# Patient Record
Sex: Female | Born: 1940 | Race: White | Hispanic: No | Smoking: Never smoker
Health system: Southern US, Community
[De-identification: ages and names within clinical notes are randomized; demographics above are authoritative.]

## PROBLEM LIST (undated history)

## (undated) DIAGNOSIS — M51369 Other intervertebral disc degeneration, lumbar region without mention of lumbar back pain or lower extremity pain: Secondary | ICD-10-CM

## (undated) DIAGNOSIS — M5136 Other intervertebral disc degeneration, lumbar region: Secondary | ICD-10-CM

## (undated) DIAGNOSIS — M8580 Other specified disorders of bone density and structure, unspecified site: Secondary | ICD-10-CM

## (undated) DIAGNOSIS — E785 Hyperlipidemia, unspecified: Secondary | ICD-10-CM

## (undated) DIAGNOSIS — R55 Syncope and collapse: Secondary | ICD-10-CM

## (undated) DIAGNOSIS — I10 Essential (primary) hypertension: Secondary | ICD-10-CM

## (undated) HISTORY — DX: Hyperlipidemia, unspecified: E78.5

## (undated) HISTORY — DX: Other specified disorders of bone density and structure, unspecified site: M85.80

## (undated) HISTORY — DX: Other intervertebral disc degeneration, lumbar region without mention of lumbar back pain or lower extremity pain: M51.369

## (undated) HISTORY — DX: Syncope and collapse: R55

## (undated) HISTORY — PX: ABDOMINAL HYSTERECTOMY: SHX81

## (undated) HISTORY — DX: Other intervertebral disc degeneration, lumbar region: M51.36

---

## 1968-08-11 HISTORY — PX: BACK SURGERY: SHX140

## 1999-08-12 HISTORY — PX: COLONOSCOPY: SHX174

## 1999-08-12 LAB — HM COLONOSCOPY: HM Colonoscopy: NEGATIVE

## 2004-03-07 ENCOUNTER — Encounter: Admission: RE | Admit: 2004-03-07 | Discharge: 2004-03-07 | Payer: Self-pay | Admitting: Internal Medicine

## 2004-11-14 ENCOUNTER — Ambulatory Visit: Payer: Self-pay | Admitting: Internal Medicine

## 2004-12-16 ENCOUNTER — Ambulatory Visit: Payer: Self-pay | Admitting: Internal Medicine

## 2005-01-10 ENCOUNTER — Ambulatory Visit: Payer: Self-pay | Admitting: Internal Medicine

## 2005-05-09 ENCOUNTER — Ambulatory Visit: Payer: Self-pay | Admitting: Internal Medicine

## 2005-06-02 ENCOUNTER — Encounter: Admission: RE | Admit: 2005-06-02 | Discharge: 2005-06-02 | Payer: Self-pay | Admitting: Internal Medicine

## 2005-06-17 ENCOUNTER — Ambulatory Visit: Payer: Self-pay | Admitting: Internal Medicine

## 2006-06-04 ENCOUNTER — Encounter: Admission: RE | Admit: 2006-06-04 | Discharge: 2006-06-04 | Payer: Self-pay | Admitting: Internal Medicine

## 2006-06-04 LAB — HM MAMMOGRAPHY: HM Mammogram: NORMAL

## 2006-06-23 ENCOUNTER — Ambulatory Visit: Payer: Self-pay | Admitting: Internal Medicine

## 2006-06-25 ENCOUNTER — Ambulatory Visit: Payer: Self-pay | Admitting: Internal Medicine

## 2006-06-25 LAB — CONVERTED CEMR LAB
BUN: 14 mg/dL (ref 6–23)
Basophils Absolute: 0 10*3/uL (ref 0.0–0.1)
Basophils Relative: 0.7 % (ref 0.0–1.0)
Calcium: 9.3 mg/dL (ref 8.4–10.5)
Creatinine, Ser: 1 mg/dL (ref 0.4–1.2)
Eosinophil percent: 2.8 % (ref 0.0–5.0)
Folate: 14.4 ng/mL
Glucose, Bld: 109 mg/dL — ABNORMAL HIGH (ref 70–99)
HCT: 38.9 % (ref 36.0–46.0)
Hemoglobin: 13.5 g/dL (ref 12.0–15.0)
Iron: 61 ug/dL (ref 42–145)
Lymphocytes Relative: 32.5 % (ref 12.0–46.0)
MCHC: 34.6 g/dL (ref 30.0–36.0)
MCV: 92 fL (ref 78.0–100.0)
Magnesium: 2 mg/dL (ref 1.5–2.5)
Monocytes Absolute: 0.6 10*3/uL (ref 0.2–0.7)
Monocytes Relative: 9.1 % (ref 3.0–11.0)
Neutro Abs: 3.3 10*3/uL (ref 1.4–7.7)
Neutrophils Relative %: 54.9 % (ref 43.0–77.0)
Platelets: 366 10*3/uL (ref 150–400)
Potassium: 4.2 meq/L (ref 3.5–5.1)
RBC: 4.23 M/uL (ref 3.87–5.11)
RDW: 11.9 % (ref 11.5–14.6)
Saturation Ratios: 21.2 % (ref 20.0–50.0)
Sodium: 142 meq/L (ref 135–145)
Transferrin: 205.7 mg/dL — ABNORMAL LOW (ref >=212.0)
Vitamin B-12: 541 pg/mL (ref 211–911)
WBC: 6.1 10*3/uL (ref 4.5–10.5)

## 2006-10-08 ENCOUNTER — Ambulatory Visit: Payer: Self-pay | Admitting: Internal Medicine

## 2006-10-13 ENCOUNTER — Encounter: Admission: RE | Admit: 2006-10-13 | Discharge: 2006-10-13 | Payer: Self-pay | Admitting: Internal Medicine

## 2006-12-09 ENCOUNTER — Encounter: Admission: RE | Admit: 2006-12-09 | Discharge: 2006-12-24 | Payer: Self-pay | Admitting: Neurosurgery

## 2007-06-18 ENCOUNTER — Telehealth: Payer: Self-pay | Admitting: Internal Medicine

## 2007-07-05 ENCOUNTER — Encounter: Admission: RE | Admit: 2007-07-05 | Discharge: 2007-07-05 | Payer: Self-pay | Admitting: Internal Medicine

## 2007-07-05 ENCOUNTER — Encounter: Payer: Self-pay | Admitting: Internal Medicine

## 2007-07-05 ENCOUNTER — Ambulatory Visit: Payer: Self-pay | Admitting: Internal Medicine

## 2007-07-14 ENCOUNTER — Telehealth (INDEPENDENT_AMBULATORY_CARE_PROVIDER_SITE_OTHER): Payer: Self-pay | Admitting: *Deleted

## 2007-07-19 DIAGNOSIS — E785 Hyperlipidemia, unspecified: Secondary | ICD-10-CM | POA: Insufficient documentation

## 2007-07-19 DIAGNOSIS — M8580 Other specified disorders of bone density and structure, unspecified site: Secondary | ICD-10-CM | POA: Insufficient documentation

## 2007-07-22 ENCOUNTER — Encounter (INDEPENDENT_AMBULATORY_CARE_PROVIDER_SITE_OTHER): Payer: Self-pay | Admitting: *Deleted

## 2007-07-29 ENCOUNTER — Telehealth: Payer: Self-pay | Admitting: Internal Medicine

## 2007-12-17 ENCOUNTER — Telehealth (INDEPENDENT_AMBULATORY_CARE_PROVIDER_SITE_OTHER): Payer: Self-pay | Admitting: *Deleted

## 2008-08-03 ENCOUNTER — Encounter (INDEPENDENT_AMBULATORY_CARE_PROVIDER_SITE_OTHER): Payer: Self-pay | Admitting: *Deleted

## 2008-10-24 ENCOUNTER — Telehealth (INDEPENDENT_AMBULATORY_CARE_PROVIDER_SITE_OTHER): Payer: Self-pay | Admitting: *Deleted

## 2009-01-24 ENCOUNTER — Telehealth (INDEPENDENT_AMBULATORY_CARE_PROVIDER_SITE_OTHER): Payer: Self-pay | Admitting: *Deleted

## 2009-04-02 ENCOUNTER — Telehealth (INDEPENDENT_AMBULATORY_CARE_PROVIDER_SITE_OTHER): Payer: Self-pay | Admitting: *Deleted

## 2009-05-24 ENCOUNTER — Telehealth (INDEPENDENT_AMBULATORY_CARE_PROVIDER_SITE_OTHER): Payer: Self-pay | Admitting: *Deleted

## 2009-06-07 ENCOUNTER — Ambulatory Visit: Payer: Self-pay | Admitting: Internal Medicine

## 2009-06-07 DIAGNOSIS — G2581 Restless legs syndrome: Secondary | ICD-10-CM | POA: Insufficient documentation

## 2009-06-07 DIAGNOSIS — R739 Hyperglycemia, unspecified: Secondary | ICD-10-CM | POA: Insufficient documentation

## 2009-06-14 ENCOUNTER — Ambulatory Visit: Payer: Self-pay | Admitting: Internal Medicine

## 2009-06-15 ENCOUNTER — Encounter (INDEPENDENT_AMBULATORY_CARE_PROVIDER_SITE_OTHER): Payer: Self-pay | Admitting: *Deleted

## 2009-08-01 ENCOUNTER — Encounter: Payer: Self-pay | Admitting: Internal Medicine

## 2009-08-01 ENCOUNTER — Ambulatory Visit: Payer: Self-pay | Admitting: Family Medicine

## 2009-08-15 ENCOUNTER — Ambulatory Visit: Payer: Self-pay | Admitting: Internal Medicine

## 2009-08-15 DIAGNOSIS — M7580 Other shoulder lesions, unspecified shoulder: Secondary | ICD-10-CM

## 2009-08-15 DIAGNOSIS — M25819 Other specified joint disorders, unspecified shoulder: Secondary | ICD-10-CM | POA: Insufficient documentation

## 2009-08-20 ENCOUNTER — Encounter: Payer: Self-pay | Admitting: Internal Medicine

## 2009-08-23 ENCOUNTER — Encounter: Admission: RE | Admit: 2009-08-23 | Discharge: 2009-09-13 | Payer: Self-pay | Admitting: Sports Medicine

## 2009-09-28 ENCOUNTER — Ambulatory Visit: Payer: Self-pay | Admitting: Internal Medicine

## 2010-09-08 LAB — CONVERTED CEMR LAB
ALT: 27 units/L (ref 0–35)
AST: 25 units/L (ref 0–37)
Albumin: 4.1 g/dL (ref 3.5–5.2)
Alkaline Phosphatase: 68 units/L (ref 39–117)
BUN: 15 mg/dL (ref 6–23)
Basophils Absolute: 0 10*3/uL (ref 0.0–0.1)
Basophils Relative: 0.1 % (ref 0.0–3.0)
Bilirubin, Direct: 0.1 mg/dL (ref 0.0–0.3)
CO2: 27 meq/L (ref 19–32)
Calcium: 9.1 mg/dL (ref 8.4–10.5)
Chloride: 107 meq/L (ref 96–112)
Cholesterol: 227 mg/dL — ABNORMAL HIGH (ref 0–200)
Creatinine, Ser: 0.8 mg/dL (ref 0.4–1.2)
Direct LDL: 158.9 mg/dL
Eosinophils Absolute: 0.3 10*3/uL (ref 0.0–0.7)
Eosinophils Relative: 4.4 % (ref 0.0–5.0)
GFR calc non Af Amer: 75.52 mL/min (ref >60)
Glucose, Bld: 107 mg/dL — ABNORMAL HIGH (ref 70–99)
HCT: 38.8 % (ref 36.0–46.0)
HDL: 42.3 mg/dL (ref >39.00)
Hemoglobin: 13.5 g/dL (ref 12.0–15.0)
Hgb A1c MFr Bld: 5.7 % (ref 4.6–6.5)
Lymphocytes Relative: 27.3 % (ref 12.0–46.0)
Lymphs Abs: 1.8 10*3/uL (ref 0.7–4.0)
MCHC: 34.9 g/dL (ref 30.0–36.0)
MCV: 93.1 fL (ref 78.0–100.0)
Monocytes Absolute: 0.4 10*3/uL (ref 0.1–1.0)
Monocytes Relative: 6.5 % (ref 3.0–12.0)
Neutro Abs: 4 10*3/uL (ref 1.4–7.7)
Neutrophils Relative %: 61.7 % (ref 43.0–77.0)
Platelets: 305 10*3/uL (ref 150.0–400.0)
Potassium: 3.8 meq/L (ref 3.5–5.1)
RBC: 4.16 M/uL (ref 3.87–5.11)
RDW: 11.9 % (ref 11.5–14.6)
Sodium: 141 meq/L (ref 135–145)
TSH: 2.41 microintl units/mL (ref 0.35–5.50)
Total Bilirubin: 0.8 mg/dL (ref 0.3–1.2)
Total CHOL/HDL Ratio: 5
Total Protein: 7.3 g/dL (ref 6.0–8.3)
Triglycerides: 158 mg/dL — ABNORMAL HIGH (ref 0.0–149.0)
VLDL: 31.6 mg/dL (ref 0.0–40.0)
Vit D, 25-Hydroxy: 25 ng/mL — ABNORMAL LOW (ref 30–89)
WBC: 6.5 10*3/uL (ref 4.5–10.5)

## 2010-09-12 NOTE — Consult Note (Signed)
Summary: [facility]  [facility]   Imported By: Lanelle Bal 09/11/2009 09:47:24  _____________________________________________________________________  External Attachment:    Type:   Image     Comment:   External Document

## 2010-09-12 NOTE — Assessment & Plan Note (Signed)
Summary: PAIN IN SHOULDER/KDC   Vital Signs:  Patient profile:   70 year old female Weight:      150.6 pounds Temp:     98.3 degrees F oral Pulse rate:   72 / minute Resp:     14 per minute BP sitting:   118 / 70  (left arm) Cuff size:   regular  Vitals Entered By: Shonna Chock (August 15, 2009 12:14 PM) CC: Pain in left shoulder x 3 weeks, no known injury Comments REVIEWED MED LIST, PATIENT AGREED DOSE AND INSTRUCTION CORRECT    CC:  Pain in left shoulder x 3 weeks and no known injury.  History of Present Illness: Acute pain R supra  clavicular area & occasionally into  R biceps simply after rotating trunk to R 3 weeks ago. She sensed 3 "pops" with event. Now pain is throbbing to sharp  worse with RUE hanging down; better with RUE flexed & elevated. Tylenol & Advil of no benefit. Initially topical ice &  anti inflammatory cream helped some.  Allergies: 1)  ! Sulfa  Review of Systems General:  Denies chills, fever, and sweats. MS:  See HPI; Denies joint redness, joint swelling, and muscle weakness. Derm:  Denies lesion(s) and rash. Neuro:  Denies poor balance and tingling.  Physical Exam  General:  well-nourished,in no acute distress; alert,appropriate and cooperative throughout examination' mildly uncomfortable-appearing.   Neck:  Full ROM  Extremities:  Crepitus  , decreased ROM  posteriorly  & pain to oppostion L shoulder  Neurologic:  alert & oriented X3, strength normal in all extremities, and DTRs symmetrical and normal.   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 Take 1 tablet by mouth daily as directed 2)  Ropinirole Hcl 4 Mg Tabs (Ropinirole hcl)  .Marland Kitchen.. 1 by mouth qhs 3)  Pravastatin Sodium 20 Mg Tabs (Pravastatin sodium) .Marland Kitchen.. 1 at bedtime 4)  Tramadol Hcl 50 Mg Tabs (Tramadol hcl) .Marland Kitchen.. 1 q 6 hrs as needed for pain  Patient Instructions: 1)  Glucosamine sulfate 1500 mg once daily until seen. Prescriptions: TRAMADOL HCL 50 MG TABS (TRAMADOL HCL) 1 q 6 hrs as needed for pain  #30 x 1   Entered and Authorized by:   [name] MD   Signed by:   [name] MD on 08/15/2009   Method used:   [facility]ed to ...       [facility] [address] [phone]* (retail)       [id number]       [place], [place]  [address]       Ph: [phone] or [phone]       Fax: [phone]   RxID:   [id number]

## 2010-12-19 ENCOUNTER — Other Ambulatory Visit: Payer: Self-pay

## 2010-12-19 MED ORDER — ROPINIROLE HCL 4 MG PO TABS: 4.0000 mg | ORAL_TABLET | Freq: Every day | ORAL | Status: DC

## 2010-12-27 NOTE — Assessment & Plan Note (Signed)
[facility]                        [facility] OFFICE NOTE   [name], [name]                       MRN:          [id number]  DATE:10/20/2006                            DOB:          1940/08/23    Andrea Kelley has had radicular symptoms  possiblyin  L2 but definitely  L5  distribution for over 2 months.  This is manifested as left calf and  thigh pain almost every day.  The duration is 30 minutes or more.  Aspirin and Aleve are not of benefit, and pain can be severe at times.  The discomfort in the hip is always present, but she may have left calf  discomfort simultaneously on occasion.  Her symptoms initially involved  the hip and back and were paroxysmal.  She does definitely feel it is  worse using a rebounder and in certain positions.   PAST MEDICAL HISTORY:  1. Total abdominal hysterectomy for dysmenorrhea.  2. She has had back surgery for ruptured disc in 1970.  3. Colonoscopy in 2001 was negative.  This was done for rectal      bleeding.  4. She had a fracture of her foot in 1985.  5. She has osteopenia and dyslipidemia.   FAMILY HISTORY:  Positive for breast cancer, heart attack, stroke, and  lung cancer.  She has never smoked, and drinks socially.  She is  intolerant or allergic to SULFA.   She is presently on enteric coated aspirin, and Requip for restless leg  syndrome.   She has had a purposeful weight loss of 7 pounds to 141.  She had  positive straight leg raising on the left.  Deep tendon reflexes were  normal.  There was no muscular atrophy or weakness.   MRI shows severe spinal stenosis at L4-L5, with a small disc herniation  at the same location.  She has scarring, recurrent disc bulging, and  spurring at L5-S1, narrowing lateral recesses, with some scarring around  the right S1 nerve root.  There is also narrowing on the left greater  than the right which could affect the left S1 nerve root.   Andrea Kelley is an  extremely stoic, motivated individual; I would recommend  neurosurgery consultation.     Titus Dubin. Alwyn Ren, MD,FACP,FCCP  Electronically Signed    [facility]  DD: 10/20/2006  DT: 10/20/2006  Job #: [id number]

## 2011-01-15 ENCOUNTER — Encounter: Payer: Self-pay | Admitting: Internal Medicine

## 2011-01-15 ENCOUNTER — Ambulatory Visit (INDEPENDENT_AMBULATORY_CARE_PROVIDER_SITE_OTHER): Payer: Medicare Other | Admitting: Internal Medicine

## 2011-01-15 VITALS — BP 130/94 | HR 64 | Temp 98.4°F | Resp 12 | Ht 65.75 in | Wt 147.2 lb

## 2011-01-15 DIAGNOSIS — M5136 Other intervertebral disc degeneration, lumbar region: Secondary | ICD-10-CM

## 2011-01-15 DIAGNOSIS — M899 Disorder of bone, unspecified: Secondary | ICD-10-CM

## 2011-01-15 DIAGNOSIS — M949 Disorder of cartilage, unspecified: Secondary | ICD-10-CM

## 2011-01-15 DIAGNOSIS — Z0000 Encounter for general adult medical examination without abnormal findings: Secondary | ICD-10-CM

## 2011-01-15 DIAGNOSIS — E785 Hyperlipidemia, unspecified: Secondary | ICD-10-CM

## 2011-01-15 DIAGNOSIS — G2581 Restless legs syndrome: Secondary | ICD-10-CM

## 2011-01-15 NOTE — Progress Notes (Signed)
Subjective:    Patient ID: Andrea Kelley, female    DOB: March 03, 1941, 70 y.o.   MRN: [id number]  HPI Medicare Wellness Visit:  The following psychosocial & medical history were reviewed as required by Medicare.   Social history: caffeine: 1-1.5 cups of coffee , alcohol:  1-2 / day ,  tobacco use : never  & exercise : 3X/ week & walking daily.   Home & personal  safety / fall risk: no issues, activities of daily living: no limitations , seatbelt use : yes , and smoke alarm employment : yes .  Power of Attorney/Lving Will status : in place  Vision ( as recorded per Nurse) & Hearing  evaluation :  Whisper heard @ 6 ft. Orientation :oriented X3 , memory & recall :good , spelling or math testing: good,and mood & affect : normal . Depression / anxiety: denied Travel history : [place] , immunization status :up to date , transfusion history:  ? As child  Post T&A, and preventive health surveillance ( colonoscopies, BMD , etc as per protocol/ [facility]): Colonoscopy was due 2011. Last Mammogram 2 years ago; no Gyn F/U. Dental care:  Seen every 6 mos . Chart reviewed &  Updated. Active issues reviewed & addressed.       Review of Systems Patient reports no vision/ hearing  changes, adenopathy,fever, weight change,  persistant / recurrent hoarseness , swallowing issues, chest pain,palpitations,edema,persistant /recurrent cough, hemoptysis, dyspnea( rest/ exertional/paroxysmal nocturnal), gastrointestinal bleeding(melena, rectal bleeding), abdominal pain, significant heartburn,  bowel changes,GU symptoms(dysuria, hematuria,pyuria, incontinence) ), Gyn symptoms(abnormal  bleeding , pain),  syncope, focal weakness, memory loss,numbness & tingling, skin/hair /nail changes,abnormal bruising or bleeding.     Objective:   Physical Exam Gen.: Healthy and well-nourished in appearance. Alert, appropriate and cooperative throughout exam. Head: Normocephalic without obvious abnormalities  Eyes: No corneal or  conjunctival inflammation noted. Pupils equal round reactive to light and accommodation. Fundal exam is benign without hemorrhages, exudate, papilledema. Extraocular motion intact. Vision grossly normal. Ears: External  ear exam reveals no significant lesions or deformities. Canals clear .TMs normal. Hearing is grossly normal bilaterally. Nose: External nasal exam reveals no deformity or inflammation. Nasal mucosa are pink and moist. No lesions or exudates noted.Mouth: Oral mucosa and oropharynx reveal no lesions or exudates. Teeth in good repair. Neck: No deformities, masses, or tenderness noted. Range of motion &. Thyroid normal. Lungs: Normal respiratory effort; chest expands symmetrically. Lungs are clear to auscultation without rales, wheezes, or increased work of breathing. Heart: Normal rate and rhythm. Normal S1 and S2. No gallop, click, or rub. S4 w/o murmur. Abdomen: Bowel sounds normal; abdomen soft and nontender. No masses, organomegaly or hernias noted. Genitalia: Gyn recommended                                                                                      Musculoskeletal/extremities: No deformity or scoliosis noted of  the thoracic or lumbar spine. No clubbing, cyanosis, edema, or deformity noted. Range of motion  normal .Tone & strength  normal.Joints normal. Nail health  good. Vascular: Carotid, radial artery, dorsalis pedis and dorsalis posterior tibial pulses are full and equal. No bruits  present. Neurologic: Alert and oriented x3. Deep tendon reflexes symmetrical and normal.         Skin: Intact without suspicious lesions or rashes. Lymph: No cervical, axillary, or inguinal lymphadenopathy present. Psych: Mood and affect are normal. Normally interactive                                                                                         Assessment & Plan:  #1 Medicare Wellness Exam;see  data entry #RLS,uncontrolled See Orders & recommendations

## 2011-01-15 NOTE — Assessment & Plan Note (Signed)
Plan: check for iron deficiency  anemia, vit D level

## 2011-01-15 NOTE — Assessment & Plan Note (Signed)
Due in 6 months

## 2011-01-15 NOTE — Patient Instructions (Signed)
Complete stool cards;please  schedule fasting Labs : BMET,Lipids, hepatic panel, CBC & dif, TSH,  , vitamin D level.  Med changes will be made based on results

## 2011-01-15 NOTE — Assessment & Plan Note (Signed)
LDL goal <100 ° °

## 2011-01-16 ENCOUNTER — Other Ambulatory Visit (INDEPENDENT_AMBULATORY_CARE_PROVIDER_SITE_OTHER): Payer: Medicare Other

## 2011-01-16 ENCOUNTER — Encounter: Payer: Self-pay | Admitting: Internal Medicine

## 2011-01-16 DIAGNOSIS — E785 Hyperlipidemia, unspecified: Secondary | ICD-10-CM

## 2011-01-16 DIAGNOSIS — M949 Disorder of cartilage, unspecified: Secondary | ICD-10-CM

## 2011-01-16 DIAGNOSIS — G2581 Restless legs syndrome: Secondary | ICD-10-CM

## 2011-01-16 DIAGNOSIS — M899 Disorder of bone, unspecified: Secondary | ICD-10-CM

## 2011-01-16 DIAGNOSIS — M5136 Other intervertebral disc degeneration, lumbar region: Secondary | ICD-10-CM | POA: Insufficient documentation

## 2011-01-16 DIAGNOSIS — Z0000 Encounter for general adult medical examination without abnormal findings: Secondary | ICD-10-CM

## 2011-01-16 LAB — HEPATIC FUNCTION PANEL
ALT: 21 U/L (ref 0–35)
AST: 19 U/L (ref 0–37)
Albumin: 4.1 g/dL (ref 3.5–5.2)
Alkaline Phosphatase: 78 U/L (ref 39–117)
Bilirubin, Direct: 0 mg/dL (ref 0.0–0.3)
Total Bilirubin: 0 mg/dL — ABNORMAL LOW (ref 0.3–1.2)
Total Protein: 7 g/dL (ref 6.0–8.3)

## 2011-01-16 LAB — BASIC METABOLIC PANEL
BUN: 20 mg/dL (ref 6–23)
CO2: 23 mEq/L (ref 19–32)
Calcium: 8.6 mg/dL (ref 8.4–10.5)
Chloride: 103 mEq/L (ref 96–112)
Creatinine, Ser: 0.8 mg/dL (ref 0.4–1.2)
GFR: 74.1 mL/min (ref >60.00)
Glucose, Bld: 101 mg/dL — ABNORMAL HIGH (ref 70–99)
Potassium: 4.6 mEq/L (ref 3.5–5.1)
Sodium: 140 mEq/L (ref 135–145)

## 2011-01-16 LAB — CBC WITH DIFFERENTIAL/PLATELET
Basophils Absolute: 0.1 10*3/uL (ref 0.0–0.1)
Basophils Relative: 0.7 % (ref 0.0–3.0)
Eosinophils Absolute: 0.2 10*3/uL (ref 0.0–0.7)
Eosinophils Relative: 3.1 % (ref 0.0–5.0)
HCT: 39.9 % (ref 36.0–46.0)
Hemoglobin: 13.9 g/dL (ref 12.0–15.0)
Lymphocytes Relative: 41.2 % (ref 12.0–46.0)
Lymphs Abs: 3.1 10*3/uL (ref 0.7–4.0)
MCHC: 34.8 g/dL (ref 30.0–36.0)
MCV: 92.7 fl (ref 78.0–100.0)
Monocytes Absolute: 0.5 10*3/uL (ref 0.1–1.0)
Monocytes Relative: 6.7 % (ref 3.0–12.0)
Neutro Abs: 3.6 10*3/uL (ref 1.4–7.7)
Neutrophils Relative %: 48.3 % (ref 43.0–77.0)
Platelets: 315 10*3/uL (ref 150.0–400.0)
RBC: 4.3 Mil/uL (ref 3.87–5.11)
RDW: 12.7 % (ref 11.5–14.6)
WBC: 7.5 10*3/uL (ref 4.5–10.5)

## 2011-01-16 LAB — LDL CHOLESTEROL, DIRECT: Direct LDL: 172.6 mg/dL

## 2011-01-16 LAB — LIPID PANEL
Cholesterol: 253 mg/dL — ABNORMAL HIGH (ref 0–200)
HDL: 48.3 mg/dL (ref >39.00)
Total CHOL/HDL Ratio: 5
Triglycerides: 181 mg/dL — ABNORMAL HIGH (ref 0.0–149.0)
VLDL: 36.2 mg/dL (ref 0.0–40.0)

## 2011-01-16 LAB — TSH: TSH: 1.82 u[IU]/mL (ref 0.35–5.50)

## 2011-01-17 LAB — VITAMIN D 25 HYDROXY (VIT D DEFICIENCY, FRACTURES): Vit D, 25-Hydroxy: 33 ng/mL (ref 30–89)

## 2011-01-20 ENCOUNTER — Other Ambulatory Visit: Payer: Self-pay

## 2011-01-20 MED ORDER — PRAVASTATIN SODIUM 20 MG PO TABS: 20.0000 mg | ORAL_TABLET | Freq: Every evening | ORAL | Status: DC

## 2011-01-21 LAB — HEMOGLOBIN A1C: Hgb A1c MFr Bld: 6.2 % (ref 4.6–6.5)

## 2011-01-24 ENCOUNTER — Other Ambulatory Visit (INDEPENDENT_AMBULATORY_CARE_PROVIDER_SITE_OTHER): Payer: Medicare Other

## 2011-01-24 DIAGNOSIS — Z1211 Encounter for screening for malignant neoplasm of colon: Secondary | ICD-10-CM

## 2011-01-24 LAB — HEMOCCULT GUIAC POC 1CARD (OFFICE)
Card #2 Fecal Occult Blod, POC: NEGATIVE
Card #3 Fecal Occult Blood, POC: NEGATIVE
Fecal Occult Blood, POC: NEGATIVE

## 2011-01-24 NOTE — Progress Notes (Signed)
Labs only

## 2011-03-17 ENCOUNTER — Other Ambulatory Visit: Payer: Self-pay

## 2011-03-17 NOTE — Telephone Encounter (Signed)
Discussed with patient and she stated she needed the increase to 5 mg Ropinole because the 4 mg is not working.  Please advise   KP

## 2011-03-17 NOTE — Telephone Encounter (Signed)
Mssg from patient stating she is currently taking 4 mg of Ropinole and she would like to increase to 5 mg and would like [facility] to [facility].

## 2011-03-18 MED ORDER — ROPINIROLE HCL 5 MG PO TABS: 5.0000 mg | ORAL_TABLET | Freq: Every day | ORAL | Status: DC

## 2011-03-18 NOTE — Telephone Encounter (Signed)
Spoke w/ pt per Hop ok to have 5mg  tablet look in another reference and saw that medication

## 2011-03-18 NOTE — Telephone Encounter (Signed)
Maximum dose of the ropinirole is 4 mg. The only other approved  is generic Mirapex (Pramipexole) This starts at 0.125 mg 3 hours before bedtime ;it  can be increased by one pill every week up to a max of 0.5 mg

## 2011-05-08 ENCOUNTER — Other Ambulatory Visit: Payer: Self-pay | Admitting: Internal Medicine

## 2011-05-09 MED ORDER — PRAVASTATIN SODIUM 20 MG PO TABS: 20.0000 mg | ORAL_TABLET | Freq: Every evening | ORAL | Status: DC

## 2011-05-09 NOTE — Telephone Encounter (Signed)
RX sent

## 2011-07-14 ENCOUNTER — Other Ambulatory Visit: Payer: Self-pay | Admitting: Internal Medicine

## 2011-11-09 ENCOUNTER — Other Ambulatory Visit: Payer: Self-pay | Admitting: Internal Medicine

## 2011-11-10 NOTE — Telephone Encounter (Signed)
Refill done.  

## 2012-01-07 ENCOUNTER — Other Ambulatory Visit: Payer: Self-pay | Admitting: Internal Medicine

## 2012-01-07 NOTE — Telephone Encounter (Signed)
Patient due for CPX in June 2013

## 2012-02-17 ENCOUNTER — Other Ambulatory Visit: Payer: Self-pay | Admitting: Internal Medicine

## 2012-02-17 NOTE — Telephone Encounter (Signed)
Patient needs to schedule a CPX  

## 2012-03-08 ENCOUNTER — Other Ambulatory Visit: Payer: Self-pay | Admitting: Internal Medicine

## 2012-04-07 ENCOUNTER — Other Ambulatory Visit: Payer: Self-pay | Admitting: Internal Medicine

## 2012-05-10 ENCOUNTER — Other Ambulatory Visit: Payer: Self-pay | Admitting: Internal Medicine

## 2012-05-20 ENCOUNTER — Other Ambulatory Visit: Payer: Self-pay | Admitting: Internal Medicine

## 2012-06-08 ENCOUNTER — Other Ambulatory Visit: Payer: Self-pay | Admitting: Internal Medicine

## 2012-06-09 ENCOUNTER — Other Ambulatory Visit: Payer: Self-pay | Admitting: Internal Medicine

## 2012-06-09 NOTE — Telephone Encounter (Signed)
Patient needs to schedule a CPX  

## 2012-06-16 ENCOUNTER — Ambulatory Visit (INDEPENDENT_AMBULATORY_CARE_PROVIDER_SITE_OTHER): Payer: Medicare Other | Admitting: Internal Medicine

## 2012-06-16 ENCOUNTER — Encounter: Payer: Self-pay | Admitting: Internal Medicine

## 2012-06-16 VITALS — BP 122/80 | HR 68 | Wt 150.0 lb

## 2012-06-16 DIAGNOSIS — M25569 Pain in unspecified knee: Secondary | ICD-10-CM

## 2012-06-16 DIAGNOSIS — G2581 Restless legs syndrome: Secondary | ICD-10-CM

## 2012-06-16 DIAGNOSIS — E785 Hyperlipidemia, unspecified: Secondary | ICD-10-CM

## 2012-06-16 DIAGNOSIS — M1710 Unilateral primary osteoarthritis, unspecified knee: Secondary | ICD-10-CM

## 2012-06-16 DIAGNOSIS — Z0000 Encounter for general adult medical examination without abnormal findings: Secondary | ICD-10-CM

## 2012-06-16 DIAGNOSIS — M899 Disorder of bone, unspecified: Secondary | ICD-10-CM

## 2012-06-16 DIAGNOSIS — IMO0002 Reserved for concepts with insufficient information to code with codable children: Secondary | ICD-10-CM

## 2012-06-16 DIAGNOSIS — R7309 Other abnormal glucose: Secondary | ICD-10-CM

## 2012-06-16 DIAGNOSIS — M179 Osteoarthritis of knee, unspecified: Secondary | ICD-10-CM

## 2012-06-16 DIAGNOSIS — Z23 Encounter for immunization: Secondary | ICD-10-CM

## 2012-06-16 DIAGNOSIS — M949 Disorder of cartilage, unspecified: Secondary | ICD-10-CM

## 2012-06-16 LAB — HEPATIC FUNCTION PANEL
ALT: 23 U/L (ref 0–35)
AST: 21 U/L (ref 0–37)
Bilirubin, Direct: 0.1 mg/dL (ref 0.0–0.3)
Total Protein: 7.2 g/dL (ref 6.0–8.3)

## 2012-06-16 LAB — LIPID PANEL
Cholesterol: 132 mg/dL (ref 0–200)
Total CHOL/HDL Ratio: 3
Triglycerides: 95 mg/dL (ref 0.0–149.0)

## 2012-06-16 LAB — BASIC METABOLIC PANEL
BUN: 14 mg/dL (ref 6–23)
CO2: 24 mEq/L (ref 19–32)
Chloride: 107 mEq/L (ref 96–112)
Creatinine, Ser: 0.9 mg/dL (ref 0.4–1.2)
Potassium: 4.3 mEq/L (ref 3.5–5.1)

## 2012-06-16 MED ORDER — PRAVASTATIN SODIUM 20 MG PO TABS: 20.0000 mg | ORAL_TABLET | Freq: Every day | ORAL | Status: DC

## 2012-06-16 MED ORDER — ROPINIROLE HCL 5 MG PO TABS: 5.0000 mg | ORAL_TABLET | Freq: Every day | ORAL | Status: DC

## 2012-06-16 NOTE — Patient Instructions (Addendum)
Preventive Health Care: Exercise  30-45  minutes a day, 3-4 days a week. Walking is especially valuable in preventing Osteoporosis. Eat a low-fat diet with lots of fruits and vegetables, up to 7-9 servings per day.  Consume less than 30 grams of sugar per day from foods & drinks with High Fructose Corn Syrup as #1,2,3 or #4 on label. Use an anti-inflammatory cream such as Aspercreme or Zostrix cream twice a day to the left knee as needed. In lieu of this warm moist compresses or  hot water bottle can be used. Do not apply ice to the knee.  As per the Standard of Care , screening Colonoscopy recommended @ 50 & every 5-10 years thereafter . More frequent monitor would be dictated by family history or findings @ Colonoscopy  If you activate My Chart; the results can be released to you as soon as they populate from the lab. If you choose not to use this program; the labs have to be reviewed, copied & mailed   causing a delay in getting the results to you.

## 2012-06-16 NOTE — Progress Notes (Signed)
Subjective:    Patient ID: Andrea Kelley, female    DOB: 06-Aug-1941, 71 y.o.   MRN: [id number]  HPI Medicare Wellness Visit:  The following psychosocial & medical history were reviewed as required by Medicare.   Social history: caffeine: 1 cup / day , alcohol:  7-14 glasses of wine / week ,  tobacco use : never  & exercise :CURVES & walking > 3X/ week.   Home & personal  safety / fall risk: no issues, activities of daily living: no limitations , seatbelt use : yes , and smoke alarm employment : yes .  Power of Attorney/Living Will status : in place  Vision ( as recorded per Nurse) & Hearing  evaluation : Ophth exam 2012; no hearing exam. Orientation :oriented X 3 , memory & recall :good,  math testing: good,and mood & affect : normal . Depression / anxiety: denied Travel history : 5/13 [place] , immunization status :up to date , transfusion history: no, and preventive health surveillance ( colonoscopies, BMD , etc as per protocol/ [facility]): last colonoscopy 2001([facility]), Dental care: every 6 mos . Chart reviewed &  Updated. Active issues reviewed & addressed.       Review of Systems  She is on a heart healthy diet; exercise is delineated above. She has no associated chest pain, palpitations, dyspnea, or claudication with exercise. She's been compliant with her statin. She denies abdominal pain, bowel change, melena, or rectal bleeding.  She has intermittent pain in the left knee; triggers are flexion or squatting. She's taken Aleve as needed on occasion     Objective:   Physical Exam Gen.: Healthy and well-nourished in appearance. Alert, appropriate and cooperative throughout exam.Appears younger than stated age  Head: Normocephalic without obvious abnormalities Eyes: No corneal or conjunctival inflammation noted. Pupils equal round reactive to light and accommodation. Fundal exam is benign without hemorrhages, exudate, papilledema. Extraocular motion intact. Vision grossly normal. Ears:  External  ear exam reveals no significant lesions or deformities. Canals clear .TMs normal. Hearing is grossly normal bilaterally. Nose: External nasal exam reveals no deformity or inflammation. Nasal mucosa are pink and moist. No lesions or exudates noted.  Mouth: Oral mucosa and oropharynx reveal no lesions or exudates. Teeth in good repair. Neck: No deformities, masses, or tenderness noted. Range of motion & Thyroid normal. Lungs: Normal respiratory effort; chest expands symmetrically. Lungs are clear to auscultation without rales, wheezes, or increased work of breathing. Heart: Normal rate and rhythm. Normal S1 and S2. No gallop, click, or rub. S4 w/o murmur. Abdomen: Bowel sounds normal; abdomen soft and nontender. No masses, organomegaly or hernias noted. Genitalia: Gyn F/U recommended Musculoskeletal/extremities: No deformity or scoliosis noted of  the thoracic or lumbar spine. No clubbing, cyanosis, edema, or deformity noted. Range of motion  normal .Tone & strength  normal.Joints normal. Nail health  good. She has bilateral crepitus of the knees, left greater than the right. No effusion present. Vascular: Carotid, radial artery, dorsalis pedis and  posterior tibial pulses are full and equal. No bruits present. Neurologic: Alert and oriented x3. Deep tendon reflexes symmetrical and normal.          Skin: Intact without suspicious lesions or rashes. Lymph: No cervical, axillary lymphadenopathy present. Psych: Mood and affect are normal. Normally interactive  Assessment & Plan:  #1 Medicare Wellness Exam; criteria met ; data entered #2 DJD knees Plan: see Orders

## 2012-06-21 LAB — VITAMIN D 1,25 DIHYDROXY
Vitamin D2 1, 25 (OH)2: <8 pg/mL
Vitamin D3 1, 25 (OH)2: 59 pg/mL

## 2012-06-22 ENCOUNTER — Other Ambulatory Visit: Payer: Medicare Other

## 2012-06-24 ENCOUNTER — Ambulatory Visit (INDEPENDENT_AMBULATORY_CARE_PROVIDER_SITE_OTHER)
Admission: RE | Admit: 2012-06-24 | Discharge: 2012-06-24 | Disposition: A | Discharge status: Home / Self Care | Payer: Medicare Other | Source: Ambulatory Visit | Attending: Internal Medicine | Admitting: Internal Medicine

## 2012-06-24 DIAGNOSIS — M899 Disorder of bone, unspecified: Secondary | ICD-10-CM

## 2012-06-24 DIAGNOSIS — M949 Disorder of cartilage, unspecified: Secondary | ICD-10-CM

## 2012-08-17 ENCOUNTER — Ambulatory Visit (HOSPITAL_BASED_OUTPATIENT_CLINIC_OR_DEPARTMENT_OTHER)
Admission: RE | Admit: 2012-08-17 | Discharge: 2012-08-17 | Disposition: A | Discharge status: Home / Self Care | Payer: Medicare Other | Source: Ambulatory Visit | Attending: Family | Admitting: Family

## 2012-08-17 ENCOUNTER — Encounter: Payer: Self-pay | Admitting: Family

## 2012-08-17 ENCOUNTER — Telehealth: Payer: Self-pay | Admitting: Internal Medicine

## 2012-08-17 ENCOUNTER — Ambulatory Visit (INDEPENDENT_AMBULATORY_CARE_PROVIDER_SITE_OTHER): Payer: Medicare Other | Admitting: Family

## 2012-08-17 VITALS — BP 100/78 | HR 74 | Temp 97.5°F | Resp 16 | Wt 155.1 lb

## 2012-08-17 DIAGNOSIS — M79609 Pain in unspecified limb: Secondary | ICD-10-CM | POA: Insufficient documentation

## 2012-08-17 DIAGNOSIS — M79672 Pain in left foot: Secondary | ICD-10-CM

## 2012-08-17 LAB — URIC ACID: Uric Acid, Serum: 7 mg/dL (ref 2.4–7.0)

## 2012-08-17 NOTE — Patient Instructions (Addendum)
Please complete your blood work prior to leaving.  Complete x ray of your foot on the first floor.  We will contact you with your results.

## 2012-08-17 NOTE — Telephone Encounter (Signed)
Patient Information:  Caller Name: Karleen  Phone: [phone]  Patient: Andrea Kelley, Andrea Kelley  Gender: Female  DOB: 1941-03-18  Age: 72 Years  PCP: [name]  Office Follow Up:  Does the office need to follow up with this patient?: Yes  Instructions For The Office: Needs appt. for today please   Symptoms  Reason For Call & Symptoms: Pain in left foot, outer area behind the 4th and 5th toe with swelling on top of the foot.  No known injury.  Reviewed Health History In EMR: Yes  Reviewed Medications In EMR: Yes  Reviewed Allergies In EMR: Yes  Reviewed Surgeries / Procedures: Yes  Date of Onset of Symptoms: 08/04/2012  Treatments Tried: Motrin at hs  Treatments Tried Worked: Yes  Guideline(s) Used:  Foot Pain  Disposition Per Guideline:   See Today in Office  Reason For Disposition Reached:   Swollen foot (EXCEPTIONs: localized bump from bunions, calluses, insect bite, sting)  Advice Given:  N/A

## 2012-08-17 NOTE — Assessment & Plan Note (Addendum)
Will obtain x ray to further evaluate and rule out fracture.  Obtain uric acid level to exclude gout- plan colchicine if elevated. Recommended short course of aleve.

## 2012-08-17 NOTE — Progress Notes (Signed)
  Subjective:    Patient ID: Andrea Kelley, female    DOB: 14-Dec-1940, 72 y.o.   MRN: [id number]  HPI  Andrea Kelley is a 72 yr old female who presents today with chief complaint of left foot pain. Pain has been present x 2 weeks.  Pain is located on the dorsal aspect of the left foot and is associated with swelling.  Pain is made worse by weight bearing.  She denies hx of injury or previous hx of gout.   Review of Systems See HPI  Past Medical History  Diagnosis Date  . Osteopenia     [place]  . DDD (degenerative disc disease), lumbar   . Hyperlipidemia     History   Social History  . Marital Status: Married    Spouse Name: N/A    Number of Children: N/A  . Years of Education: N/A   Occupational History  . Not on file.   Social History Main Topics  . Smoking status: Never Smoker   . Smokeless tobacco: Not on file  . Alcohol Use: Yes     Comment: Socially , 1-2 glasseswine / night  . Drug Use: No  . Sexually Active: Not on file   Other Topics Concern  . Not on file   Social History Narrative  . No narrative on file    Past Surgical History  Procedure Date  . Abdominal hysterectomy     For dysfunctional bleeding; no BSO  . Back surgery 1970    ruptured disc ; Dr Roxan Hockey  . Colonoscopy 2001    Negative; done for rectal bleeding    Family History  Problem Relation Age of Onset  . Lung cancer Father     smoker  . Heart attack Mother 84  . Stroke Mother     in 53s  . Breast cancer Maternal Aunt   . Diabetes Neg Hx     Allergies  Allergen Reactions  . Sulfonamide Derivatives     REACTION: rash as child    Current Outpatient Prescriptions on File Prior to Visit  Medication Sig Dispense Refill  . aspirin 81 MG tablet Take 81 mg by mouth daily.        . pravastatin (PRAVACHOL) 20 MG tablet Take 1 tablet (20 mg total) by mouth daily.  90 tablet  3  . ropinirole (REQUIP) 5 MG tablet Take 1 tablet (5 mg total) by mouth at bedtime.  30 tablet  11    . acetaminophen (TYLENOL) 325 MG tablet Take 650 mg by mouth as needed.          BP 100/78  Pulse 74  Temp 97.5 F (36.4 C) (Oral)  Resp 16  Wt 155 lb 1.9 oz (70.362 kg)  SpO2 98%       Objective:   Physical Exam  Constitutional: She is oriented to person, place, and time. She appears well-developed and well-nourished. No distress.  Musculoskeletal:       Left dorsal foot with slight discoloration- no significant swelling.  Mild tenderness to palpation overlying left 3rd proximal metatarsal.   Neurological: She is alert and oriented to person, place, and time.  Skin: Skin is warm and dry.  Psychiatric: She has a normal mood and affect. Her behavior is normal. Judgment and thought content normal.          Assessment & Plan:

## 2012-08-17 NOTE — Telephone Encounter (Signed)
I called patient, patient scheduled to see Sandford Craze at 3:15 [facility]), no available slots at our office. Patient was given address and I explained how to get to facility

## 2012-08-18 ENCOUNTER — Telehealth: Payer: Self-pay | Admitting: Family

## 2012-08-18 MED ORDER — COLCHICINE 0.6 MG PO TABS: ORAL_TABLET | ORAL | Status: DC

## 2012-08-18 NOTE — Telephone Encounter (Signed)
Please call pt and let her know that her x ray is normal.  Uric acid level- upper limit normal.  Gout is a possibility.  Colcrys sent to pharmacy to try- let her know that it may cause brief diarrhea.  She should take 2 tabs followed by 1 tab one hour later.  Can keep extra tabs on hand.

## 2012-08-18 NOTE — Telephone Encounter (Signed)
Notified pt and she voices understanding. 

## 2013-03-16 ENCOUNTER — Other Ambulatory Visit: Payer: Self-pay

## 2013-06-16 ENCOUNTER — Other Ambulatory Visit: Payer: Self-pay

## 2013-06-20 ENCOUNTER — Other Ambulatory Visit: Payer: Self-pay | Admitting: Internal Medicine

## 2013-06-20 NOTE — Telephone Encounter (Signed)
Ropinirole refilled #30, 0 refills. OV due

## 2013-07-15 ENCOUNTER — Other Ambulatory Visit: Payer: Self-pay | Admitting: Internal Medicine

## 2013-07-18 NOTE — Telephone Encounter (Signed)
Requip refilled per protocol

## 2013-08-15 ENCOUNTER — Other Ambulatory Visit: Payer: Self-pay | Admitting: Internal Medicine

## 2013-08-16 NOTE — Telephone Encounter (Signed)
Ropinirole refilled per protocol. JG//CMA

## 2013-09-08 ENCOUNTER — Other Ambulatory Visit: Payer: Self-pay | Admitting: Internal Medicine

## 2013-09-09 NOTE — Telephone Encounter (Signed)
Refill for pravastatin denied. Patient needs appt. Last OV: 11/13

## 2013-09-15 ENCOUNTER — Other Ambulatory Visit: Payer: Self-pay | Admitting: Internal Medicine

## 2013-09-15 NOTE — Telephone Encounter (Signed)
Med filled.  

## 2013-10-12 ENCOUNTER — Other Ambulatory Visit: Payer: Self-pay | Admitting: Internal Medicine

## 2013-10-18 ENCOUNTER — Telehealth: Payer: Self-pay | Admitting: *Deleted

## 2013-10-18 MED ORDER — ROPINIROLE HCL 5 MG PO TABS: ORAL_TABLET | ORAL | Status: DC

## 2013-10-18 NOTE — Telephone Encounter (Signed)
Rx sent to the pharmacy by e-script.  Pt was informed she will need complete physical.  Pt agreed and a CPE was scheduled.//AB/CMA

## 2013-10-24 ENCOUNTER — Ambulatory Visit (INDEPENDENT_AMBULATORY_CARE_PROVIDER_SITE_OTHER): Payer: Medicare Other | Admitting: Internal Medicine

## 2013-10-24 ENCOUNTER — Encounter: Payer: Self-pay | Admitting: Internal Medicine

## 2013-10-24 VITALS — BP 138/70 | HR 84 | Temp 97.5°F | Ht 65.0 in | Wt 156.4 lb

## 2013-10-24 DIAGNOSIS — M949 Disorder of cartilage, unspecified: Secondary | ICD-10-CM

## 2013-10-24 DIAGNOSIS — R7309 Other abnormal glucose: Secondary | ICD-10-CM

## 2013-10-24 DIAGNOSIS — M899 Disorder of bone, unspecified: Secondary | ICD-10-CM

## 2013-10-24 DIAGNOSIS — E785 Hyperlipidemia, unspecified: Secondary | ICD-10-CM

## 2013-10-24 DIAGNOSIS — Z0000 Encounter for general adult medical examination without abnormal findings: Secondary | ICD-10-CM

## 2013-10-24 DIAGNOSIS — Z803 Family history of malignant neoplasm of breast: Secondary | ICD-10-CM

## 2013-10-24 DIAGNOSIS — G2581 Restless legs syndrome: Secondary | ICD-10-CM

## 2013-10-24 MED ORDER — PRAVASTATIN SODIUM 20 MG PO TABS: ORAL_TABLET | ORAL | Status: DC

## 2013-10-24 MED ORDER — ROPINIROLE HCL 5 MG PO TABS: ORAL_TABLET | ORAL | Status: DC

## 2013-10-24 NOTE — Progress Notes (Signed)
Pre visit review using our clinic review tool, if applicable. No additional management support is needed unless otherwise documented below in the visit note. 

## 2013-10-24 NOTE — Patient Instructions (Addendum)
Your next office appointment will be determined based upon review of your pending labs & x-rays. Those instructions will be transmitted to you through My Chart . As per the Standard of Care , screening Colonoscopy recommended @ 50 & every 5-10 years thereafter . More frequent monitor would be dictated by family history or findings @ Colonoscopy 

## 2013-10-24 NOTE — Progress Notes (Signed)
Subjective:    Patient ID: Andrea Kelley, female    DOB: 11/16/1940, 73 y.o.   MRN: [id number]  HPI Medicare Wellness Visit: Psychosocial and medical history were reviewed as required by Medicare (history related to abuse, antisocial behavior , firearm risk). Social history: Caffeine: 1-2 cups /day , Alcohol: 7-14/week , Tobacco RXV:QMGQQ Exercise:CURVES & walking 6 X/week Personal safety/fall risk:no Limitations of activities of daily living:no Seatbelt/ smoke alarm use:yes Healthcare Power of Attorney/Living Will status: UTD Ophthalmologic exam status:UTD Hearing evaluation status:not current Orientation: Oriented X3 Memory and recall: good Math testing: good Depression/anxiety assessment: no Foreign travel history:[address] [place] ([place] Immunization status for influenza/pneumonia/ shingles /tetanus:UTD Transfusion history:? As child Preventive health care maintenance status: Colonoscopy/BMD/mammogram/Pap as per protocol/standard care:last colonoscopy 2001; mammogram due; S/P TAH & BSO Dental care:every 6 mos Chart reviewed and updated. Active issues reviewed and addressed as documented below.    Review of Systems A heart healthy diet is followed; exercise as noted without symptoms.  Family history is negative for premature coronary disease. Advanced cholesterol testing reveals  LDL goal is less than 100 ; ideally < 70 . There is medication compliance with the statin.  Low dose ASA taken Specifically denied are  chest pain, palpitations, dyspnea, or claudication.  Significant abdominal symptoms, memory deficit, or myalgias not present.     Objective:   Physical Exam Gen.: Healthy and well-nourished in appearance. Alert, appropriate and cooperative throughout exam. Appears younger than stated age  Head: Normocephalic without obvious abnormalities Eyes: No corneal or conjunctival inflammation noted. Pupils equal round reactive to light and accommodation. Extraocular motion intact.  Fundal exam is benign without hemorrhages, exudate, papilledema.  Vision grossly normal with /w/o lenses Ears: External  ear exam reveals no significant lesions or deformities. Canals clear .TMs normal. Hearing is grossly normal bilaterally. Nose: External nasal exam reveals no deformity or inflammation. Nasal mucosa are pink and moist. No lesions or exudates noted.   Mouth: Oral mucosa and oropharynx reveal no lesions or exudates. Teeth in good repair. Neck: No deformities, masses, or tenderness noted. Range of motion decreased. Thyroid normal. Lungs: Normal respiratory effort; chest expands symmetrically. Lungs are clear to auscultation without rales, wheezes, or increased work of breathing. Heart: Normal rate and rhythm. Normal S1 and S2. No gallop, click, or rub.S4  w/o murmur. Abdomen: Bowel sounds normal; abdomen soft and nontender. No masses, organomegaly or hernias noted. Genitalia:  S/P TAH & BSO                                  Musculoskeletal/extremities: No deformity or scoliosis noted of  the thoracic or lumbar spine.  No clubbing, cyanosis, edema, or significant extremity  deformity noted. Range of motion normal .Tone & strength normal. Hand joints normal  Fingernail health good. Able to lie down & sit up w/o help. Negative SLR bilaterally Vascular: Carotid, radial artery, dorsalis pedis and  posterior tibial pulses are full and equal. No bruits present. Neurologic: Alert and oriented x3. Deep tendon reflexes symmetrical and normal.  Gait normal        Skin: Intact without suspicious lesions or rashes. Lymph: No cervical, axillary lymphadenopathy present. Psych: Mood and affect are normal. Normally interactive  Assessment & Plan:  #1 Medicare Wellness Exam; criteria met ; data entered #2 Problem List/Diagnoses reviewed Plan:  Assessments made/ Orders entered  

## 2013-10-26 ENCOUNTER — Other Ambulatory Visit (INDEPENDENT_AMBULATORY_CARE_PROVIDER_SITE_OTHER): Payer: Medicare Other

## 2013-10-26 DIAGNOSIS — G2581 Restless legs syndrome: Secondary | ICD-10-CM

## 2013-10-26 DIAGNOSIS — E785 Hyperlipidemia, unspecified: Secondary | ICD-10-CM

## 2013-10-26 DIAGNOSIS — R7309 Other abnormal glucose: Secondary | ICD-10-CM

## 2013-10-26 DIAGNOSIS — M949 Disorder of cartilage, unspecified: Secondary | ICD-10-CM

## 2013-10-26 DIAGNOSIS — M899 Disorder of bone, unspecified: Secondary | ICD-10-CM

## 2013-10-26 LAB — LIPID PANEL
Cholesterol: 154 mg/dL (ref 0–200)
HDL: 43.1 mg/dL (ref >39.00)
LDL CALC: 74 mg/dL (ref 0–99)
Total CHOL/HDL Ratio: 4
Triglycerides: 183 mg/dL — ABNORMAL HIGH (ref 0.0–149.0)
VLDL: 36.6 mg/dL (ref 0.0–40.0)

## 2013-10-26 LAB — BASIC METABOLIC PANEL
BUN: 19 mg/dL (ref 6–23)
CHLORIDE: 107 meq/L (ref 96–112)
CO2: 27 mEq/L (ref 19–32)
Calcium: 9.3 mg/dL (ref 8.4–10.5)
Creatinine, Ser: 0.8 mg/dL (ref 0.4–1.2)
GFR: 70.51 mL/min (ref >60.00)
Glucose, Bld: 106 mg/dL — ABNORMAL HIGH (ref 70–99)
POTASSIUM: 4.4 meq/L (ref 3.5–5.1)
SODIUM: 140 meq/L (ref 135–145)

## 2013-10-26 LAB — HEPATIC FUNCTION PANEL
ALBUMIN: 4.2 g/dL (ref 3.5–5.2)
ALK PHOS: 68 U/L (ref 39–117)
ALT: 30 U/L (ref 0–35)
AST: 24 U/L (ref 0–37)
BILIRUBIN DIRECT: 0 mg/dL (ref 0.0–0.3)
BILIRUBIN TOTAL: 0.5 mg/dL (ref 0.3–1.2)
Total Protein: 7.1 g/dL (ref 6.0–8.3)

## 2013-10-26 LAB — CBC WITH DIFFERENTIAL/PLATELET
BASOS PCT: 0.6 % (ref 0.0–3.0)
Basophils Absolute: 0 10*3/uL (ref 0.0–0.1)
EOS ABS: 0.2 10*3/uL (ref 0.0–0.7)
EOS PCT: 3.1 % (ref 0.0–5.0)
HEMATOCRIT: 40.5 % (ref 36.0–46.0)
Hemoglobin: 13.7 g/dL (ref 12.0–15.0)
LYMPHS ABS: 3.1 10*3/uL (ref 0.7–4.0)
Lymphocytes Relative: 42.6 % (ref 12.0–46.0)
MCHC: 33.8 g/dL (ref 30.0–36.0)
MCV: 92.2 fl (ref 78.0–100.0)
MONO ABS: 0.5 10*3/uL (ref 0.1–1.0)
Monocytes Relative: 6.9 % (ref 3.0–12.0)
Neutro Abs: 3.4 10*3/uL (ref 1.4–7.7)
Neutrophils Relative %: 46.8 % (ref 43.0–77.0)
PLATELETS: 344 10*3/uL (ref 150.0–400.0)
RBC: 4.39 Mil/uL (ref 3.87–5.11)
RDW: 12.8 % (ref 11.5–14.6)
WBC: 7.4 10*3/uL (ref 4.5–10.5)

## 2013-10-26 LAB — TSH: TSH: 2.12 u[IU]/mL (ref 0.35–5.50)

## 2013-10-26 LAB — HEMOGLOBIN A1C: HEMOGLOBIN A1C: 6.1 % (ref 4.6–6.5)

## 2013-10-30 LAB — VITAMIN D 1,25 DIHYDROXY
Vitamin D 1, 25 (OH)2 Total: 37 pg/mL (ref 18–72)
Vitamin D2 1, 25 (OH)2: <8 pg/mL
Vitamin D3 1, 25 (OH)2: 37 pg/mL

## 2014-01-07 ENCOUNTER — Ambulatory Visit (INDEPENDENT_AMBULATORY_CARE_PROVIDER_SITE_OTHER): Payer: Medicare Other | Admitting: Family Medicine

## 2014-01-07 VITALS — BP 120/70 | HR 77 | Temp 97.9°F | Resp 20 | Ht 65.25 in | Wt 150.4 lb

## 2014-01-07 DIAGNOSIS — W57XXXA Bitten or stung by nonvenomous insect and other nonvenomous arthropods, initial encounter: Secondary | ICD-10-CM

## 2014-01-07 DIAGNOSIS — S90569A Insect bite (nonvenomous), unspecified ankle, initial encounter: Secondary | ICD-10-CM

## 2014-01-07 DIAGNOSIS — S70262A Insect bite (nonvenomous), left hip, initial encounter: Secondary | ICD-10-CM

## 2014-01-07 NOTE — Progress Notes (Addendum)
Subjective:    Patient ID: Andrea Kelley, female    DOB: 1941-05-19, 73 y.o.   MRN: [id number]  HPI This chart was scribed for Norberto Sorenson, MD by Charline Bills, ED Scribe. The patient was seen in room 3. Patient's care was started at 12:38 PM. Chief Complaint  Patient presents with  . tick bite    tick bite to her left hip area.  pt stated that she removed the tick but the area is red  and wants this checked.     HPI Comments: Andrea Kelley is a 73 y.o. female who presents to the [facility] complaining of tick bite to L upper thigh. Pt states that the tick had been there for 2 days before it was removed this morning. She initially thought it was a scab. Pt reports that the head and body was removed. She suspects that the tick came from new [place] around their homes. She denies fever, chills, arthralgias, rash. She flushed the tick down the toilet but states it was quite small - just 2-3 mm likely. Is leaving on vacation in 1 wk.  Past Medical History  Diagnosis Date  . Osteopenia      [place]  . DDD (degenerative disc disease), lumbar   . Hyperlipidemia    Current Outpatient Prescriptions on File Prior to Visit  Medication Sig Dispense Refill  . aspirin 81 MG tablet Take 81 mg by mouth daily.        . pravastatin (PRAVACHOL) 20 MG tablet TAKE 1 TABLET (20 MG TOTAL) BY MOUTH DAILY.  90 tablet  3  . ropinirole (REQUIP) 5 MG tablet TAKE 1 TABLET BY MOUTH AT BEDTIME.  90 tablet  3   No current facility-administered medications on file prior to visit.   Allergies  Allergen Reactions  . Sulfonamide Derivatives     REACTION: rash as child    Review of Systems  Constitutional: Negative for fever and chills.  Gastrointestinal: Negative for nausea and vomiting.  Musculoskeletal: Negative for arthralgias, myalgias, neck pain and neck stiffness.  Skin: Positive for wound (bite). Negative for rash.  Neurological: Negative for dizziness, light-headedness  and headaches.  Psychiatric/Behavioral: Negative for sleep disturbance.     BP 120/70  Pulse 77  Temp(Src) 97.9 F (36.6 C) (Oral)  Resp 20  Ht 5' 5.25" (1.657 m)  Wt 150 lb 6.4 oz (68.221 kg)  BMI 24.85 kg/m2  SpO2 98% Objective:   Physical Exam  Nursing note and vitals reviewed. Constitutional: She is oriented to person, place, and time. She appears well-developed and well-nourished. No distress.  HENT:  Head: Normocephalic and atraumatic.  Eyes: EOM are normal.  Neck: Neck supple.  Pulmonary/Chest: Effort normal. No respiratory distress.  Musculoskeletal: Normal range of motion.  Neurological: She is alert and oriented to person, place, and time.  Skin: Skin is warm and dry. Lesion noted. No rash noted.  0.25 cm induration to L upper thigh at site of tick bit with 2-3 mm erythematous macule with blanching erythema surrounding.  Psychiatric: She has a normal mood and affect. Her behavior is normal.      Assessment & Plan:  Tick bite of left hip Watchful waiting w/ warm compresses. RTC immed for any increased pain, redness, swelling, or purulent drainage or if erythema is beginning to spread will call in course of doxycyclin 100 bid x 7d. If develops target rash around bite or spotted rash on distal extremities will RTC immed for  labs and trx. RTC immed for any flu-like sxs.  Pt agrees w/ plan.   I personally performed the services described in this documentation, which was scribed in my presence. The recorded information has been reviewed and considered, and addended by me as needed.  Norberto SorensonEva Gustie Bobb, MD MPH

## 2014-01-07 NOTE — Patient Instructions (Signed)
Tick Bite Information Ticks are insects that attach themselves to the skin and draw blood for food. There are various types of ticks. Common types include wood ticks and deer ticks. Most ticks live in shrubs and grassy areas. Ticks can climb onto your body when you make contact with leaves or grass where the tick is waiting. The most common places on the body for ticks to attach themselves are the scalp, neck, armpits, waist, and groin. Most tick bites are harmless, but sometimes ticks carry germs that cause diseases. These germs can be spread to a person during the tick's feeding process. The chance of a disease spreading through a tick bite depends on:   The type of tick.  Time of year.   How long the tick is attached.   Geographic location.  HOW CAN YOU PREVENT TICK BITES? Take these steps to help prevent tick bites when you are outdoors:  Wear protective clothing. Long sleeves and long pants are best.   Wear white clothes so you can see ticks more easily.  Tuck your pant legs into your socks.   If walking on a trail, stay in the middle of the trail to avoid brushing against bushes.  Avoid walking through areas with long grass.  Put insect repellent on all exposed skin and along boot tops, pant legs, and sleeve cuffs.   Check clothing, hair, and skin repeatedly and before going inside.   Brush off any ticks that are not attached.  Take a shower or bath as soon as possible after being outdoors.  WHAT IS THE PROPER WAY TO REMOVE A TICK? Ticks should be removed as soon as possible to help prevent diseases caused by tick bites. 1. If latex gloves are available, put them on before trying to remove a tick.  2. Using fine-point tweezers, grasp the tick as close to the skin as possible. You may also use curved forceps or a tick removal tool. Grasp the tick as close to its head as possible. Avoid grasping the tick on its body. 3. Pull gently with steady upward pressure until  the tick lets go. Do not twist the tick or jerk it suddenly. This may break off the tick's head or mouth parts. 4. Do not squeeze or crush the tick's body. This could force disease-carrying fluids from the tick into your body.  5. After the tick is removed, wash the bite area and your hands with soap and water or other disinfectant such as alcohol. 6. Apply a small amount of antiseptic cream or ointment to the bite site.  7. Wash and disinfect any instruments that were used.  Do not try to remove a tick by applying a hot match, petroleum jelly, or fingernail polish to the tick. These methods do not work and may increase the chances of disease being spread from the tick bite.  WHEN SHOULD YOU SEEK MEDICAL CARE? Contact your health care provider if you are unable to remove a tick from your skin or if a part of the tick breaks off and is stuck in the skin.  After a tick bite, you need to be aware of signs and symptoms that could be related to diseases spread by ticks. Contact your health care provider if you develop any of the following in the days or weeks after the tick bite:  Unexplained fever.  Rash. A circular rash that appears days or weeks after the tick bite may indicate the possibility of Lyme disease. The rash may resemble   a target with a bull's-eye and may occur at a different part of your body than the tick bite.  Redness and swelling in the area of the tick bite.   Tender, swollen lymph glands.   Diarrhea.   Weight loss.   Cough.   Fatigue.   Muscle, joint, or bone pain.   Abdominal pain.   Headache.   Lethargy or a change in your level of consciousness.  Difficulty walking or moving your legs.   Numbness in the legs.   Paralysis.  Shortness of breath.   Confusion.   Repeated vomiting.  Document Released: 07/25/2000 Document Revised: 05/18/2013 Document Reviewed: 01/05/2013 [employer] Patient Information 2014 [employer].  

## 2014-08-14 ENCOUNTER — Ambulatory Visit (INDEPENDENT_AMBULATORY_CARE_PROVIDER_SITE_OTHER): Payer: Medicare Other

## 2014-08-14 ENCOUNTER — Ambulatory Visit (INDEPENDENT_AMBULATORY_CARE_PROVIDER_SITE_OTHER): Payer: Medicare Other | Admitting: Family Medicine

## 2014-08-14 VITALS — BP 140/92 | HR 104 | Temp 97.6°F | Resp 18 | Ht 65.0 in | Wt 145.0 lb

## 2014-08-14 DIAGNOSIS — S93402A Sprain of unspecified ligament of left ankle, initial encounter: Secondary | ICD-10-CM

## 2014-08-14 DIAGNOSIS — M25472 Effusion, left ankle: Secondary | ICD-10-CM

## 2014-08-14 DIAGNOSIS — M79672 Pain in left foot: Secondary | ICD-10-CM

## 2014-08-14 DIAGNOSIS — M8580 Other specified disorders of bone density and structure, unspecified site: Secondary | ICD-10-CM

## 2014-08-14 NOTE — Patient Instructions (Signed)

## 2014-08-14 NOTE — Progress Notes (Signed)
Patient fit and trained for left Sweedo Ankle Brace.

## 2014-08-14 NOTE — Progress Notes (Signed)
Chief Complaint:  Chief Complaint  Patient presents with  . Foot Injury    injury happen last night; fell and hurt left foot    HPI: Andrea Kelley is a 74 y.o. female who is here for left foot pain, tripped over husband and now has left foot paina nd ankle swelling. She has pain with weightbearing and decreased ROM in dorsi flexion, she has ahd no prior injuries to that foot bt has a dx of osteopenia with sponta foot fracture in right in the past. Was on foamax but was not helpful so did not continue. LAst Dexa scan was many years ago.  No n/w/t  Past Medical History  Diagnosis Date  . Osteopenia     [place]  . DDD (degenerative disc disease), lumbar   . Hyperlipidemia    Past Surgical History  Procedure Laterality Date  . Abdominal hysterectomy      For dysfunctional bleeding; no BSO  . Back surgery  1970    ruptured disc ; Dr Roxan Hockey  . Colonoscopy  2001    Negative; done for rectal bleeding   History   Social History  . Marital Status: Married    Spouse Name: N/A    Number of Children: N/A  . Years of Education: N/A   Social History Main Topics  . Smoking status: Never Smoker   . Smokeless tobacco: None  . Alcohol Use: Yes     Comment: Socially , 1-2 glasseswine / night  . Drug Use: No  . Sexual Activity: None   Other Topics Concern  . None   Social History Narrative   Family History  Problem Relation Age of Onset  . Lung cancer Father     smoker  . Heart attack Mother 60  . Stroke Mother     in 71s  . Breast cancer Maternal Aunt   . Diabetes Neg Hx    Allergies  Allergen Reactions  . Sulfonamide Derivatives     REACTION: rash as child   Prior to Admission medications   Medication Sig Start Date End Date Taking? Authorizing Provider  aspirin 81 MG tablet Take 81 mg by mouth daily.     Yes Historical Provider, MD  pravastatin (PRAVACHOL) 20 MG tablet TAKE 1 TABLET (20 MG TOTAL) BY MOUTH DAILY. 10/24/13  Yes [name], MD    ropinirole (REQUIP) 5 MG tablet TAKE 1 TABLET BY MOUTH AT BEDTIME. 10/24/13  Yes [name], MD     ROS: The patient denies fevers, chills, night sweats, unintentional weight loss, chest pain, palpitations, wheezing, dyspnea on exertion, nausea, vomiting, abdominal pain, dysuria, hematuria, melena, numbness, weakness, or tingling.  All other systems have been reviewed and were otherwise negative with the exception of those mentioned in the HPI and as above.    PHYSICAL EXAM: Filed Vitals:   08/14/14 1138  BP: 140/92  Pulse: 104  Temp: 97.6 F (36.4 C)  Resp: 18   Filed Vitals:   08/14/14 1138  Height: 5\' 5"  (1.651 m)  Weight: 145 lb (65.772 kg)   Body mass index is 24.13 kg/(m^2).  General: Alert, no acute distress HEENT:  Normocephalic, atraumatic, oropharynx patent. EOMI, PERRLA Cardiovascular:  Regular rate and rhythm, no rubs murmurs or gallops.  No Carotid bruits, radial pulse intact. No pedal edema.  Respiratory: Clear to auscultation bilaterally.  No wheezes, rales, or rhonchi.  No cyanosis, no use of accessory musculature GI: No organomegaly, abdomen is soft  and non-tender, positive bowel sounds.  No masses. Skin: No rashes. Neurologic: Facial musculature symmetric. Psychiatric: Patient is appropriate throughout our interaction. Lymphatic: No cervical lymphadenopathy Musculoskeletal: Gait intact. + left lateral ankle swelling, tenderness 5/5 strength Good DP + ecchymosis, sensation intact  LABS: Results for orders placed or performed in visit on 10/26/13  Basic metabolic panel  Result Value Ref Range   Sodium 140 135 - 145 mEq/L   Potassium 4.4 3.5 - 5.1 mEq/L   Chloride 107 96 - 112 mEq/L   CO2 27 19 - 32 mEq/L   Glucose, Bld 106 (H) 70 - 99 mg/dL   BUN 19 6 - 23 mg/dL   Creatinine, Ser 0.8 0.4 - 1.2 mg/dL   Calcium 9.3 8.4 - 16.1 mg/dL   GFR 09.60 >45.40 mL/min  Hemoglobin A1c  Result Value Ref Range   Hgb A1c MFr Bld 6.1 4.6 - 6.5 %  Hepatic  function panel  Result Value Ref Range   Total Bilirubin 0.5 0.3 - 1.2 mg/dL   Bilirubin, Direct 0.0 0.0 - 0.3 mg/dL   Alkaline Phosphatase 68 39 - 117 U/L   AST 24 0 - 37 U/L   ALT 30 0 - 35 U/L   Total Protein 7.1 6.0 - 8.3 g/dL   Albumin 4.2 3.5 - 5.2 g/dL  Lipid panel  Result Value Ref Range   Cholesterol 154 0 - 200 mg/dL   Triglycerides 981.1 (H) 0.0 - 149.0 mg/dL   HDL 91.47 >82.95 mg/dL   VLDL 62.1 0.0 - 30.8 mg/dL   LDL Cholesterol 74 0 - 99 mg/dL   Total CHOL/HDL Ratio 4   TSH  Result Value Ref Range   TSH 2.12 0.35 - 5.50 uIU/mL  Vitamin D 1,25 dihydroxy  Result Value Ref Range   Vitamin D 1, 25 (OH)2 Total 37 18 - 72 pg/mL   Vitamin D3 1, 25 (OH)2 37 pg/mL   Vitamin D2 1, 25 (OH)2 <8 pg/mL  CBC w/Diff  Result Value Ref Range   WBC 7.4 4.5 - 10.5 K/uL   RBC 4.39 3.87 - 5.11 Mil/uL   Hemoglobin 13.7 12.0 - 15.0 g/dL   HCT 65.7 84.6 - 96.2 %   MCV 92.2 78.0 - 100.0 fl   MCHC 33.8 30.0 - 36.0 g/dL   RDW 95.2 84.1 - 32.4 %   Platelets 344.0 150.0 - 400.0 K/uL   Neutrophils Relative % 46.8 43.0 - 77.0 %   Lymphocytes Relative 42.6 12.0 - 46.0 %   Monocytes Relative 6.9 3.0 - 12.0 %   Eosinophils Relative 3.1 0.0 - 5.0 %   Basophils Relative 0.6 0.0 - 3.0 %   Neutro Abs 3.4 1.4 - 7.7 K/uL   Lymphs Abs 3.1 0.7 - 4.0 K/uL   Monocytes Absolute 0.5 0.1 - 1.0 K/uL   Eosinophils Absolute 0.2 0.0 - 0.7 K/uL   Basophils Absolute 0.0 0.0 - 0.1 K/uL     EKG/XRAY:   Primary read interpreted by Dr[facility] Con[facility] at [facility]. Neg for acute  fx or dislocation She has chronic change to cuboid, does not appear new    ASSESSMENT/PLAN: Encounter Diagnoses  Name Primary?  . Left foot pain   . Left ankle swelling   . Sprain of ankle, left, initial encounter Yes  . Osteopenia    RICE and also nonweightbearing x 1 week, ROM with ankle She will be given a sweedo, will get cructhces on her own, decline crutches here in office After discussed with patient, she  doe snot want a cam boot.    F/u in 1 week if worse   Gross sideeffects, risk and benefits, and alternatives of medications d/w patient. Patient is aware that all medications have potential sideeffects and we are unable to predict every sideeffect or drug-drug interaction that may occur.  Gaither Biehn PHUONG, DO 08/14/2014 2:50 PM

## 2014-10-19 ENCOUNTER — Other Ambulatory Visit: Payer: Self-pay

## 2014-10-19 DIAGNOSIS — G2581 Restless legs syndrome: Secondary | ICD-10-CM

## 2014-10-19 MED ORDER — ROPINIROLE HCL 5 MG PO TABS: ORAL_TABLET | ORAL | Status: DC

## 2014-10-27 ENCOUNTER — Telehealth: Payer: Self-pay

## 2014-10-27 NOTE — Telephone Encounter (Signed)
Fu for flu shot, [facility] [address] [facility], on [facility] for longer 15 min, could not confirm

## 2015-01-02 ENCOUNTER — Encounter: Payer: Self-pay | Admitting: Internal Medicine

## 2015-01-02 ENCOUNTER — Ambulatory Visit (INDEPENDENT_AMBULATORY_CARE_PROVIDER_SITE_OTHER)
Admission: RE | Admit: 2015-01-02 | Discharge: 2015-01-02 | Disposition: A | Discharge status: Home / Self Care | Payer: Medicare Other | Source: Ambulatory Visit | Attending: Internal Medicine | Admitting: Internal Medicine

## 2015-01-02 ENCOUNTER — Other Ambulatory Visit: Payer: Self-pay | Admitting: Internal Medicine

## 2015-01-02 ENCOUNTER — Ambulatory Visit (INDEPENDENT_AMBULATORY_CARE_PROVIDER_SITE_OTHER): Payer: Medicare Other | Admitting: Internal Medicine

## 2015-01-02 ENCOUNTER — Other Ambulatory Visit (INDEPENDENT_AMBULATORY_CARE_PROVIDER_SITE_OTHER): Payer: Medicare Other

## 2015-01-02 VITALS — BP 132/78 | HR 68 | Temp 97.6°F | Wt 152.0 lb

## 2015-01-02 DIAGNOSIS — E785 Hyperlipidemia, unspecified: Secondary | ICD-10-CM

## 2015-01-02 DIAGNOSIS — M8580 Other specified disorders of bone density and structure, unspecified site: Secondary | ICD-10-CM | POA: Diagnosis not present

## 2015-01-02 DIAGNOSIS — R739 Hyperglycemia, unspecified: Secondary | ICD-10-CM | POA: Diagnosis not present

## 2015-01-02 DIAGNOSIS — Z23 Encounter for immunization: Secondary | ICD-10-CM

## 2015-01-02 LAB — HEPATIC FUNCTION PANEL
ALBUMIN: 4.1 g/dL (ref 3.5–5.2)
ALT: 19 U/L (ref 0–35)
AST: 15 U/L (ref 0–37)
Alkaline Phosphatase: 78 U/L (ref 39–117)
BILIRUBIN DIRECT: 0.1 mg/dL (ref 0.0–0.3)
BILIRUBIN TOTAL: 0.5 mg/dL (ref 0.2–1.2)
TOTAL PROTEIN: 6.7 g/dL (ref 6.0–8.3)

## 2015-01-02 LAB — VITAMIN D 25 HYDROXY (VIT D DEFICIENCY, FRACTURES): VITD: 14.13 ng/mL — ABNORMAL LOW (ref 30.00–100.00)

## 2015-01-02 LAB — BASIC METABOLIC PANEL
BUN: 14 mg/dL (ref 6–23)
CO2: 27 mEq/L (ref 19–32)
Calcium: 9.1 mg/dL (ref 8.4–10.5)
Chloride: 106 mEq/L (ref 96–112)
Creatinine, Ser: 0.72 mg/dL (ref 0.40–1.20)
GFR: 83.96 mL/min (ref >60.00)
Glucose, Bld: 103 mg/dL — ABNORMAL HIGH (ref 70–99)
Potassium: 4.4 mEq/L (ref 3.5–5.1)
Sodium: 139 mEq/L (ref 135–145)

## 2015-01-02 LAB — HEMOGLOBIN A1C: Hgb A1c MFr Bld: 5.7 % (ref 4.6–6.5)

## 2015-01-02 LAB — TSH: TSH: 1.41 u[IU]/mL (ref 0.35–4.50)

## 2015-01-02 NOTE — Patient Instructions (Signed)
  Your next office appointment will be determined based upon review of your pending labs  and  BMD  Those written interpretation of the lab results and instructions will be transmitted to you by My Chart    Critical results will be called.   Followup as needed for any active or acute issue. Please report any significant change in your symptoms. 

## 2015-01-02 NOTE — Progress Notes (Signed)
   Subjective:    Patient ID: Andrea Kelley, female    DOB: 1941-05-03, 74 y.o.   MRN: [id number]  HPI The patient is here to assess status of active health conditions.  PMH, FH, & Social History reviewed & updated.  She is on a modified heart healthy diet; she  Does eat some red meat. She exercises 3 times a week for 45 minutes at [employer] without associated cardiopulmonary symptoms. She has been compliant with her statin without adverse effect.  The family history is positive for stroke in her mother in her 3680s and heart attack in her 1470s.  She has had hyperglycemia. She has no related symptoms. There is no family history diabetes  She does have restless leg syndrome; Requip is of benefit usually. She also has some plantar fasciitis symptoms.  Her last colonoscopy was 2001; she has no active GI symptoms.  Her last bone density was in February 2014.Marland Kitchen.      Review of Systems   Chest pain, palpitations, tachycardia, exertional dyspnea, paroxysmal nocturnal dyspnea, claudication or edema are absent.  She denies any memory issues or myalgias.  Unexplained weight loss, abdominal pain, significant dyspepsia, dysphagia, melena, rectal bleeding, or persistently small caliber stools are denied.   Polyuria, polyphagia, polydipsia absent.  There is no blurred vision, double vision, or loss of vision.   No postural dizziness noted. Denied are numbness, tingling, or burning of the extremities.  No nonhealing skin lesions present.  Weight is stable.      Objective:   Physical Exam  General appearance :adequately nourished; in no distress.  Eyes: No conjunctival inflammation or scleral icterus is present.  Oral exam:  Lips and gums are healthy appearing.There is no oropharyngeal erythema or exudate noted. Dental hygiene is good.  Heart:  Normal rate and regular rhythm. S1 and S2 normal without gallop, murmur, click, rub or other extra sounds    Lungs:Chest clear to  auscultation; no wheezes, rhonchi,rales ,or rubs present.No increased work of breathing.   Abdomen: bowel sounds normal, soft and non-tender without masses, organomegaly or hernias noted.  No guarding or rebound.   Vascular : all pulses equal ; no bruits present.  Skin:Warm & dry.  Intact without suspicious lesions or rashes ; no tenting or jaundice.Ecchymosis R thumb.  WU:[id number]:Minor crepitus of the knees is present.  Lymphatic: No lymphadenopathy is noted about the head, neck, axilla   Neuro: Strength, tone & DTRs normal.        Assessment & Plan:  See Current Assessment & Plan in Problem List under specific Diagnosis

## 2015-01-02 NOTE — Assessment & Plan Note (Signed)
A1c

## 2015-01-02 NOTE — Assessment & Plan Note (Signed)
NMR Lipoprofile, LFT, TSH 

## 2015-01-02 NOTE — Progress Notes (Signed)
Pre visit review using our clinic review tool, if applicable. No additional management support is needed unless otherwise documented below in the visit note. 

## 2015-01-02 NOTE — Assessment & Plan Note (Signed)
BMD 

## 2015-01-04 LAB — NMR LIPOPROFILE WITH LIPIDS
Cholesterol, Total: 166 mg/dL (ref 100–199)
HDL PARTICLE NUMBER: 37.7 umol/L (ref >=30.5)
HDL Size: 9.9 nm (ref >=9.2)
HDL-C: 75 mg/dL (ref >39)
LARGE VLDL-P: 1.2 nmol/L (ref <=2.7)
LDL (calc): 82 mg/dL (ref 0–99)
LDL Particle Number: 822 nmol/L (ref <1000)
LDL Size: 21.5 nm (ref >=20.8)
Large HDL-P: 13 umol/L (ref >=4.8)
SMALL LDL PARTICLE NUMBER: 235 nmol/L (ref <=527)
TRIGLYCERIDES: 47 mg/dL (ref 0–149)
VLDL SIZE: 41 nm (ref <=46.6)

## 2015-01-18 ENCOUNTER — Encounter: Payer: Self-pay | Admitting: Internal Medicine

## 2015-01-18 DIAGNOSIS — G2581 Restless legs syndrome: Secondary | ICD-10-CM

## 2015-01-18 MED ORDER — ROPINIROLE HCL 5 MG PO TABS: ORAL_TABLET | ORAL | Status: DC

## 2015-02-05 ENCOUNTER — Other Ambulatory Visit: Payer: Self-pay

## 2015-03-25 ENCOUNTER — Other Ambulatory Visit: Payer: Self-pay | Admitting: Internal Medicine

## 2015-03-26 ENCOUNTER — Other Ambulatory Visit: Payer: Self-pay | Admitting: Emergency Medicine

## 2015-03-26 DIAGNOSIS — E785 Hyperlipidemia, unspecified: Secondary | ICD-10-CM

## 2015-03-26 MED ORDER — PRAVASTATIN SODIUM 20 MG PO TABS: ORAL_TABLET | ORAL | Status: DC

## 2015-05-12 ENCOUNTER — Ambulatory Visit (INDEPENDENT_AMBULATORY_CARE_PROVIDER_SITE_OTHER): Payer: Medicare Other | Admitting: Family Medicine

## 2015-05-12 VITALS — BP 126/70 | HR 79 | Temp 97.9°F | Resp 16 | Ht 65.0 in | Wt 153.0 lb

## 2015-05-12 DIAGNOSIS — Z23 Encounter for immunization: Secondary | ICD-10-CM

## 2015-05-12 DIAGNOSIS — M722 Plantar fascial fibromatosis: Secondary | ICD-10-CM | POA: Diagnosis not present

## 2015-05-12 MED ORDER — PREDNISONE 20 MG PO TABS: 40.0000 mg | ORAL_TABLET | Freq: Every day | ORAL | Status: DC

## 2015-05-12 NOTE — Addendum Note (Signed)
Addended by: Vira Agar on: 05/12/2015 08:36 AM   Modules accepted: Orders

## 2015-05-12 NOTE — Progress Notes (Signed)
 @  This chart was scribed for Andrea Sidle, MD by [name], ED Scribe. This patient was seen in room 11 and the patient's care was started at 8:17 AM.  Patient ID: Andrea Kelley MRN: [id number], DOB: 1940-08-15, 74 y.o. Date of Encounter: 05/12/2015, 8:17 AM  Primary Physician: Andrea Melnick, MD  Chief Complaint:  Chief Complaint  Patient presents with   Foot Pain    right/ x 5 months   Immunizations    flu shot     HPI: 74 y.o. year old female with history below presents with right foot pain located to the heel for 6 months. She reports pain is the worse in the morning but becomes bearable throughout the day. She denies injury to right foot but does mention injuring her left foot 8 months ago, favoring her right foot more during that time. She has tried rolling a ball beneath her foot and stretches in the morning before getting out of bed with minimal relief.  She has a benign skin tag on the left side of her nose. She was seen by dermatology yesterday and received liquid nitrogen treatment.    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   Past Medical History  Diagnosis Date   Osteopenia     [place] [facility]   DDD (degenerative disc disease), lumbar    Hyperlipidemia      Home Meds: Prior to Admission medications   Medication Sig Start Date End Date Taking? Authorizing Provider  aspirin 81 MG tablet Take 81 mg by mouth daily.     Yes Historical Provider, MD  pravastatin (PRAVACHOL) 20 MG tablet TAKE 1 TABLET (20 MG TOTAL) BY MOUTH DAILY. 03/26/15  Yes [name], MD  ropinirole (REQUIP) 5 MG tablet TAKE 1 TABLET BY MOUTH AT BEDTIME. 01/18/15  Yes [name], MD    Allergies:  Allergies  Allergen Reactions   Sulfonamide Derivatives     REACTION: rash as child    Social History   Social History   Marital Status: Married    Spouse Name: N/A    Number of Children: N/A   Years of Education: N/A   Occupational History   Not on file.   Social History Main Topics   Smoking status: Never Smoker    Smokeless tobacco: Not on file   Alcohol Use: Yes     Comment: Socially , 1-2 glasseswine / night   Drug Use: No   Sexual Activity: Not on file   Other Topics Concern   Not on file   Social History Narrative     Review of Systems: Constitutional: negative for chills, fever, night sweats, weight changes, or fatigue  HEENT: negative for vision changes, hearing loss, congestion, rhinorrhea, ST, epistaxis, or sinus pressure Cardiovascular: negative for chest pain or palpitations Respiratory: negative for hemoptysis, wheezing, shortness of breath, or cough Abdominal: negative for abdominal pain, nausea, vomiting, diarrhea, or constipation Dermatological: negative for rash Neurologic: negative for headache, dizziness, or syncope All other systems reviewed and are otherwise negative with the exception to those above and in the HPI.   Physical Exam: Blood pressure 126/70, pulse 79, temperature 97.9 F (36.6 C), temperature source Oral, resp. rate 16, height  (1.651 m), weight 153 lb (69.4 kg), SpO2 98 %., Body mass index is 25.46 kg/(m^2). General: Well developed, well nourished, in no acute distress. Head: Normocephalic, atraumatic, eyes  without discharge, sclera non-icteric, nares are without discharge. Bilateral auditory canals clear, TM's are without perforation, pearly grey and translucent with reflective cone of light bilaterally. Oral cavity moist, posterior pharynx without exudate, erythema, peritonsillar abscess, or post nasal drip.  Neck: Supple. No thyromegaly. Full ROM. No lymphadenopathy. Msk:  Strength and tone normal for age. Extremities/Skin: Warm and dry. No clubbing or cyanosis. No edema. No rashes or suspicious lesions. Normal-appearing right foot although there is some tenderness on palpation of the  calcaneus both laterally and directly on the sole. Neuro: Alert and oriented X 3. Moves all extremities spontaneously. Gait is normal. CNII-XII grossly in tact. Psych:  Responds to questions appropriately with a normal affect.    ASSESSMENT AND PLAN:  74 y.o. year old female with plantar fasciitis This chart was scribed in my presence and reviewed by me personally.    ICD-9-CM ICD-10-CM   1. Plantar fasciitis of right foot 728.71 M72.2    We discussed some of the exercises for plantar fasciitis. She's done most of these. What were going to do now is try prednisone 40 mg daily for 5 days and 20 mg daily for 5 days.  By signing my name below, I, [name], attest that this documentation has been prepared under the direction and in the presence of Andrea Sidle, MD.  Electronically Signed: [name], ED Scribe. 05/12/2015. 8:25 AM.  Signed, Andrea Sidle, MD 05/12/2015 8:17 AM

## 2015-05-12 NOTE — Patient Instructions (Signed)
Plantar Fasciitis  Plantar fasciitis is a common condition that causes foot pain. It is soreness (inflammation) of the band of tough fibrous tissue on the bottom of the foot that runs from the heel bone (calcaneus) to the ball of the foot. The cause of this soreness may be from excessive standing, poor fitting shoes, running on hard surfaces, being overweight, having an abnormal walk, or overuse (this is common in runners) of the painful foot or feet. It is also common in aerobic exercise dancers and ballet dancers.  SYMPTOMS   Most people with plantar fasciitis complain of:   Severe pain in the morning on the bottom of their foot especially when taking the first steps out of bed. This pain recedes after a few minutes of walking.   Severe pain is experienced also during walking following a long period of inactivity.   Pain is worse when walking barefoot or up stairs  DIAGNOSIS    Your caregiver will diagnose this condition by examining and feeling your foot.   Special tests such as X-rays of your foot, are usually not needed.  PREVENTION    Consult a sports medicine professional before beginning a new exercise program.   Walking programs offer a good workout. With walking there is a lower chance of overuse injuries common to runners. There is less impact and less jarring of the joints.   Begin all new exercise programs slowly. If problems or pain develop, decrease the amount of time or distance until you are at a comfortable level.   Wear good shoes and replace them regularly.   Stretch your foot and the heel cords at the back of the ankle (Achilles tendon) both before and after exercise.   Run or exercise on even surfaces that are not hard. For example, asphalt is better than pavement.   Do not run barefoot on hard surfaces.   If using a treadmill, vary the incline.   Do not continue to workout if you have foot or joint problems. Seek professional help if they do not improve.  HOME CARE INSTRUCTIONS     Avoid activities that cause you pain until you recover.   Use ice or cold packs on the problem or painful areas after working out.   Only take over-the-counter or prescription medicines for pain, discomfort, or fever as directed by your caregiver.   Soft shoe inserts or athletic shoes with air or gel sole cushions may be helpful.   If problems continue or become more severe, consult a sports medicine caregiver or your own health care provider. Cortisone is a potent anti-inflammatory medication that may be injected into the painful area. You can discuss this treatment with your caregiver.  MAKE SURE YOU:    Understand these instructions.   Will watch your condition.   Will get help right away if you are not doing well or get worse.  Document Released: 04/22/2001 Document Revised: 10/20/2011 Document Reviewed: 06/21/2008  ExitCare Patient Information 2015 [employer]. This information is not intended to replace advice given to you by your health care provider. Make sure you discuss any questions you have with your health care provider.

## 2015-07-03 ENCOUNTER — Other Ambulatory Visit: Payer: Self-pay | Admitting: Internal Medicine

## 2015-07-10 ENCOUNTER — Other Ambulatory Visit: Payer: Self-pay | Admitting: Internal Medicine

## 2015-10-18 ENCOUNTER — Encounter: Payer: Self-pay | Admitting: Family

## 2015-10-18 ENCOUNTER — Ambulatory Visit (INDEPENDENT_AMBULATORY_CARE_PROVIDER_SITE_OTHER): Payer: Medicare Other | Admitting: Family

## 2015-10-18 VITALS — BP 120/70 | HR 70 | Temp 97.5°F | Ht 65.0 in | Wt 158.2 lb

## 2015-10-18 DIAGNOSIS — E785 Hyperlipidemia, unspecified: Secondary | ICD-10-CM | POA: Diagnosis not present

## 2015-10-18 DIAGNOSIS — G2581 Restless legs syndrome: Secondary | ICD-10-CM | POA: Diagnosis not present

## 2015-10-18 MED ORDER — PRAVASTATIN SODIUM 20 MG PO TABS: 20.0000 mg | ORAL_TABLET | Freq: Every day | ORAL | Status: DC

## 2015-10-18 MED ORDER — ROPINIROLE HCL 5 MG PO TABS: 5.0000 mg | ORAL_TABLET | Freq: Every day | ORAL | Status: DC

## 2015-10-18 NOTE — Progress Notes (Signed)
Pre visit review using our clinic review tool, if applicable. No additional management support is needed unless otherwise documented below in the visit note. 

## 2015-10-18 NOTE — Assessment & Plan Note (Signed)
Hyperlipidemia appears stable with current regimen and no adverse side effects or myalgias. Continue current dosage of pravastatin. Lipid profile at next office visit or during annual exam.

## 2015-10-18 NOTE — Progress Notes (Signed)
   Subjective:    Patient ID: Andrea Kelley, female    DOB: 08/18/1940, 75 y.o.   MRN: [id number]  Chief Complaint  Patient presents with  . Medication Refill    [facility]    HPI:  Andrea Kelley is a 75 y.o. female who  has a past medical history of Osteopenia; DDD (degenerative disc disease), lumbar; and Hyperlipidemia. and presents today for a follow up office visit.  1.) Hyperlipidemia - Currently prescribed pravastatin. Reports taking the medication as prescribed and denies adverse side effects or myalgias. No chest pain, shortness of breath, or heart palptiations.    Lab Results  Component Value Date   CHOL 166 01/02/2015   HDL 75 01/02/2015   LDLCALC 82 01/02/2015   LDLDIRECT 172.6 01/16/2011   TRIG 47 01/02/2015   CHOLHDL 4 10/26/2013     2.) Restless Leg Syndrome - Currently maintained on ropinirole. Reports taking the medication as prescribed and denies adverse side effects. Notes that her symptoms remain labile and sometime it is difficulty to sit still during the day.     Allergies  Allergen Reactions  . Sulfonamide Derivatives     REACTION: rash as child     Current Outpatient Prescriptions on File Prior to Visit  Medication Sig Dispense Refill  . aspirin 81 MG tablet Take 81 mg by mouth daily.       No current facility-administered medications on file prior to visit.     Review of Systems  Constitutional: Negative for fever and chills.  Respiratory: Negative for chest tightness and shortness of breath.   Cardiovascular: Negative for chest pain, palpitations and leg swelling.  Neurological: Negative for weakness and numbness.      Objective:    BP 120/70 mmHg  Pulse 70  Temp(Src) 97.5 F (36.4 C) (Oral)  Ht 5\' 5"  (1.651 m)  Wt 158 lb 4 oz (71.782 kg)  BMI 26.33 kg/m2  SpO2 98% Nursing note and vital signs reviewed.  Physical Exam  Constitutional: She is oriented to person, place, and time. She appears  well-developed and well-nourished. No distress.  Cardiovascular: Normal rate, regular rhythm, normal heart sounds and intact distal pulses.   Pulmonary/Chest: Effort normal and breath sounds normal.  Neurological: She is alert and oriented to person, place, and time.  Skin: Skin is warm and dry.  Psychiatric: She has a normal mood and affect. Her behavior is normal. Judgment and thought content normal.       Assessment & Plan:   Problem List Items Addressed This Visit      Other   Hyperlipidemia - Primary    Hyperlipidemia appears stable with current regimen and no adverse side effects or myalgias. Continue current dosage of pravastatin. Lipid profile at next office visit or during annual exam.      Relevant Medications   pravastatin (PRAVACHOL) 20 MG tablet   RESTLESS LEG SYNDROME    Restless leg syndrome worsening with current regimen. Indicates remains tolerable and would like to continue current dosage of ropinirole. Discussed possibility of addition of Lyrica and would like to hold off at this time. Follow-up if symptoms worsen.      Relevant Medications   ropinirole (REQUIP) 5 MG tablet

## 2015-10-18 NOTE — Assessment & Plan Note (Signed)
Restless leg syndrome worsening with current regimen. Indicates remains tolerable and would like to continue current dosage of ropinirole. Discussed possibility of addition of Lyrica and would like to hold off at this time. Follow-up if symptoms worsen.

## 2015-10-18 NOTE — Patient Instructions (Signed)
Thank you for choosing [facility].  Summary/Instructions:  Please continue to take your medications as prescribed.   Your prescription(s) have been submitted to your pharmacy or been printed and provided for you. Please take as directed and contact our office if you believe you are having problem(s) with the medication(s) or have any questions.  If your symptoms worsen or fail to improve, please contact our office for further instruction, or in case of emergency go directly to the emergency room at the closest medical facility.   Restless Legs Syndrome Restless legs syndrome is a condition that causes uncomfortable feelings or sensations in the legs, especially while sitting or lying down. The sensations usually cause an overwhelming urge to move the legs. The arms can also sometimes be affected. The condition can range from mild to severe. The symptoms often interfere with a person's ability to sleep. CAUSES The cause of this condition is not known. RISK FACTORS This condition is more likely to develop in:  People who are older than age 76.  Pregnant women. In general, restless legs syndrome is more common in women than in men.  People who have a family history of the condition.  People who have certain medical conditions, such as iron deficiency, kidney disease, Parkinson disease, or nerve damage.  People who take certain medicines, such as medicines for high blood pressure, nausea, colds, allergies, depression, and some heart conditions. SYMPTOMS The main symptom of this condition is uncomfortable sensations in the legs. These sensations may be:  Described as pulling, tingling, prickling, throbbing, crawling, or burning.  Worse while you are sitting or lying down.  Worse during periods of rest or inactivity.  Worse at night, often interfering with your sleep.  Accompanied by a very strong urge to move your legs.  Temporarily relieved by movement of your legs. The  sensations usually affect both sides of the body. The arms can also be affected, but this is rare. People who have this condition often have tiredness during the day because of their lack of sleep at night. DIAGNOSIS This condition may be diagnosed based on your description of the symptoms. You may also have tests, including blood tests, to check for other conditions that may lead to your symptoms. In some cases, you may be asked to spend some time in a sleep lab so your sleeping can be monitored. TREATMENT Treatment for this condition is focused on managing the symptoms. Treatment may include:  Self-help and lifestyle changes.  Medicines. HOME CARE INSTRUCTIONS  Take medicines only as directed by your health care provider.  Try these methods to get temporary relief from the uncomfortable sensations:  Massage your legs.  Walk or stretch.  Take a cold or hot bath.  Practice good sleep habits. For example, go to bed and get up at the same time every day.  Exercise regularly.  Practice ways of relaxing, such as yoga or meditation.  Avoid caffeine and alcohol.  Do not use any tobacco products, including cigarettes, chewing tobacco, or electronic cigarettes. If you need help quitting, ask your health care provider.  Keep all follow-up visits as directed by your health care provider. This is important. SEEK MEDICAL CARE IF: Your symptoms do not improve with treatment, or they get worse.   This information is not intended to replace advice given to you by your health care provider. Make sure you discuss any questions you have with your health care provider.   Document Released: 07/18/2002 Document Revised: 12/12/2014 Document Reviewed:  07/24/2014 Elsevier Interactive Patient Education [employer]

## 2015-11-22 DIAGNOSIS — H26493 Other secondary cataract, bilateral: Secondary | ICD-10-CM | POA: Diagnosis not present

## 2015-11-22 DIAGNOSIS — H524 Presbyopia: Secondary | ICD-10-CM | POA: Diagnosis not present

## 2015-11-27 ENCOUNTER — Ambulatory Visit (INDEPENDENT_AMBULATORY_CARE_PROVIDER_SITE_OTHER): Payer: Medicare Other

## 2015-11-27 ENCOUNTER — Ambulatory Visit (INDEPENDENT_AMBULATORY_CARE_PROVIDER_SITE_OTHER): Payer: Medicare Other | Admitting: Physician Assistant

## 2015-11-27 VITALS — BP 136/80 | HR 84 | Temp 97.9°F | Resp 16 | Ht 65.0 in | Wt 155.0 lb

## 2015-11-27 DIAGNOSIS — M79671 Pain in right foot: Secondary | ICD-10-CM

## 2015-11-27 MED ORDER — MELOXICAM 7.5 MG PO TABS: 7.5000 mg | ORAL_TABLET | Freq: Every day | ORAL | Status: DC

## 2015-11-27 NOTE — Patient Instructions (Addendum)
     IF you received an x-ray today, you will receive an invoice from [facility]. Please contact [facility] at [phone] with questions or concerns regarding your invoice.   IF you received labwork today, you will receive an invoice from [facility]. Please contact [facility] at [phone] with questions or concerns regarding your invoice.   Our billing staff will not be able to assist you with questions regarding bills from these companies.  You will be contacted with the lab results as soon as they are available. The fastest way to get your results is to activate your My Chart account. Instructions are located on the last page of this paperwork. If you have not heard from us regarding the results in 2 weeks, please contact this office.    The heel shows no fracture.  Please ice the eara 3 times per day.  You may opt to soak the foot in ice, rather than an ice pack.  Do not take the mobic with ibuprofen or naproxen.  You are able to do tylenol.

## 2015-11-27 NOTE — Progress Notes (Signed)
[facility] [address], [place] [address] [phone]  Date:  11/27/2015   Name:  Andrea Kelley   DOB:  [birth year]   MRN:  [id number]  PCP:  [name], MD    History of Present Illness:  Andrea Kelley is a 75 y.o. female patient who presents to [facility] for cc of heel pain  Chief Complaint  Patient presents with  . Foot Injury    right, painful, heel, some swelling at night   For 1 month the heel has been hurting.  It feels numb, and the foot is slightly swollen she thinks.  This is only spotted at night after a day of ambulation and exercise.  Pain when applying direct pressure.  She walks and exercises and fine during the day.  Late in the afternoon, it will hurt.  However, she also states it feels better with placing her feet on the ground rather than having her foot up.  She has no known redness.   HX of plantar fasciitis.  She denies any trauma.  No hx of stepping on foreign body.   Patient Active Problem List   Diagnosis Date Noted  . DDD (degenerative disc disease), lumbar 01/16/2011  . RESTLESS LEG SYNDROME 06/07/2009  . Hyperglycemia 06/07/2009  . Hyperlipidemia 07/19/2007  . Osteopenia 07/19/2007    Past Medical History  Diagnosis Date  . Osteopenia     [place] [facility]  . DDD (degenerative disc disease), lumbar   . Hyperlipidemia     Past Surgical History  Procedure Laterality Date  . Abdominal hysterectomy      For dysfunctional bleeding; no BSO  . Back surgery  1970    ruptured disc ; Dr Roxan Hockey  . Colonoscopy  2001    Negative; done for rectal bleeding    Social History  Substance Use Topics  . Smoking status: Never Smoker   . Smokeless tobacco: None  . Alcohol Use: Yes     Comment: Socially , 1-2 glasseswine / night    Family History  Problem Relation Age of Onset  . Lung cancer Father     smoker  . Heart attack Mother     89s  . Stroke Mother     in 37s  . Breast cancer Maternal Aunt   . Diabetes Neg Hx      Allergies  Allergen Reactions  . Sulfonamide Derivatives     REACTION: rash as child    Medication list has been reviewed and updated.  Current Outpatient Prescriptions on File Prior to Visit  Medication Sig Dispense Refill  . aspirin 81 MG tablet Take 81 mg by mouth daily.      . pravastatin (PRAVACHOL) 20 MG tablet Take 1 tablet (20 mg total) by mouth daily. 90 tablet 1  . ropinirole (REQUIP) 5 MG tablet Take 1 tablet (5 mg total) by mouth at bedtime. 90 tablet 1   No current facility-administered medications on file prior to visit.    ROS ROS otherwise unremarkable unless listed above.  Physical Examination: BP 136/80 mmHg  Pulse 84  Temp(Src) 97.9 F (36.6 C)  Resp 16  Ht  (1.651 m)  Wt 155 lb (70.308 kg)  BMI 25.79 kg/m2  SpO2 98% Ideal Body Weight: Weight in (lb) to have BMI = 25: 149.9  Physical Exam  Constitutional: She is oriented to person, place, and time. She appears well-developed and well-nourished. No distress.  HENT:  Head: Normocephalic and atraumatic.  Right Ear: External ear normal.  Left Ear: External ear normal.  Eyes: Conjunctivae and EOM are normal. Pupils are equal, round, and reactive to light.  Cardiovascular: Normal rate.   Pulmonary/Chest: Effort normal. No respiratory distress.  Musculoskeletal:       Right ankle: She exhibits normal range of motion and no swelling. No tenderness. No lateral malleolus and no medial malleolus tenderness found. Achilles tendon exhibits no pain, no defect and normal Thompson's test results.  No erythema or obvious swelling along the calcaneus.  No pain with palpation.  Pain with direct weight on heel.    Neurological: She is alert and oriented to person, place, and time.  Skin: She is not diaphoretic.  Psychiatric: She has a normal mood and affect. Her behavior is normal.   Dg Os Calcis Right  11/27/2015  CLINICAL DATA:  Heel pain, history of plantar fasciitis EXAM: RIGHT OS CALCIS - 2+ VIEW  COMPARISON:  None. FINDINGS: No abnormality of the calcaneus is seen on the single tangential view obtained. No opaque foreign body is noted. IMPRESSION: Negative. Electronically Signed   By: Dwyane DeePaul  Barry M.D.   On: 11/27/2015 09:05    Assessment and Plan: Andrea Kelley is a [age] y.o. female who is here today for right heel pain. This is possibly part of her hx of plantar fasciitis.  She may also have nerve entrapment or compression syndrome given hx of numbness by the end of the day.  I have advised heel cup.  Will use lower dose mobic once daily at most for 2 weeks.  She will also ice the area after exercise.  Advised to return in 2 weeks if pain continues.    Heel pain, right - Plan: DG Os Calcis Right, meloxicam (MOBIC) 7.5 MG tablet    Trena PlattStephanie English, PA-C [facility] 11/27/2015 8:26 AM

## 2016-04-08 ENCOUNTER — Encounter: Payer: Self-pay | Admitting: Family

## 2016-04-08 DIAGNOSIS — G2581 Restless legs syndrome: Secondary | ICD-10-CM

## 2016-04-08 MED ORDER — ROPINIROLE HCL 5 MG PO TABS: 5.0000 mg | ORAL_TABLET | Freq: Every day | ORAL | 0 refills | Status: DC

## 2016-04-29 ENCOUNTER — Other Ambulatory Visit: Payer: Self-pay | Admitting: Family

## 2016-04-29 DIAGNOSIS — E785 Hyperlipidemia, unspecified: Secondary | ICD-10-CM

## 2016-04-30 ENCOUNTER — Ambulatory Visit (INDEPENDENT_AMBULATORY_CARE_PROVIDER_SITE_OTHER): Payer: Medicare Other | Admitting: Physician Assistant

## 2016-04-30 DIAGNOSIS — Z23 Encounter for immunization: Secondary | ICD-10-CM | POA: Diagnosis not present

## 2016-04-30 NOTE — Progress Notes (Signed)
Patient was in for flu shot.

## 2016-05-23 IMAGING — CR DG OS CALCIS 2+V*R*
2 series · 2 of 2 positions shown · non-contrast
Comparison: None.

CLINICAL DATA: Heel pain, history of plantar fasciitis

EXAM:
RIGHT OS CALCIS - 2+ VIEW

[axial]
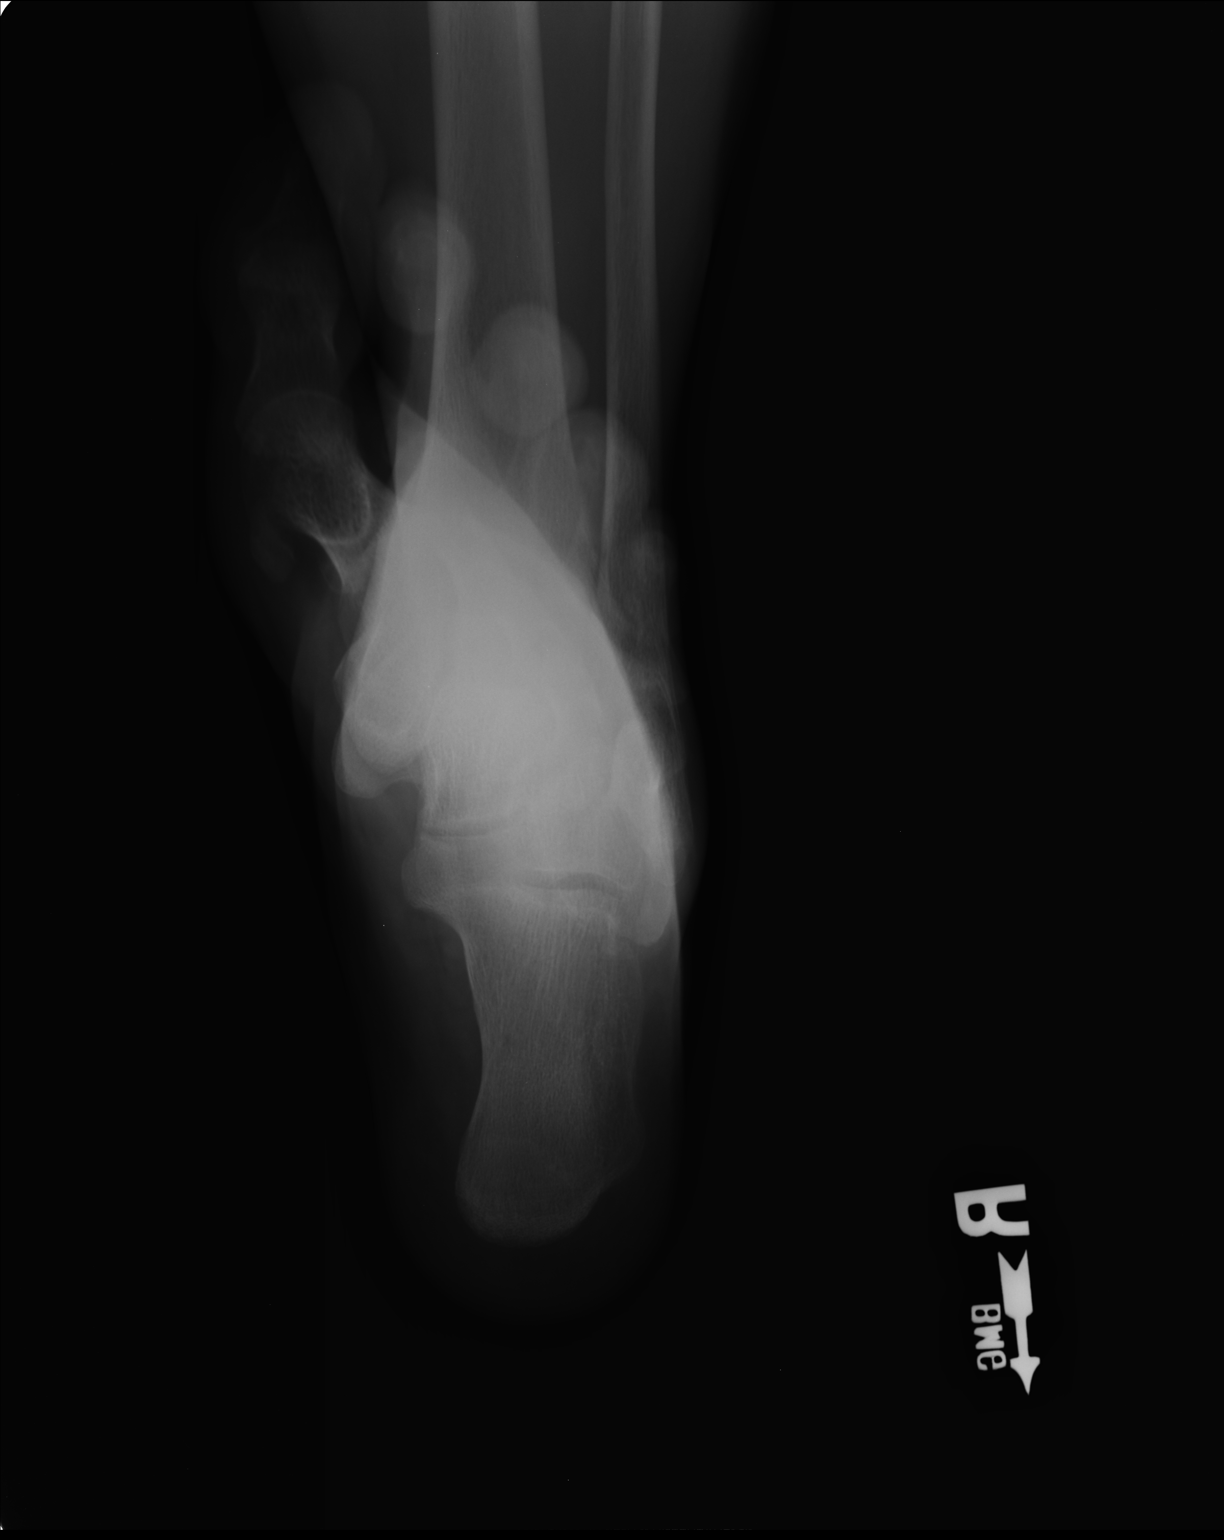

[lateral]
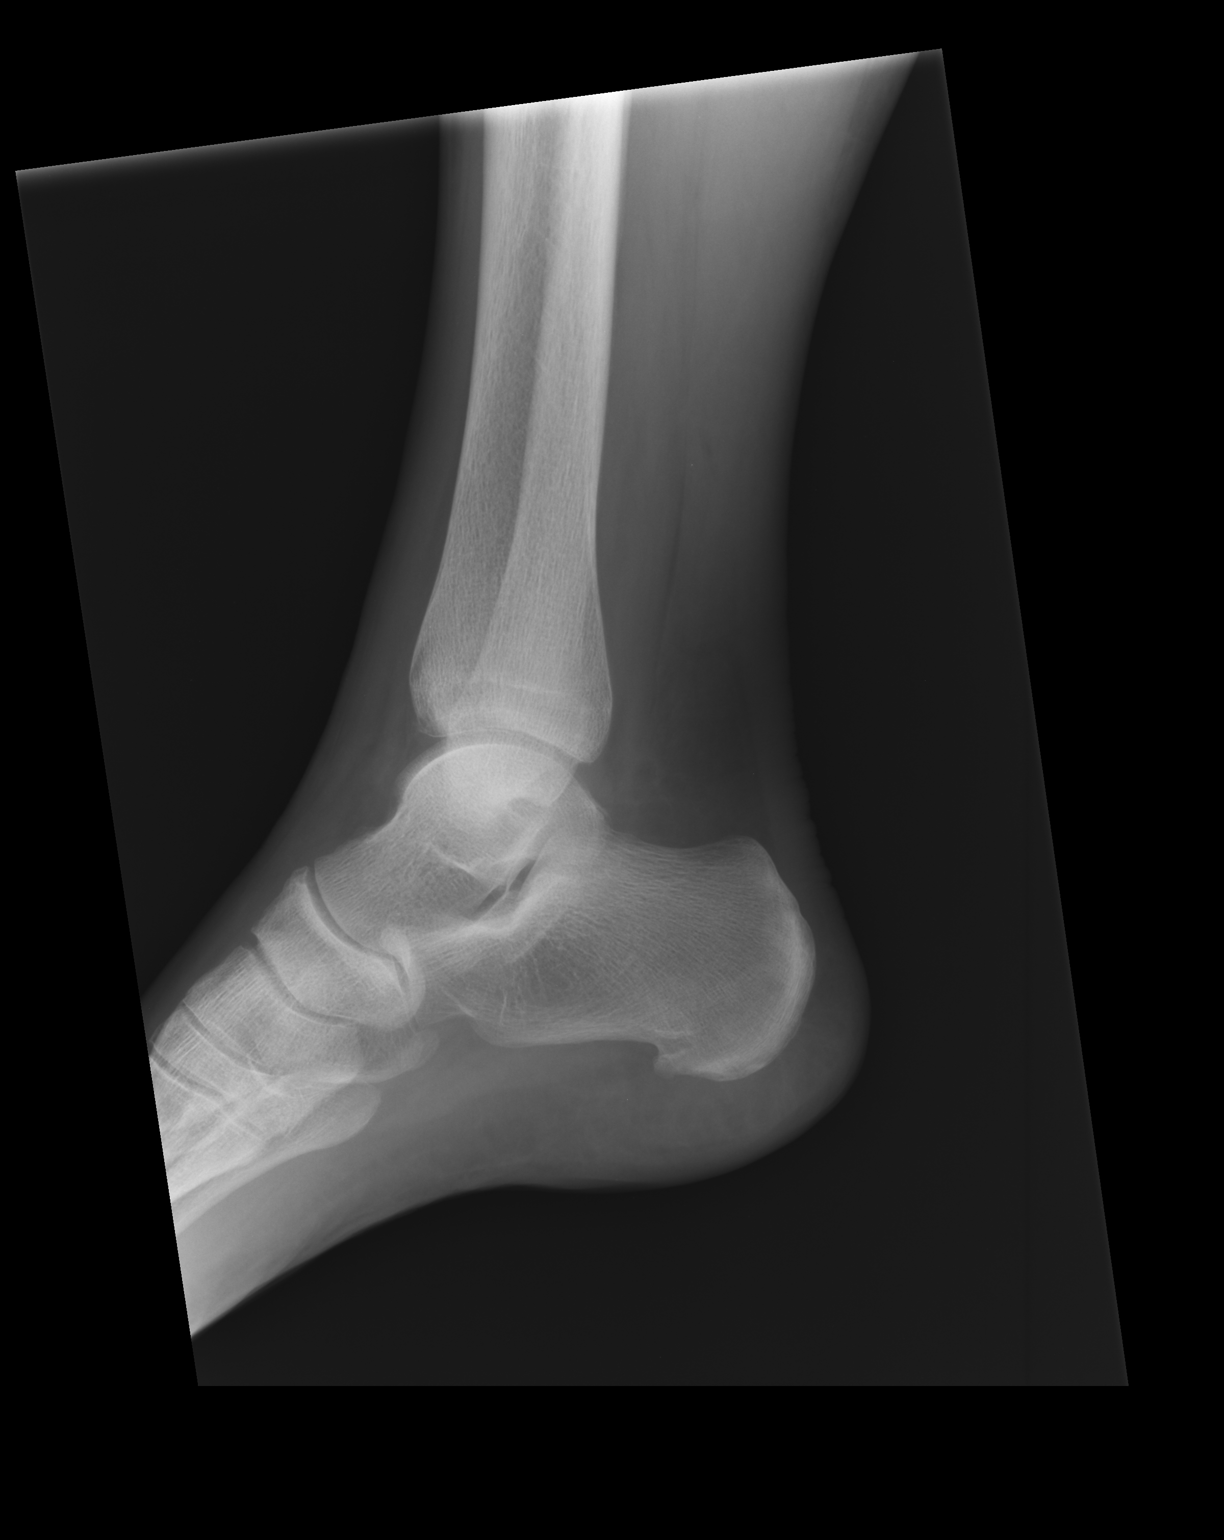

[2 of 2 positions shown; findings below may reference images not displayed]

FINDINGS: No abnormality of the calcaneus is seen on the single tangential
view obtained. No opaque foreign body is noted.
IMPRESSION: Negative.

## 2016-07-07 ENCOUNTER — Other Ambulatory Visit: Payer: Self-pay | Admitting: Family

## 2016-07-07 DIAGNOSIS — G2581 Restless legs syndrome: Secondary | ICD-10-CM

## 2016-10-06 ENCOUNTER — Other Ambulatory Visit: Payer: Self-pay | Admitting: Family

## 2016-10-06 DIAGNOSIS — G2581 Restless legs syndrome: Secondary | ICD-10-CM

## 2016-10-06 DIAGNOSIS — E785 Hyperlipidemia, unspecified: Secondary | ICD-10-CM

## 2016-10-10 ENCOUNTER — Ambulatory Visit (INDEPENDENT_AMBULATORY_CARE_PROVIDER_SITE_OTHER): Payer: Medicare Other | Admitting: Family

## 2016-10-10 VITALS — BP 124/76 | HR 76 | Temp 97.8°F | Resp 14 | Ht 65.0 in | Wt 151.8 lb

## 2016-10-10 DIAGNOSIS — G2581 Restless legs syndrome: Secondary | ICD-10-CM

## 2016-10-10 DIAGNOSIS — E782 Mixed hyperlipidemia: Secondary | ICD-10-CM | POA: Diagnosis not present

## 2016-10-10 MED ORDER — ROPINIROLE HCL 5 MG PO TABS: 5.0000 mg | ORAL_TABLET | Freq: Every day | ORAL | 1 refills | Status: DC

## 2016-10-10 NOTE — Progress Notes (Signed)
Subjective:    Patient ID: Andrea AlbinoJulia B Lamboy, female    DOB: 02-Aug-1941, 76 y.o.   MRN: [id number]  Chief Complaint  Patient presents with  . Medication Refill    ropinirole and pravastatin    HPI:  Andrea Kelley is a 76 y.o. female who  has a past medical history of DDD (degenerative disc disease), lumbar; Hyperlipidemia; and Osteopenia. and presents today for a follow up office visit.   1.) Hyperlipidemia - Reports taking the medication as prescribed and denies adverse side effects. Has been out of the medicaiton for about 1 month. When taking the medication as prescribed there is no adverse side effects or myalgias.  Lab Results  Component Value Date   CHOL 166 01/02/2015   HDL 75 01/02/2015   LDLCALC 82 01/02/2015   LDLDIRECT 172.6 01/16/2011   TRIG 47 01/02/2015   CHOLHDL 4 10/26/2013    2.) Resltess Leg syndrom - Continues to experience the associated symptoms of restless legs. Reports taking the medication as prescribed and denies adverse side effects. Obtains about 7 hours of sleep on average. Symtpoms are generally well controlled with the current medication regimen. No dizziness, fatigue or sleepiness with medication.     Allergies  Allergen Reactions  . Sulfonamide Derivatives     REACTION: rash as child      Outpatient Medications Prior to Visit  Medication Sig Dispense Refill  . pravastatin (PRAVACHOL) 20 MG tablet TAKE 1 TABLET BY MOUTH EVERY DAY 90 tablet 0  . aspirin 81 MG tablet Take 81 mg by mouth daily.      . meloxicam (MOBIC) 7.5 MG tablet Take 1 tablet (7.5 mg total) by mouth daily. 14 tablet 0  . ropinirole (REQUIP) 5 MG tablet TAKE 1 TABLET BY MOUTH EVERY NIGHT AT BEDTIME 90 tablet 0   No facility-administered medications prior to visit.      Review of Systems  Constitutional: Negative for chills and fever.  Respiratory: Negative for chest tightness and shortness of breath.   Cardiovascular: Negative for chest pain, palpitations and leg  swelling.  Psychiatric/Behavioral: Negative for sleep disturbance.      Objective:    BP 124/76 (BP Location: Left Arm, Patient Position: Sitting, Cuff Size: Normal)   Pulse 76   Temp 97.8 F (36.6 C) (Oral)   Resp 14   Ht 5\' 5"  (1.651 m)   Wt 151 lb 12.8 oz (68.9 kg)   SpO2 95%   BMI 25.26 kg/m  Nursing note and vital signs reviewed.  Physical Exam  Constitutional: She is oriented to person, place, and time. She appears well-developed and well-nourished. No distress.  Cardiovascular: Normal rate, regular rhythm, normal heart sounds and intact distal pulses.   Pulmonary/Chest: Effort normal and breath sounds normal.  Neurological: She is alert and oriented to person, place, and time.  Skin: Skin is warm and dry.  Psychiatric: She has a normal mood and affect. Her behavior is normal. Judgment and thought content normal.       Assessment & Plan:   Problem List Items Addressed This Visit      Other   Hyperlipidemia    Has been out of pravastatin 1 month. Obtain lipid profile to determine continued need. Continue with lifestyle management pending lipid profile results.      Relevant Orders   Lipid Profile   RESTLESS LEG SYNDROME - Primary    Symptoms of restless leg syndrome appear adequately control with current medication regimen and no adverse side  effects. Continue current dosage or Requip.      Relevant Medications   ropinirole (REQUIP) 5 MG tablet       I have discontinued Ms. Cordaro's aspirin and meloxicam. I have also changed her ropinirole. Additionally, I am having her maintain her pravastatin.   Meds ordered this encounter  Medications  . ropinirole (REQUIP) 5 MG tablet    Sig: Take 1 tablet (5 mg total) by mouth at bedtime.    Dispense:  90 tablet    Refill:  1    Order Specific Question:   Supervising Provider    Answer:   Hillard Danker A [[id number]]     Follow-up: Return in about 6 months (around 04/12/2017), or if symptoms worsen or fail to  improve.  Jeanine Luz, FNP

## 2016-10-10 NOTE — Patient Instructions (Signed)
Thank you for choosing [facility].  SUMMARY AND INSTRUCTIONS:  Medication:  Your prescription(s) have been submitted to your pharmacy or been printed and provided for you. Please take as directed and contact our office if you believe you are having problem(s) with the medication(s) or have any questions.  Labs:  Please stop by the lab on the lower level of the building for your blood work. Your results will be released to MyChart (or called to you) after review, usually within 72 hours after test completion. If any changes need to be made, you will be notified at that same time.  1.) The lab is open from 7:30am to 5:30 pm Monday-Friday 2.) No appointment is necessary 3.) Fasting (if needed) is 6-8 hours after food and drink; black coffee and water are okay   Follow up:  If your symptoms worsen or fail to improve, please contact our office for further instruction, or in case of emergency go directly to the emergency room at the closest medical facility.    Pneumococcal Conjugate Vaccine suspension for injection What is this medicine? PNEUMOCOCCAL VACCINE (NEU mo KOK al vak SEEN) is a vaccine used to prevent pneumococcus bacterial infections. These bacteria can cause serious infections like pneumonia, meningitis, and blood infections. This vaccine will lower your chance of getting pneumonia. If you do get pneumonia, it can make your symptoms milder and your illness shorter. This vaccine will not treat an infection and will not cause infection. This vaccine is recommended for infants and young children, adults with certain medical conditions, and adults 65 years or older. This medicine may be used for other purposes; ask your health care provider or pharmacist if you have questions. COMMON BRAND NAME(S): Prevnar, Prevnar 13 What should I tell my health care provider before I take this medicine? They need to know if you have any of these conditions: -bleeding problems -fever -immune  system problems -an unusual or allergic reaction to pneumococcal vaccine, diphtheria toxoid, other vaccines, latex, other medicines, foods, dyes, or preservatives -pregnant or trying to get pregnant -breast-feeding How should I use this medicine? This vaccine is for injection into a muscle. It is given by a health care professional. A copy of Vaccine Information Statements will be given before each vaccination. Read this sheet carefully each time. The sheet may change frequently. Talk to your pediatrician regarding the use of this medicine in children. While this drug may be prescribed for children as young as 386 weeks old for selected conditions, precautions do apply. Overdosage: If you think you have taken too much of this medicine contact a poison control center or emergency room at once. NOTE: This medicine is only for you. Do not share this medicine with others. What if I miss a dose? It is important not to miss your dose. Call your doctor or health care professional if you are unable to keep an appointment. What may interact with this medicine? -medicines for cancer chemotherapy -medicines that suppress your immune function -steroid medicines like prednisone or cortisone This list may not describe all possible interactions. Give your health care provider a list of all the medicines, herbs, non-prescription drugs, or dietary supplements you use. Also tell them if you smoke, drink alcohol, or use illegal drugs. Some items may interact with your medicine. What should I watch for while using this medicine? Mild fever and pain should go away in 3 days or less. Report any unusual symptoms to your doctor or health care professional. What side effects may  I notice from receiving this medicine? Side effects that you should report to your doctor or health care professional as soon as possible: -allergic reactions like skin rash, itching or hives, swelling of the face, lips, or tongue -breathing  problems -confused -fast or irregular heartbeat -fever over 102 degrees F -seizures -unusual bleeding or bruising -unusual muscle weakness Side effects that usually do not require medical attention (report to your doctor or health care professional if they continue or are bothersome): -aches and pains -diarrhea -fever of 102 degrees F or less -headache -irritable -loss of appetite -pain, tender at site where injected -trouble sleeping This list may not describe all possible side effects. Call your doctor for medical advice about side effects. You may report side effects to FDA at 1-800-FDA-1088. Where should I keep my medicine? This does not apply. This vaccine is given in a clinic, pharmacy, doctor's office, or other health care setting and will not be stored at home. NOTE: This sheet is a summary. It may not cover all possible information. If you have questions about this medicine, talk to your doctor, pharmacist, or health care provider.  2018 Elsevier/Gold Standard (2014-05-04 10:27:27)

## 2016-10-10 NOTE — Assessment & Plan Note (Signed)
Symptoms of restless leg syndrome appear adequately control with current medication regimen and no adverse side effects. Continue current dosage or Requip.

## 2016-10-10 NOTE — Assessment & Plan Note (Signed)
Has been out of pravastatin 1 month. Obtain lipid profile to determine continued need. Continue with lifestyle management pending lipid profile results.

## 2016-10-14 ENCOUNTER — Other Ambulatory Visit (INDEPENDENT_AMBULATORY_CARE_PROVIDER_SITE_OTHER): Payer: Medicare Other

## 2016-10-14 ENCOUNTER — Encounter: Payer: Self-pay | Admitting: Family

## 2016-10-14 DIAGNOSIS — E782 Mixed hyperlipidemia: Secondary | ICD-10-CM | POA: Diagnosis not present

## 2016-10-14 LAB — LIPID PANEL
Cholesterol: 188 mg/dL (ref 0–200)
HDL: 38.1 mg/dL — ABNORMAL LOW (ref >39.00)
Total CHOL/HDL Ratio: 5
Triglycerides: 403 mg/dL — ABNORMAL HIGH (ref 0.0–149.0)

## 2016-10-14 LAB — LDL CHOLESTEROL, DIRECT: LDL DIRECT: 90 mg/dL

## 2016-10-14 MED ORDER — PRAVASTATIN SODIUM 20 MG PO TABS: 20.0000 mg | ORAL_TABLET | Freq: Every day | ORAL | 0 refills | Status: DC

## 2016-11-27 DIAGNOSIS — H5202 Hypermetropia, left eye: Secondary | ICD-10-CM | POA: Diagnosis not present

## 2016-12-26 ENCOUNTER — Other Ambulatory Visit (INDEPENDENT_AMBULATORY_CARE_PROVIDER_SITE_OTHER): Payer: Medicare Other

## 2016-12-26 ENCOUNTER — Encounter: Payer: Self-pay | Admitting: Family

## 2016-12-26 DIAGNOSIS — E782 Mixed hyperlipidemia: Secondary | ICD-10-CM

## 2016-12-26 LAB — LIPID PANEL
CHOL/HDL RATIO: 3
Cholesterol: 151 mg/dL (ref 0–200)
HDL: 46.2 mg/dL (ref >39.00)
LDL CALC: 80 mg/dL (ref 0–99)
NonHDL: 105.06
TRIGLYCERIDES: 127 mg/dL (ref 0.0–149.0)
VLDL: 25.4 mg/dL (ref 0.0–40.0)

## 2017-01-07 ENCOUNTER — Other Ambulatory Visit: Payer: Self-pay | Admitting: Family

## 2017-01-07 DIAGNOSIS — E782 Mixed hyperlipidemia: Secondary | ICD-10-CM

## 2017-01-26 ENCOUNTER — Other Ambulatory Visit (INDEPENDENT_AMBULATORY_CARE_PROVIDER_SITE_OTHER): Payer: Medicare Other

## 2017-01-26 ENCOUNTER — Encounter: Payer: Self-pay | Admitting: Family Medicine

## 2017-01-26 ENCOUNTER — Ambulatory Visit (INDEPENDENT_AMBULATORY_CARE_PROVIDER_SITE_OTHER): Payer: Medicare Other | Admitting: Family Medicine

## 2017-01-26 VITALS — BP 148/86 | HR 84 | Temp 97.9°F | Wt 157.0 lb

## 2017-01-26 DIAGNOSIS — R55 Syncope and collapse: Secondary | ICD-10-CM

## 2017-01-26 LAB — COMPREHENSIVE METABOLIC PANEL
ALT: 20 U/L (ref 0–35)
AST: 17 U/L (ref 0–37)
Albumin: 4.2 g/dL (ref 3.5–5.2)
Alkaline Phosphatase: 80 U/L (ref 39–117)
BUN: 16 mg/dL (ref 6–23)
CO2: 24 mEq/L (ref 19–32)
Calcium: 9.5 mg/dL (ref 8.4–10.5)
Chloride: 107 mEq/L (ref 96–112)
Creatinine, Ser: 0.82 mg/dL (ref 0.40–1.20)
GFR: 71.86 mL/min (ref >60.00)
Glucose, Bld: 99 mg/dL (ref 70–99)
Potassium: 4.1 mEq/L (ref 3.5–5.1)
Sodium: 138 mEq/L (ref 135–145)
Total Bilirubin: 0.3 mg/dL (ref 0.2–1.2)
Total Protein: 6.8 g/dL (ref 6.0–8.3)

## 2017-01-26 LAB — CBC
HCT: 42.1 % (ref 36.0–46.0)
HEMOGLOBIN: 14.4 g/dL (ref 12.0–15.0)
MCHC: 34.2 g/dL (ref 30.0–36.0)
MCV: 91.3 fl (ref 78.0–100.0)
Platelets: 340 10*3/uL (ref 150.0–400.0)
RBC: 4.61 Mil/uL (ref 3.87–5.11)
RDW: 12.7 % (ref 11.5–15.5)
WBC: 9.8 10*3/uL (ref 4.0–10.5)

## 2017-01-26 NOTE — Patient Instructions (Signed)
Please go to the lab  I will notify you of lab results  I have referred you to cardiology- you will receive a call regarding an appointment. If you have further symptoms, please let me or Andrea Kelley know.

## 2017-01-26 NOTE — Progress Notes (Signed)
\  Subjective:    Patient ID: Andrea Kelley, female    DOB: 1940-10-20, 76 y.o.   MRN: [id number]  HPI This is a 76 yo female, accompanied by her husband, who presents today following an episode of syncope that occurred 3 days ago.  She had a sudden onset of nausea, clammy, no palpitation while playing cards at the dining room table. "Slumped over." Has happened in the past (very sporadic for several years), last episode 5 days prior. Husband reports that she was totally unresponsive with her head on the table for 2-3 minutes. EMS was called and did EKG and blood sugar both of which were normal according to the patient. Blood pressure was 150s. Following the episode, she went to bed, felt a little weak. No sensation of having to move her bowels. Has felt a little more tired since episode but not sure if that was related to getting ready for out of town company.   No chest pain, SOB or palpitations with walking.   Past Medical History:  Diagnosis Date  . DDD (degenerative disc disease), lumbar   . Hyperlipidemia   . Osteopenia    [facility]   Past Surgical History:  Procedure Laterality Date  . ABDOMINAL HYSTERECTOMY     For dysfunctional bleeding; no BSO  . BACK SURGERY  1970   ruptured disc ; Dr Roxan Hockeyobinson  . COLONOSCOPY  2001   Negative; done for rectal bleeding   Family History  Problem Relation Age of Onset  . Lung cancer Father        smoker  . Heart attack Mother        5570s  . Stroke Mother        in 4880s  . Breast cancer Maternal Aunt   . Diabetes Neg Hx    Social History  Substance Use Topics  . Smoking status: Never Smoker  . Smokeless tobacco: Never Used  . Alcohol use Yes     Comment: Socially , 1-2 glasseswine / night      Review of Systems  Constitutional: Positive for fatigue (immediately following episode of syncope). Negative for fever and unexpected weight change.  Eyes: Negative.   Respiratory: Negative for cough, chest tightness and shortness  of breath.   Cardiovascular: Negative for chest pain, palpitations and leg swelling.  Gastrointestinal: Negative for abdominal pain, blood in stool, constipation, diarrhea, nausea and vomiting.  Neurological: Positive for syncope. Negative for dizziness, weakness, light-headedness and headaches.       Objective:   Physical Exam Physical Exam  Constitutional: Oriented to person, place, and time. She appears well-developed and well-nourished.  HENT:  Head: Normocephalic and atraumatic.  Eyes: Conjunctivae are normal. PERRLA. Neck: Normal range of motion. Neck supple.  Cardiovascular: Normal rate, regular rhythm and normal heart sounds.No pedal edema.    Pulmonary/Chest: Effort normal and breath sounds normal.  Musculoskeletal: Normal range of motion.  Neurological: Alert and oriented to person, place, and time.  Skin: Skin is warm and dry.  Psychiatric: Normal mood and affect. Behavior is normal. Judgment and thought content normal.  Vitals reviewed.  BP (!) 148/86 (BP Location: Left Arm, Patient Position: Sitting, Cuff Size: Normal)   Pulse 84   Temp 97.9 F (36.6 C) (Oral)   Wt 157 lb (71.2 kg)   SpO2 97%   BMI 26.13 kg/m  Wt Readings from Last 3 Encounters:  01/26/17 157 lb (71.2 kg)  10/10/16 151 lb 12.8 oz (68.9 kg)  11/27/15 155 lb (  70.3 kg)   EKG- NSR, rate 68 with one PVC    Assessment & Plan:  1. Syncope, unspecified syncope type - instructed her to notify office if she has further episodes - EKG 12-Lead - CBC; Future - Comprehensive metabolic panel; Future - Ambulatory referral to Cardiology  Olean Ree, FNP-BC  [facility] [place], [facility]  01/27/2017 5:45 PM

## 2017-01-29 ENCOUNTER — Encounter: Payer: Self-pay | Admitting: Family Medicine

## 2017-02-09 ENCOUNTER — Ambulatory Visit (INDEPENDENT_AMBULATORY_CARE_PROVIDER_SITE_OTHER): Payer: Medicare Other | Admitting: Physician Assistant

## 2017-02-09 ENCOUNTER — Encounter: Payer: Self-pay | Admitting: Physician Assistant

## 2017-02-09 VITALS — BP 126/80 | HR 78 | Ht 67.0 in | Wt 156.0 lb

## 2017-02-09 DIAGNOSIS — R55 Syncope and collapse: Secondary | ICD-10-CM | POA: Insufficient documentation

## 2017-02-09 NOTE — Progress Notes (Signed)
Cardiology Office Note   Date:  02/09/2017   ID:  Andrea Kelley, DOB 1940/12/08, MRN [id number]  PCP:  Veryl Speakalone, Gregory D, FNP  Cardiologist:  [place], Dr Lauraine RinneHarding Rhesa Forsberg, PA-C   Chief Complaint  Patient presents with  . Follow-up  . Chest Pain    History of Present Illness: Andrea Kelley is a 76 y.o. female with a history of DDD, HLD, osteopenia, restless legs  Evaluated by PCP after an episode of syncope and appointment made  Andrea Kelley presents for cardiology evaluation.  She has had some spells where she will feel nauseated and light-headed. She will then go to bed. They have only happened in the evening. She feels better when she goes to bed and lies. The symptoms will resolve shortly after she lies down. She will get them 1-2 x year.   She had one a few weeks ago, June 9th, her usual sx. However, she had another one with syncope on June 15th. She had been busy that day cleaning house, but not outside. She had good po intake with both food and liquids. No excess ETOH. She was playing cards with family, sitting at a table (for at least an hour). She started feeling nauseated and light-headed.   She did not have time to tell anyone how she felt, went face down on the table, dropping her cards. Family tried to rouse her, not immediately successful. 911 was called but pt woke up in a minute or 2. When she woke up, she knew who and where she was. However, she still felt dizzy. Nausea had almost gone. She was light-headed, but was able to walk unaided.   She laid down, EMS arrived, at that time, her ECG was not acute, BP/HR/CBG checked. SBP 150s, HR elevated but not too much, CBG ok. She followed up with her PCP, blood work was unremarkable and cards referral made.   Pt never has palpitations. She had a PVC on her ECG today, but did not feel it.   No falls in > 1 year, the last one was mechanical. She walks for exercise, no chest pain or SOB. She pushes the trash can  to the street, that is the most strenuous thing she does. No CP or SOB with this. No orthopnea, no PND, no LE edema. Does not snore.    Past Medical History:  Diagnosis Date  . DDD (degenerative disc disease), lumbar   . Hyperlipidemia   . Osteopenia    [place] [facility]  . Syncope     Past Surgical History:  Procedure Laterality Date  . ABDOMINAL HYSTERECTOMY     For dysfunctional bleeding; no BSO  . BACK SURGERY  1970   ruptured disc ; Dr Roxan Hockeyobinson  . COLONOSCOPY  2001   Negative; done for rectal bleeding    Current Outpatient Prescriptions  Medication Sig Dispense Refill  . pravastatin (PRAVACHOL) 20 MG tablet TAKE 1 TABLET(20 MG) BY MOUTH DAILY 90 tablet 1  . ropinirole (REQUIP) 5 MG tablet Take 1 tablet (5 mg total) by mouth at bedtime. 90 tablet 1   No current facility-administered medications for this visit.     Allergies:   Sulfonamide derivatives    Social History:  The patient  reports that she has never smoked. She has never used smokeless tobacco. She reports that she drinks alcohol. She reports that she does not use drugs.   Family History:  The patient's family history includes Breast cancer in her maternal  aunt; Heart attack in her mother; Lung cancer in her father; Stroke in her mother.    ROS:  Please see the history of present illness. All other systems are reviewed and negative.   PHYSICAL EXAM: VS:  BP 126/80   Pulse 78   Ht 5\' 7"  (1.702 m)   Wt 156 lb (70.8 kg)   BMI 24.43 kg/m  , BMI Body mass index is 24.43 kg/m. GEN: Well nourished, well developed, female in no acute distress  HEENT: normal for age  Neck: no JVD, no carotid bruit, no masses Cardiac: RRR; minimal murmur, no rubs, or gallops Respiratory:  clear to auscultation bilaterally, normal work of breathing GI: soft, nontender, nondistended, + BS MS: no deformity or atrophy; no edema; distal pulses are 2+ in all 4 extremities   Skin: warm and dry, no rash Neuro:  Strength and sensation  are intact Psych: euthymic mood, full affect   EKG:  EKG is ordered today. The ekg ordered today demonstrates SR, HR 78, no acute changes, normal intervals. PVC seen.   Recent Labs: 01/26/2017: ALT 20; BUN 16; Creatinine, Ser 0.82; Hemoglobin 14.4; Platelets 340.0; Potassium 4.1; Sodium 138    Lipid Panel    Component Value Date/Time   CHOL 151 12/26/2016 0747   CHOL 166 01/02/2015 0957   TRIG 127.0 12/26/2016 0747   TRIG 47 01/02/2015 0957   HDL 46.20 12/26/2016 0747   HDL 75 01/02/2015 0957   CHOLHDL 3 12/26/2016 0747   VLDL 25.4 12/26/2016 0747   LDLCALC 80 12/26/2016 0747   LDLCALC 82 01/02/2015 0957   LDLDIRECT 90.0 10/14/2016 0730     Wt Readings from Last 3 Encounters:  02/09/17 156 lb (70.8 kg)  01/26/17 157 lb (71.2 kg)  10/10/16 151 lb 12.8 oz (68.9 kg)     Other studies Reviewed: Additional studies/ records that were reviewed today include: PCP notes.  ASSESSMENT AND PLAN:Pt discussed with Dr Herbie BaltimoreHarding, who agrees with the plan.  1.  Syncope: Advised pt and husband that [place] law states she should not drive x 6 months.   Events concerning for cardiac origin. Pt needs monitor to eval, recommend ILR since sx generally occur infrequently. Can also ck echo for structural abnormalities. No meds needed.   No sx c/w angina, CHF or sleep apnea.   Current medicines are reviewed at length with the patient today.  The patient does not have concerns regarding medicines.  The following changes have been made:  no change  Labs/ tests ordered today include:  No orders of the defined types were placed in this encounter.    Disposition:   FU with Dr Herbie BaltimoreHarding  Signed, Theodore DemarkBarrett, Ignazio Kincaid, PA-C  02/09/2017 10:41 AM    [facility] Phone: [phone]; Fax: [phone]  This note was written with the assistance of speech recognition software. Please excuse any transcriptional errors.

## 2017-02-09 NOTE — Patient Instructions (Addendum)
Medication Instructions:  Your physician recommends that you continue on your current medications as directed. Please refer to the Current Medication list given to you today.  Labwork: None   Testing/Procedures: Loop recorder--OUR OFFICE WILL CONTACT YOU WITH INSTRUCTIONS ONCE WE SPEAK WITH DR CROITORU  Your physician has requested that you have an echocardiogram. Echocardiography is a painless test that uses sound waves to create images of your heart. It provides your doctor with information about the size and shape of your heart and how well your heart's chambers and valves are working. This procedure takes approximately one hour. There are no restrictions for this procedure.  Follow-Up: Your physician recommends that you schedule a follow-up appointment in: 3 MONTHS with DR [facility]  Any Other Special Instructions Will Be Listed Below (If Applicable).  If you need a refill on your cardiac medications before your next appointment, please call your pharmacy.      Spoke with Patient, informed her that the Loop recorder has been scheduled for Monday July 9th at 1:30pm and she needs to be there no later than 12:30pm. Patient voiced understanding. Terance Hartee Andrea Kelley, CMA 02/09/2017 1:32pm

## 2017-02-12 SURGERY — LOOP RECORDER INSERTION
Anesthesia: LOCAL

## 2017-02-16 ENCOUNTER — Encounter (HOSPITAL_COMMUNITY): Payer: Self-pay | Admitting: Cardiovascular Disease

## 2017-02-16 ENCOUNTER — Ambulatory Visit (HOSPITAL_COMMUNITY)
Admission: RE | Admit: 2017-02-16 | Discharge: 2017-02-16 | Disposition: A | Discharge status: Home / Self Care | Payer: Medicare Other | Source: Ambulatory Visit | Attending: Cardiovascular Disease | Admitting: Cardiovascular Disease

## 2017-02-16 ENCOUNTER — Encounter (HOSPITAL_COMMUNITY)
Admission: RE | Disposition: A | Discharge status: Home / Self Care | Payer: Self-pay | Source: Ambulatory Visit | Attending: Cardiovascular Disease

## 2017-02-16 DIAGNOSIS — Z882 Allergy status to sulfonamides status: Secondary | ICD-10-CM | POA: Insufficient documentation

## 2017-02-16 DIAGNOSIS — R55 Syncope and collapse: Secondary | ICD-10-CM | POA: Diagnosis present

## 2017-02-16 DIAGNOSIS — E785 Hyperlipidemia, unspecified: Secondary | ICD-10-CM | POA: Insufficient documentation

## 2017-02-16 DIAGNOSIS — M5136 Other intervertebral disc degeneration, lumbar region: Secondary | ICD-10-CM | POA: Diagnosis not present

## 2017-02-16 DIAGNOSIS — G2581 Restless legs syndrome: Secondary | ICD-10-CM | POA: Insufficient documentation

## 2017-02-16 DIAGNOSIS — M8580 Other specified disorders of bone density and structure, unspecified site: Secondary | ICD-10-CM | POA: Diagnosis not present

## 2017-02-16 HISTORY — PX: LOOP RECORDER INSERTION: EP1214

## 2017-02-16 SURGERY — LOOP RECORDER INSERTION
Anesthesia: LOCAL

## 2017-02-16 MED ORDER — LIDOCAINE-EPINEPHRINE 1 %-1:100000 IJ SOLN
INTRAMUSCULAR | Status: DC | PRN
  Administered 2017-02-16: 10 mL

## 2017-02-16 MED ORDER — LIDOCAINE-EPINEPHRINE 1 %-1:100000 IJ SOLN
INTRAMUSCULAR | Status: AC
  Filled 2017-02-16: qty 2

## 2017-02-16 SURGICAL SUPPLY — 2 items
LOOP REVEAL LINQSYS (Prosthesis & Implant Heart) ×2 IMPLANT
PACK LOOP INSERTION (CUSTOM PROCEDURE TRAY) ×2

## 2017-02-16 NOTE — Op Note (Signed)
LOOP RECORDER IMPLANT   Procedure report  Procedure performed:  Loop recorder implantation   Reason for procedure:  1. Recurrent syncope/near-syncope Procedure performed by:  Thurmon FairMihai Merida Alcantar, MD  Complications:  None  Estimated blood loss:  <5 mL  Medications administered during procedure:  Lidocaine 1% with 1/10,000 epinephrine 10 mL locally Device details:  Medtronic Reveal Linq model number [id number], serial number [id number] Procedure details:  After the risks and benefits of the procedure were discussed the patient provided informed consent. The patient was prepped and draped in usual sterile fashion. Local anesthesia was administered to an area 2 cm to the left of the sternum in the 4th intercostal space. A cutaneous incision was made using the incision tool. The introducer was then used to create a subcutaneous tunnel and carefully deploy the device. Local pressure was held to ensure hemostasis.  The incision was closed with SteriStrips and a sterile dressing was applied.  R waves 0.5 mV.  Thurmon FairMihai Venna Berberich, MD, [facility]ACC [facility] [phone] office [phone] pager 02/16/2017 2:04 PM

## 2017-02-16 NOTE — Interval H&P Note (Signed)
History and Physical Interval Note:  02/16/2017 1:23 PM  Andrea Kelley  has presented today for surgery, with the diagnosis of syncope  The various methods of treatment have been discussed with the patient and family. After consideration of risks, benefits and other options for treatment, the patient has consented to  Procedure(s): Loop Recorder Insertion (N/A) as a surgical intervention .  The patient's history has been reviewed, patient examined, no change in status, stable for surgery.  I have reviewed the patient's chart and labs.  Questions were answered to the patient's satisfaction.     Lorynn Moeser

## 2017-02-16 NOTE — H&P (View-Only) (Signed)
Cardiology Office Note   Date:  02/09/2017   ID:  Andrea Kelley, DOB 1940/12/08, MRN [id number]  PCP:  Veryl Speakalone, Gregory D, FNP  Cardiologist:  New, Dr Lauraine RinneHarding Kaspar Albornoz, PA-C   Chief Complaint  Patient presents with  . Follow-up  . Chest Pain    History of Present Illness: Andrea Kelley is a 76 y.o. female with a history of DDD, HLD, osteopenia, restless legs  Evaluated by PCP after an episode of syncope and appointment made  Andrea Kelley presents for cardiology evaluation.  She has had some spells where she will feel nauseated and light-headed. She will then go to bed. They have only happened in the evening. She feels better when she goes to bed and lies. The symptoms will resolve shortly after she lies down. She will get them 1-2 x year.   She had one a few weeks ago, June 9th, her usual sx. However, she had another one with syncope on June 15th. She had been busy that day cleaning house, but not outside. She had good po intake with both food and liquids. No excess ETOH. She was playing cards with family, sitting at a table (for at least an hour). She started feeling nauseated and light-headed.   She did not have time to tell anyone how she felt, went face down on the table, dropping her cards. Family tried to rouse her, not immediately successful. 911 was called but pt woke up in a minute or 2. When she woke up, she knew who and where she was. However, she still felt dizzy. Nausea had almost gone. She was light-headed, but was able to walk unaided.   She laid down, EMS arrived, at that time, her ECG was not acute, BP/HR/CBG checked. SBP 150s, HR elevated but not too much, CBG ok. She followed up with her PCP, blood work was unremarkable and cards referral made.   Pt never has palpitations. She had a PVC on her ECG today, but did not feel it.   No falls in > 1 year, the last one was mechanical. She walks for exercise, no chest pain or SOB. She pushes the trash can  to the street, that is the most strenuous thing she does. No CP or SOB with this. No orthopnea, no PND, no LE edema. Does not snore.    Past Medical History:  Diagnosis Date  . DDD (degenerative disc disease), lumbar   . Hyperlipidemia   . Osteopenia    [facility]  . Syncope     Past Surgical History:  Procedure Laterality Date  . ABDOMINAL HYSTERECTOMY     For dysfunctional bleeding; no BSO  . BACK SURGERY  1970   ruptured disc ; Dr Roxan Hockeyobinson  . COLONOSCOPY  2001   Negative; done for rectal bleeding    Current Outpatient Prescriptions  Medication Sig Dispense Refill  . pravastatin (PRAVACHOL) 20 MG tablet TAKE 1 TABLET(20 MG) BY MOUTH DAILY 90 tablet 1  . ropinirole (REQUIP) 5 MG tablet Take 1 tablet (5 mg total) by mouth at bedtime. 90 tablet 1   No current facility-administered medications for this visit.     Allergies:   Sulfonamide derivatives    Social History:  The patient  reports that she has never smoked. She has never used smokeless tobacco. She reports that she drinks alcohol. She reports that she does not use drugs.   Family History:  The patient's family history includes Breast cancer in her maternal  aunt; Heart attack in her mother; Lung cancer in her father; Stroke in her mother.    ROS:  Please see the history of present illness. All other systems are reviewed and negative.   PHYSICAL EXAM: VS:  BP 126/80   Pulse 78   Ht 5\' 7"  (1.702 m)   Wt 156 lb (70.8 kg)   BMI 24.43 kg/m  , BMI Body mass index is 24.43 kg/m. GEN: Well nourished, well developed, female in no acute distress  HEENT: normal for age  Neck: no JVD, no carotid bruit, no masses Cardiac: RRR; minimal murmur, no rubs, or gallops Respiratory:  clear to auscultation bilaterally, normal work of breathing GI: soft, nontender, nondistended, + BS MS: no deformity or atrophy; no edema; distal pulses are 2+ in all 4 extremities   Skin: warm and dry, no rash Neuro:  Strength and sensation  are intact Psych: euthymic mood, full affect   EKG:  EKG is ordered today. The ekg ordered today demonstrates SR, HR 78, no acute changes, normal intervals. PVC seen.   Recent Labs: 01/26/2017: ALT 20; BUN 16; Creatinine, Ser 0.82; Hemoglobin 14.4; Platelets 340.0; Potassium 4.1; Sodium 138    Lipid Panel    Component Value Date/Time   CHOL 151 12/26/2016 0747   CHOL 166 01/02/2015 0957   TRIG 127.0 12/26/2016 0747   TRIG 47 01/02/2015 0957   HDL 46.20 12/26/2016 0747   HDL 75 01/02/2015 0957   CHOLHDL 3 12/26/2016 0747   VLDL 25.4 12/26/2016 0747   LDLCALC 80 12/26/2016 0747   LDLCALC 82 01/02/2015 0957   LDLDIRECT 90.0 10/14/2016 0730     Wt Readings from Last 3 Encounters:  02/09/17 156 lb (70.8 kg)  01/26/17 157 lb (71.2 kg)  10/10/16 151 lb 12.8 oz (68.9 kg)     Other studies Reviewed: Additional studies/ records that were reviewed today include: PCP notes.  ASSESSMENT AND PLAN:Pt discussed with Dr Herbie BaltimoreHarding, who agrees with the plan.  1.  Syncope: Advised pt and husband that [place] law states she should not drive x 6 months.   Events concerning for cardiac origin. Pt needs monitor to eval, recommend ILR since sx generally occur infrequently. Can also ck echo for structural abnormalities. No meds needed.   No sx c/w angina, CHF or sleep apnea.   Current medicines are reviewed at length with the patient today.  The patient does not have concerns regarding medicines.  The following changes have been made:  no change  Labs/ tests ordered today include:  No orders of the defined types were placed in this encounter.    Disposition:   FU with Dr Herbie BaltimoreHarding  Signed, Theodore DemarkBarrett, Raynette Arras, PA-C  02/09/2017 10:41 AM    [facility] Phone: [phone]; Fax: [phone]  This note was written with the assistance of speech recognition software. Please excuse any transcriptional errors.

## 2017-02-17 ENCOUNTER — Other Ambulatory Visit: Payer: Self-pay

## 2017-02-17 ENCOUNTER — Ambulatory Visit (HOSPITAL_COMMUNITY): Payer: Medicare Other | Attending: Internal Medicine

## 2017-02-17 DIAGNOSIS — R55 Syncope and collapse: Secondary | ICD-10-CM | POA: Diagnosis present

## 2017-02-17 DIAGNOSIS — E785 Hyperlipidemia, unspecified: Secondary | ICD-10-CM | POA: Diagnosis not present

## 2017-02-27 ENCOUNTER — Ambulatory Visit (INDEPENDENT_AMBULATORY_CARE_PROVIDER_SITE_OTHER): Payer: Self-pay | Admitting: *Deleted

## 2017-02-27 DIAGNOSIS — R55 Syncope and collapse: Secondary | ICD-10-CM

## 2017-02-27 LAB — CUP PACEART INCLINIC DEVICE CHECK
Date Time Interrogation Session: 20180720113007
Implantable Pulse Generator Implant Date: 20180709

## 2017-02-27 NOTE — Progress Notes (Signed)
Wound check appointment s/p ILR implant. Steri-strips removed. Wound without redness or edema. Incision edges approximated, wound well healed. Battery status: GOOD. R-waves 0.4429mV. 1 symptom episodes (per patient, MDT rep in hospital education), 0 tachy episodes, 0 pause episodes, 0 brady episodes. 0 AF episodes (0% burden). Patient educated on wound care. Monthly summary reports and ROV with Dr. Herbie BaltimoreHarding 05/18/17.

## 2017-02-27 NOTE — Patient Instructions (Signed)
Please Call the Device Clinic if any further questions or concerns  [phone]

## 2017-03-18 ENCOUNTER — Ambulatory Visit (INDEPENDENT_AMBULATORY_CARE_PROVIDER_SITE_OTHER): Payer: Medicare Other | Admitting: *Deleted

## 2017-03-18 DIAGNOSIS — R55 Syncope and collapse: Secondary | ICD-10-CM | POA: Diagnosis not present

## 2017-03-19 NOTE — Progress Notes (Signed)
Carelink Summary Report / Loop Recorder 

## 2017-03-26 LAB — CUP PACEART REMOTE DEVICE CHECK
Date Time Interrogation Session: 20180808173543
Implantable Pulse Generator Implant Date: 20180709

## 2017-03-27 ENCOUNTER — Other Ambulatory Visit: Payer: Self-pay

## 2017-04-09 ENCOUNTER — Other Ambulatory Visit: Payer: Self-pay | Admitting: Family

## 2017-04-09 DIAGNOSIS — G2581 Restless legs syndrome: Secondary | ICD-10-CM

## 2017-04-17 ENCOUNTER — Ambulatory Visit (INDEPENDENT_AMBULATORY_CARE_PROVIDER_SITE_OTHER): Payer: Medicare Other | Admitting: *Deleted

## 2017-04-17 DIAGNOSIS — R55 Syncope and collapse: Secondary | ICD-10-CM | POA: Diagnosis not present

## 2017-04-20 NOTE — Progress Notes (Signed)
Carelink Summary Report / Loop Recorder 

## 2017-04-22 LAB — CUP PACEART REMOTE DEVICE CHECK
Date Time Interrogation Session: 20180907180558
MDC IDC PG IMPLANT DT: 20180709

## 2017-05-18 ENCOUNTER — Ambulatory Visit (INDEPENDENT_AMBULATORY_CARE_PROVIDER_SITE_OTHER): Payer: Medicare Other | Admitting: Cardiology

## 2017-05-18 ENCOUNTER — Encounter: Payer: Self-pay | Admitting: Cardiology

## 2017-05-18 ENCOUNTER — Ambulatory Visit (INDEPENDENT_AMBULATORY_CARE_PROVIDER_SITE_OTHER): Payer: Medicare Other | Admitting: *Deleted

## 2017-05-18 VITALS — BP 150/76 | HR 76 | Ht 65.5 in | Wt 155.6 lb

## 2017-05-18 DIAGNOSIS — I10 Essential (primary) hypertension: Secondary | ICD-10-CM

## 2017-05-18 DIAGNOSIS — R55 Syncope and collapse: Secondary | ICD-10-CM | POA: Diagnosis not present

## 2017-05-18 NOTE — Progress Notes (Signed)
PCP: Veryl Speak, FNP  Clinic Note: Chief Complaint  Patient presents with  . Follow-up    syncope; s/p ILR    HPI: Andrea Kelley is a 76 y.o. female with a PMH below who presents today for 3 month follow-up for syncope at the request of Veryl Speak, FNP.  Doralee Albino was originally seen by Theodore Demark, PA - 4 unusual episodes that sound like near syncope., However on June 15 she did have an episode of true syncope. -- She was referred for ILR and a 2-D echo  Recent Hospitalizations: none  Studies Personally Reviewed - (if available, images/films reviewed: From Epic Chart or Care Everywhere)  LINQ ILR July 9 -- to date, no arrhythmias or pauses noted.  2-D echo July 10: EF 65-70% with mild LVH. GR 1 DD. Sclerotic aortic valve but no stenosis.  Interval History: Interestingly, Andrea Kelley now has not had any further episodes of syncope or near-syncope. She otherwise is doing quite well. No sensation of rapid irregular heartbeats or palpitations.   No chest pain or shortness of breath with rest or exertion. No PND, orthopnea or edema. No lightheadedness, dizziness, weakness or syncope/near syncope. No TIA/amaurosis fugax symptoms. No melena, hematochezia, hematuria, or epstaxis. No claudication.  ROS: A comprehensive was performed. Review of Systems  Constitutional: Negative for chills.  HENT: Negative.   Respiratory: Negative for cough, shortness of breath and wheezing.   Gastrointestinal: Negative for heartburn.  Genitourinary: Negative for dysuria and frequency.  Musculoskeletal: Positive for joint pain. Negative for myalgias.  Skin: Negative.   Neurological: Positive for dizziness and headaches. Negative for weakness.  All other systems reviewed and are negative.   I have reviewed and (if needed) personally updated the patient's problem list, medications, allergies, past medical and surgical history, social and family history.   Past Medical History:   Diagnosis Date  . DDD (degenerative disc disease), lumbar   . Hyperlipidemia   . Osteopenia    [name]  . Syncope     Past Surgical History:  Procedure Laterality Date  . ABDOMINAL HYSTERECTOMY     For dysfunctional bleeding; no BSO  . BACK SURGERY  1970   ruptured disc ; Dr Roxan Hockey  . COLONOSCOPY  2001   Negative; done for rectal bleeding  . LOOP RECORDER INSERTION N/A 02/16/2017   Procedure: Loop Recorder Insertion;  Surgeon: Thurmon Fair, MD;  Location: [facility];  Service: Cardiovascular;  Laterality: N/A;    Current Meds  Medication Sig  . pravastatin (PRAVACHOL) 20 MG tablet TAKE 1 TABLET(20 MG) BY MOUTH DAILY (Patient taking differently: TAKE 1 TABLET(20 MG) BY MOUTH DAILY AT BEDTIME)  . ropinirole (REQUIP) 5 MG tablet TAKE 1 TABLET BY MOUTH EVERY NIGHT AT BEDTIME    Allergies  Allergen Reactions  . Sulfonamide Derivatives Rash    REACTION: rash as child    Social History   Social History  . Marital status: Married    Spouse name: N/A  . Number of children: N/A  . Years of education: N/A   Occupational History  . Retired Landscape architect, trained in Facilities manager therapy    Social History Main Topics  . Smoking status: Never Smoker  . Smokeless tobacco: Never Used  . Alcohol use Yes     Comment: Socially , 1-2 glasseswine / night  . Drug use: No  . Sexual activity: Not Asked   Other Topics Concern  . None   Social History Narrative  Lives w/ Husband in [place]    family history includes Breast cancer in her maternal aunt; Heart attack in her mother; Lung cancer in her father; Rheum arthritis in her father; Stroke in her mother.  Wt Readings from Last 3 Encounters:  05/18/17 155 lb 9.6 oz (70.6 kg)  02/16/17 (P) 155 lb (70.3 kg)  02/09/17 156 lb (70.8 kg)    PHYSICAL EXAM BP (!) 150/76   Pulse 76   Ht 5' 5.5" (1.664 m)   Wt 155 lb 9.6 oz (70.6 kg)   SpO2 97%   BMI 25.50 kg/m  Physical Exam  Constitutional: She is  oriented to person, place, and time. She appears well-developed and well-nourished. No distress.  Eyes: Pupils are equal, round, and reactive to light. EOM are normal. Right eye exhibits no discharge. Left eye exhibits no discharge.  Neck: No hepatojugular reflux and no JVD present. Carotid bruit is not present.  Cardiovascular: Normal rate, regular rhythm, normal heart sounds and intact distal pulses.   Extrasystoles are present. PMI is not displaced.  Exam reveals no gallop and no friction rub.   No murmur heard. Pulmonary/Chest: Effort normal and breath sounds normal. No respiratory distress. She has no wheezes. She has no rales.  Abdominal: Soft. Bowel sounds are normal. She exhibits no distension. There is no tenderness. There is no rebound.  Musculoskeletal: Normal range of motion. She exhibits no edema.  Neurological: She is alert and oriented to person, place, and time. No cranial nerve deficit.  Skin: Skin is warm and dry.  Psychiatric: She has a normal mood and affect. Her behavior is normal. Judgment and thought content normal.  Nursing note and vitals reviewed.    Adult ECG Report none  Other studies Reviewed: Additional studies/ records that were reviewed today include:  Recent Labs:  n/a   ASSESSMENT / PLAN: Problem List Items Addressed This Visit    Essential (primary) hypertension    Blood pressure is elevated today. Has not usually been an issue for her. Would prefer not to treat her over the summer as opposed to treat in 2/2 stable symptoms.      Syncope - Primary    No further episodes. No rapid irregular heartbeats or palpitations.  Plan: Continue monitoring Loop recorder.          Current medicines are reviewed at length with the patient today. (+/- concerns) n/a The following changes have been made: n/a  Patient Instructions  NO CHANGES WITH CURRENT MEDICATIONS     Your physician wants you to follow-up in 12 MONTHS WITH DR HARDING. You will  receive a reminder letter in the mail two months in advance. If you don't receive a letter, please call our office to schedule the follow-up appointment.    If you need a refill on your cardiac medications before your next appointment, please call your pharmacy.    Studies Ordered:   No orders of the defined types were placed in this encounter.     Bryan Lemma, M.D., M.S. Interventional Cardiologist   Pager # [phone] Phone # [phone] [address] [place], [place] [address]

## 2017-05-18 NOTE — Patient Instructions (Signed)
NO CHANGES WITH CURRENT MEDICATIONS   Your physician wants you to follow-up in 12 MONTHS WITH DR HARDING. You will receive a reminder letter in the mail two months in advance. If you don't receive a letter, please call our office to schedule the follow-up appointment.   If you need a refill on your cardiac medications before your next appointment, please call your pharmacy.  

## 2017-05-19 NOTE — Progress Notes (Signed)
Carelink Summary Report / Loop Recorder 

## 2017-05-20 ENCOUNTER — Encounter: Payer: Self-pay | Admitting: Cardiology

## 2017-05-20 DIAGNOSIS — I10 Essential (primary) hypertension: Secondary | ICD-10-CM | POA: Insufficient documentation

## 2017-05-20 NOTE — Assessment & Plan Note (Signed)
Blood pressure is elevated today. Has not usually been an issue for her. Would prefer not to treat her over the summer as opposed to treat in 2/2 stable symptoms.

## 2017-05-20 NOTE — Assessment & Plan Note (Signed)
No further episodes. No rapid irregular heartbeats or palpitations.  Plan: Continue monitoring Loop recorder.

## 2017-05-21 LAB — CUP PACEART REMOTE DEVICE CHECK
Implantable Pulse Generator Implant Date: 20180709
MDC IDC SESS DTM: 20181007183851

## 2017-06-16 ENCOUNTER — Ambulatory Visit (INDEPENDENT_AMBULATORY_CARE_PROVIDER_SITE_OTHER): Payer: Medicare Other | Admitting: *Deleted

## 2017-06-16 DIAGNOSIS — R55 Syncope and collapse: Secondary | ICD-10-CM

## 2017-06-17 NOTE — Progress Notes (Signed)
Carelink Summary Report / Loop Recorder 

## 2017-06-18 LAB — CUP PACEART REMOTE DEVICE CHECK
MDC IDC PG IMPLANT DT: 20180709
MDC IDC SESS DTM: 20181106193906

## 2017-07-07 ENCOUNTER — Other Ambulatory Visit: Payer: Self-pay | Admitting: *Deleted

## 2017-07-07 ENCOUNTER — Other Ambulatory Visit: Payer: Self-pay | Admitting: Family

## 2017-07-07 ENCOUNTER — Telehealth (HOSPITAL_COMMUNITY): Payer: Self-pay

## 2017-07-07 DIAGNOSIS — G2581 Restless legs syndrome: Secondary | ICD-10-CM

## 2017-07-07 NOTE — Telephone Encounter (Signed)
Patient request a refill for Requip / Marcos EkeGreg Calone, NP prescribed the medication.  He is no longer at the practice.  Per Epic, it does not seem as though the patient has had a visit with a PCP.  Patient has an appointment with Alphonse GuildAshleigh Shambley on 09-08-2017.  I am not sure if a short supply of medication can be sent until patient has her appointment. /   Copied from CRM [phone]#[id number]. Topic: Inquiry >> Jul 07, 2017  2:44 PM Anice PaganiniMunoz, Cruz I, NT wrote: Reason for CRM: Pt call for Rx Refill For Requip 5 mg she use [facility] @ [address] [place] [phone]

## 2017-07-08 ENCOUNTER — Other Ambulatory Visit: Payer: Self-pay

## 2017-07-08 DIAGNOSIS — E782 Mixed hyperlipidemia: Secondary | ICD-10-CM

## 2017-07-08 MED ORDER — PRAVASTATIN SODIUM 20 MG PO TABS: ORAL_TABLET | ORAL | 1 refills | Status: DC

## 2017-07-08 MED ORDER — ROPINIROLE HCL 5 MG PO TABS: 5.0000 mg | ORAL_TABLET | Freq: Every day | ORAL | 0 refills | Status: DC

## 2017-07-08 NOTE — Telephone Encounter (Signed)
Notified pt Per office policy ok to send enough med until appt.Rx sent to [facility]....Raechel Chute/lmb

## 2017-07-08 NOTE — Telephone Encounter (Signed)
See message below °

## 2017-07-16 ENCOUNTER — Ambulatory Visit (INDEPENDENT_AMBULATORY_CARE_PROVIDER_SITE_OTHER): Payer: Medicare Other | Admitting: *Deleted

## 2017-07-16 DIAGNOSIS — R55 Syncope and collapse: Secondary | ICD-10-CM | POA: Diagnosis not present

## 2017-07-17 NOTE — Progress Notes (Signed)
Carelink Summary Report / Loop Recorder 

## 2017-07-25 LAB — CUP PACEART REMOTE DEVICE CHECK
Date Time Interrogation Session: 20181206193704
MDC IDC PG IMPLANT DT: 20180709

## 2017-08-17 ENCOUNTER — Ambulatory Visit (INDEPENDENT_AMBULATORY_CARE_PROVIDER_SITE_OTHER): Payer: Medicare Other | Admitting: *Deleted

## 2017-08-17 DIAGNOSIS — R55 Syncope and collapse: Secondary | ICD-10-CM

## 2017-08-17 NOTE — Progress Notes (Signed)
Carelink Summary Report / Loop Recorder 

## 2017-08-31 LAB — CUP PACEART REMOTE DEVICE CHECK
MDC IDC PG IMPLANT DT: 20180709
MDC IDC SESS DTM: 20190105210949

## 2017-09-07 ENCOUNTER — Telehealth: Payer: Self-pay | Admitting: Cardiology

## 2017-09-07 NOTE — Telephone Encounter (Signed)
Patient called and stated that on August 14, 2017 she felt nauseated and like she was going to pass out. She did not use her symptom activator. She wanted to know if the Summary Report from 08-17-17 showed any episodes. I read the paceart note and informed the pt that she had 0 episodes. Pt verbalized understanding.

## 2017-09-08 ENCOUNTER — Ambulatory Visit: Payer: Medicare Other | Admitting: Nurse Practitioner

## 2017-09-10 ENCOUNTER — Other Ambulatory Visit (INDEPENDENT_AMBULATORY_CARE_PROVIDER_SITE_OTHER): Payer: Medicare Other

## 2017-09-10 ENCOUNTER — Ambulatory Visit: Payer: Medicare Other | Admitting: Nurse Practitioner

## 2017-09-10 ENCOUNTER — Encounter: Payer: Self-pay | Admitting: Nurse Practitioner

## 2017-09-10 VITALS — BP 154/90 | HR 77 | Temp 97.7°F | Resp 16 | Ht 65.5 in | Wt 157.8 lb

## 2017-09-10 DIAGNOSIS — G2581 Restless legs syndrome: Secondary | ICD-10-CM

## 2017-09-10 DIAGNOSIS — I10 Essential (primary) hypertension: Secondary | ICD-10-CM

## 2017-09-10 DIAGNOSIS — E782 Mixed hyperlipidemia: Secondary | ICD-10-CM | POA: Diagnosis not present

## 2017-09-10 DIAGNOSIS — Z23 Encounter for immunization: Secondary | ICD-10-CM | POA: Diagnosis not present

## 2017-09-10 LAB — FERRITIN: Ferritin: 91.3 ng/mL (ref 10.0–291.0)

## 2017-09-10 MED ORDER — PRAMIPEXOLE DIHYDROCHLORIDE 0.125 MG PO TABS: 0.1250 mg | ORAL_TABLET | Freq: Every day | ORAL | 1 refills | Status: DC

## 2017-09-10 MED ORDER — PRAVASTATIN SODIUM 20 MG PO TABS: ORAL_TABLET | ORAL | 1 refills | Status: DC

## 2017-09-10 NOTE — Assessment & Plan Note (Signed)
Stable, continue pravastatin We also discussed healthy diet and exercise in cholesterol reduction and improvement of overall health - See AVS for education provided to patient - pravastatin (PRAVACHOL) 20 MG tablet; TAKE 1 TABLET(20 MG) BY MOUTH DAILY  Dispense: 90 tablet; Refill: 1

## 2017-09-10 NOTE — Patient Instructions (Addendum)
Please head downstairs for lab work.  For your restless legs, we can try to add mirapex. You can take 1-3 tablets about 2-3 hours before bedtime. Start with 1 tablet on week one then increase each week by 1 tablet as needed for relief. Continue your requip.  For your blood pressure, Please try to check your blood pressure once daily or at least a few times a week, at the same time each day, and keep a log. Please return in about 3 weeks with your log, so we can see if we need to add any medication for your blood pressure.   It was nice to meet you. Thanks for letting me take care of you today :)   DASH Eating Plan DASH stands for "Dietary Approaches to Stop Hypertension." The DASH eating plan is a healthy eating plan that has been shown to reduce high blood pressure (hypertension). It may also reduce your risk for type 2 diabetes, heart disease, and stroke. The DASH eating plan may also help with weight loss. What are tips for following this plan? General guidelines  Avoid eating more than 2,300 mg (milligrams) of salt (sodium) a day. If you have hypertension, you may need to reduce your sodium intake to 1,500 mg a day.  Limit alcohol intake to no more than 1 drink a day for nonpregnant women and 2 drinks a day for men. One drink equals 12 oz of beer, 5 oz of wine, or 1 oz of hard liquor.  Work with your health care provider to maintain a healthy body weight or to lose weight. Ask what an ideal weight is for you.  Get at least 30 minutes of exercise that causes your heart to beat faster (aerobic exercise) most days of the week. Activities may include walking, swimming, or biking.  Work with your health care provider or diet and nutrition specialist (dietitian) to adjust your eating plan to your individual calorie needs. Reading food labels  Check food labels for the amount of sodium per serving. Choose foods with less than 5 percent of the Daily Value of sodium. Generally, foods with less  than 300 mg of sodium per serving fit into this eating plan.  To find whole grains, look for the word "whole" as the first word in the ingredient list. Shopping  Buy products labeled as "low-sodium" or "no salt added."  Buy fresh foods. Avoid canned foods and premade or frozen meals. Cooking  Avoid adding salt when cooking. Use salt-free seasonings or herbs instead of table salt or sea salt. Check with your health care provider or pharmacist before using salt substitutes.  Do not fry foods. Cook foods using healthy methods such as baking, boiling, grilling, and broiling instead.  Cook with heart-healthy oils, such as olive, canola, soybean, or sunflower oil. Meal planning   Eat a balanced diet that includes: ? 5 or more servings of fruits and vegetables each day. At each meal, try to fill half of your plate with fruits and vegetables. ? Up to 6-8 servings of whole grains each day. ? Less than 6 oz of lean meat, poultry, or fish each day. A 3-oz serving of meat is about the same size as a deck of cards. One egg equals 1 oz. ? 2 servings of low-fat dairy each day. ? A serving of nuts, seeds, or beans 5 times each week. ? Heart-healthy fats. Healthy fats called Omega-3 fatty acids are found in foods such as flaxseeds and coldwater fish, like sardines, salmon,  and mackerel.  Limit how much you eat of the following: ? Canned or prepackaged foods. ? Food that is high in trans fat, such as fried foods. ? Food that is high in saturated fat, such as fatty meat. ? Sweets, desserts, sugary drinks, and other foods with added sugar. ? Full-fat dairy products.  Do not salt foods before eating.  Try to eat at least 2 vegetarian meals each week.  Eat more home-cooked food and less restaurant, buffet, and fast food.  When eating at a restaurant, ask that your food be prepared with less salt or no salt, if possible. What foods are recommended? The items listed may not be a complete list. Talk  with your dietitian about what dietary choices are best for you. Grains Whole-grain or whole-wheat bread. Whole-grain or whole-wheat pasta. Brown rice. Orpah Cobb. Bulgur. Whole-grain and low-sodium cereals. Pita bread. Low-fat, low-sodium crackers. Whole-wheat flour tortillas. Vegetables Fresh or frozen vegetables (raw, steamed, roasted, or grilled). Low-sodium or reduced-sodium tomato and vegetable juice. Low-sodium or reduced-sodium tomato sauce and tomato paste. Low-sodium or reduced-sodium canned vegetables. Fruits All fresh, dried, or frozen fruit. Canned fruit in natural juice (without added sugar). Meat and other protein foods Skinless chicken or [place]. Ground chicken or [place]. Pork with fat trimmed off. Fish and seafood. Egg whites. Dried beans, peas, or lentils. Unsalted nuts, nut butters, and seeds. Unsalted canned beans. Lean cuts of beef with fat trimmed off. Low-sodium, lean deli meat. Dairy Low-fat (1%) or fat-free (skim) milk. Fat-free, low-fat, or reduced-fat cheeses. Nonfat, low-sodium ricotta or cottage cheese. Low-fat or nonfat yogurt. Low-fat, low-sodium cheese. Fats and oils Soft margarine without trans fats. Vegetable oil. Low-fat, reduced-fat, or light mayonnaise and salad dressings (reduced-sodium). Canola, safflower, olive, soybean, and sunflower oils. Avocado. Seasoning and other foods Herbs. Spices. Seasoning mixes without salt. Unsalted popcorn and pretzels. Fat-free sweets. What foods are not recommended? The items listed may not be a complete list. Talk with your dietitian about what dietary choices are best for you. Grains Baked goods made with fat, such as croissants, muffins, or some breads. Dry pasta or rice meal packs. Vegetables Creamed or fried vegetables. Vegetables in a cheese sauce. Regular canned vegetables (not low-sodium or reduced-sodium). Regular canned tomato sauce and paste (not low-sodium or reduced-sodium). Regular tomato and vegetable  juice (not low-sodium or reduced-sodium). Rosita Fire. Olives. Fruits Canned fruit in a light or heavy syrup. Fried fruit. Fruit in cream or butter sauce. Meat and other protein foods Fatty cuts of meat. Ribs. Fried meat. Tomasa Blase. Sausage. Bologna and other processed lunch meats. Salami. Fatback. Hotdogs. Bratwurst. Salted nuts and seeds. Canned beans with added salt. Canned or smoked fish. Whole eggs or egg yolks. Chicken or [place] with skin. Dairy Whole or 2% milk, cream, and half-and-half. Whole or full-fat cream cheese. Whole-fat or sweetened yogurt. Full-fat cheese. Nondairy creamers. Whipped toppings. Processed cheese and cheese spreads. Fats and oils Butter. Stick margarine. Lard. Shortening. Ghee. Bacon fat. Tropical oils, such as coconut, palm kernel, or palm oil. Seasoning and other foods Salted popcorn and pretzels. Onion salt, garlic salt, seasoned salt, table salt, and sea salt. Worcestershire sauce. Tartar sauce. Barbecue sauce. Teriyaki sauce. Soy sauce, including reduced-sodium. Steak sauce. Canned and packaged gravies. Fish sauce. Oyster sauce. Cocktail sauce. Horseradish that you find on the shelf. Ketchup. Mustard. Meat flavorings and tenderizers. Bouillon cubes. Hot sauce and Tabasco sauce. Premade or packaged marinades. Premade or packaged taco seasonings. Relishes. Regular salad dressings. Where to find more information:  National Heart,  Lung, and Blood Institute: PopSteam.is  American Heart Association: www.heart.org Summary  The DASH eating plan is a healthy eating plan that has been shown to reduce high blood pressure (hypertension). It may also reduce your risk for type 2 diabetes, heart disease, and stroke.  With the DASH eating plan, you should limit salt (sodium) intake to 2,300 mg a day. If you have hypertension, you may need to reduce your sodium intake to 1,500 mg a day.  When on the DASH eating plan, aim to eat more fresh fruits and vegetables, whole grains,  lean proteins, low-fat dairy, and heart-healthy fats.  Work with your health care provider or diet and nutrition specialist (dietitian) to adjust your eating plan to your individual calorie needs. This information is not intended to replace advice given to you by your health care provider. Make sure you discuss any questions you have with your health care provider. Document Released: 07/17/2011 Document Revised: 07/21/2016 Document Reviewed: 07/21/2016 Elsevier Interactive Patient Education  [employer].

## 2017-09-10 NOTE — Progress Notes (Signed)
Name: Andrea Kelley   MRN: [id number]    DOB: Sep 21, 1940   Date:09/10/2017       Progress Note  Subjective  Chief Complaint  Chief Complaint  Patient presents with  . Establish Care    medication refill, flu and pneumonia shot    HPI [name] is requesting a refill of her pravastatin today as well as a pneumonia shot. She would also like to talk about her restless legs. Her blood pressure is elevated today. She is ollowed by cardiology annually for syncope, with routine monthly heart monitoring via loop recorder.  Cholesterol- maintained on pravastatin 20 daily She ran out her pravastatin about 2 weeks ago. She says she was unable to request a refill until her appointment today. She plans to resume taking the medication daily and until 2 weeks ago she had been taking the medication daily without adverse medication effects including myalgias. Diet- she does not really watch her diet Exercise- she goes to [employer] three days a week, walks her dog daily  Lab Results  Component Value Date   CHOL 151 12/26/2016   HDL 46.20 12/26/2016   LDLCALC 80 12/26/2016   LDLDIRECT 90.0 10/14/2016   TRIG 127.0 12/26/2016   CHOLHDL 3 12/26/2016   Restless legs- maintained on requip 5 daily. The requip does provide some relief of her symptoms but she continues to experience symptoms a few nights a week, depending on what time she takes her medication. She says she has trouble sitting down at night, she has to keep walking around house before bed. She notices twitching and jumping in her legs on some nights. She denies weakness, falls, pain.  Hypertension -She is not maintained on any medications for blood pressure reduction She says she feels well and denies headaches, vision changes, chest pain, shortness of breath, edema.  She feels that her blood pressure might be high because she was rushing to get to her visit today  BP Readings from Last 3 Encounters:  09/10/17 (!) 154/90   05/18/17 (!) 150/76  02/16/17 (!) 141/63    Patient Active Problem List   Diagnosis Date Noted  . Essential (primary) hypertension 05/20/2017  . Syncope 02/09/2017  . DDD (degenerative disc disease), lumbar 01/16/2011  . RESTLESS LEG SYNDROME 06/07/2009  . Hyperglycemia 06/07/2009  . Hyperlipidemia 07/19/2007    Past Surgical History:  Procedure Laterality Date  . ABDOMINAL HYSTERECTOMY     For dysfunctional bleeding; no BSO  . BACK SURGERY  1970   ruptured disc ; Dr Roxan Hockeyobinson  . COLONOSCOPY  2001   Negative; done for rectal bleeding  . LOOP RECORDER INSERTION N/A 02/16/2017   Procedure: Loop Recorder Insertion;  Surgeon: Thurmon Fairroitoru, Mihai, MD;  Location: [facility];  Service: Cardiovascular;  Laterality: N/A;    Family History  Problem Relation Age of Onset  . Lung cancer Father        smoker  . Rheum arthritis Father   . Heart attack Mother        3170s  . Stroke Mother        in 3980s  . Breast cancer Maternal Aunt   . Diabetes Neg Hx     Social History   Socioeconomic History  . Marital status: Married    Spouse name: Not on file  . Number of children: Not on file  . Years of education: Not on file  . Highest education level: Not on file  Social Needs  . Financial resource strain:  Not on file  . Food insecurity - worry: Not on file  . Food insecurity - inability: Not on file  . Transportation needs - medical: Not on file  . Transportation needs - non-medical: Not on file  Occupational History  . Occupation: Retired Landscape architect, trained in Facilities manager therapy  Tobacco Use  . Smoking status: Never Smoker  . Smokeless tobacco: Never Used  Substance and Sexual Activity  . Alcohol use: Yes    Comment: Socially , 1-2 glasseswine / night  . Drug use: No  . Sexual activity: Not on file  Other Topics Concern  . Not on file  Social History Narrative   Lives w/ Husband in [place]     Current Outpatient Medications:  .  ropinirole (REQUIP) 5 MG  tablet, Take 1 tablet (5 mg total) by mouth at bedtime. Must keep schedule appt w/new provider for future refills, Disp: 90 tablet, Rfl: 0 .  pravastatin (PRAVACHOL) 20 MG tablet, TAKE 1 TABLET(20 MG) BY MOUTH DAILY, Disp: 90 tablet, Rfl: 1  Allergies  Allergen Reactions  . Sulfonamide Derivatives Rash    REACTION: rash as child     ROS See HPI  Objective  Vitals:   09/10/17 0952  BP: (!) 154/90  Pulse: 77  Resp: 16  Temp: 97.7 F (36.5 C)  TempSrc: Oral  SpO2: 98%  Weight: 157 lb 12.8 oz (71.6 kg)  Height: 5' 5.5" (1.664 m)    Body mass index is 25.86 kg/m.  Physical Exam Vital signs reviewed Constitutional: Patient appears well-developed and well-nourished. No distress.  HENT: Head: Normocephalic and atraumatic. Nose: Nose normal. Mouth/Throat: Oropharynx is clear and moist. No oropharyngeal exudate.  Eyes: Conjunctivaeare normal. Pupils are equal, round, and reactive to light. No scleral icterus.  Neck: Normal range of motion. Neck supple. No JVD present.  Cardiovascular: Normal rate, regular rhythm and normal heart sounds.  No murmur heard. No BLE edema. Distal pulses intact. Pulmonary/Chest: Effort normal and breath sounds normal. No respiratory distress. Abdominal: Soft. No distension.  Musculoskeletal: Normal range of motion, no joint effusions. No gross deformities Neurological: She is alert and oriented to person, place, and time. Coordination, balance, strength, speech and gait are normal.  Skin: Skin is warm and dry. No rash noted. No erythema.  Psychiatric: Patient has a normal mood and affect. Behavior is normal. Judgment and thought content normal.   Assessment & Plan RTC in about 3 weeks for follow up of mirapex, elevated blood pressure  -Reviewed Health Maintenance: immunizations given today, HM up to date Need for influenza vaccination - Flu vaccine HIGH DOSE PF (Fluzone High dose)  Need for vaccination with 13-polyvalent pneumococcal conjugate  vaccine - Pneumococcal conjugate vaccine 13-valent IM

## 2017-09-10 NOTE — Assessment & Plan Note (Signed)
She will continue requip at current dosage and we will add mirapex-1 tablet each week for up to 3 tablets a night We discussed dosing, timing of administration, and side effects of requip and mirapex including drowsiness. Will also check iron level today, although normal in the past. We discussed daily exercise and leg stretching in the reduction of restless leg symptoms - Ferritin; Future - pramipexole (MIRAPEX) 0.125 MG tablet; Take 1 tablet (0.125 mg total) by mouth daily. Take 1-3 tablets 2-3 hours before bedtime.  Dispense: 60 tablet; Refill: 1

## 2017-09-10 NOTE — Assessment & Plan Note (Addendum)
She has several recorded elevated BP readings in cinic now This seems to be a fairly new issue for her, according to cardiology note on 10/8 We did discuss initiation of BP medication if we continue to see high readings She would like to keep log with home recordings for a few weeks first We also discussed healthy diet and exercise in the role of blood pressure reduction and improvement in overall health - See AVS for education provided to patient She will return in about 3 weeks for F/U with her home log and BP cuff

## 2017-09-14 ENCOUNTER — Ambulatory Visit (INDEPENDENT_AMBULATORY_CARE_PROVIDER_SITE_OTHER): Payer: Medicare Other | Admitting: *Deleted

## 2017-09-14 DIAGNOSIS — R55 Syncope and collapse: Secondary | ICD-10-CM | POA: Diagnosis not present

## 2017-09-15 NOTE — Progress Notes (Signed)
Carelink Summary Report / Loop Recorder 

## 2017-10-06 ENCOUNTER — Other Ambulatory Visit: Payer: Self-pay | Admitting: Nurse Practitioner

## 2017-10-06 DIAGNOSIS — G2581 Restless legs syndrome: Secondary | ICD-10-CM

## 2017-10-06 LAB — CUP PACEART REMOTE DEVICE CHECK
Date Time Interrogation Session: 20190204213714
Implantable Pulse Generator Implant Date: 20180709

## 2017-10-06 MED ORDER — ROPINIROLE HCL 5 MG PO TABS: 5.0000 mg | ORAL_TABLET | Freq: Every day | ORAL | 0 refills | Status: DC

## 2017-10-07 ENCOUNTER — Other Ambulatory Visit: Payer: Self-pay

## 2017-10-07 DIAGNOSIS — G2581 Restless legs syndrome: Secondary | ICD-10-CM

## 2017-10-07 MED ORDER — PRAMIPEXOLE DIHYDROCHLORIDE 0.125 MG PO TABS: 0.1250 mg | ORAL_TABLET | Freq: Every day | ORAL | 0 refills | Status: DC

## 2017-10-12 ENCOUNTER — Encounter: Payer: Self-pay | Admitting: Nurse Practitioner

## 2017-10-12 ENCOUNTER — Ambulatory Visit: Payer: Medicare Other | Admitting: Nurse Practitioner

## 2017-10-12 VITALS — BP 160/80 | HR 78 | Temp 97.8°F | Resp 16 | Ht 65.5 in | Wt 158.8 lb

## 2017-10-12 DIAGNOSIS — G2581 Restless legs syndrome: Secondary | ICD-10-CM | POA: Diagnosis not present

## 2017-10-12 DIAGNOSIS — I10 Essential (primary) hypertension: Secondary | ICD-10-CM | POA: Diagnosis not present

## 2017-10-12 MED ORDER — LOSARTAN POTASSIUM 50 MG PO TABS: 50.0000 mg | ORAL_TABLET | Freq: Every day | ORAL | 1 refills | Status: DC

## 2017-10-12 NOTE — Progress Notes (Signed)
Name: Andrea Kelley   MRN: [id number]    DOB: 04-17-1941   Date:10/12/2017       Progress Note  Subjective  Chief Complaint  Chief Complaint  Patient presents with  . Follow-up    Restless legs has not improved, states its about the same with or without the medications that have been prescribed    HPI She is following up for two chronic problems- restless legs and HTN today.  Hypertension -not maintained on medications for high blood pressure. She has had multiple elevated readings in the clinical setting now. We discussed this at her last OV, and she wanted to monitor at home for a few weeks prior to initiation of pharmacological therapy. Reports home readings 120-150/60-90's at home She has noticed her vision seems blurry when her blood pressure is elevated. Denies headaches, confusion, weakness, chest pain, shortness of breath, edema. Her husband suddenly passed away last fall and she noted the blood pressure increase afterwards, she has been experiencing a great deal of stress related to his death  BP Readings from Last 3 Encounters:  10/12/17 (!) 160/80  09/10/17 (!) 154/90  05/18/17 (!) 150/76   Restless legs syndrome- she has been maintained on requip 5 daily for restless legs some time. At her last visit she reported continued symptoms of restless legs- leg twitching, feeling like she can not sit still at night. We added mirapex 0.125 with instructions to titrate up to 3 tablets maximum 2-3 hours prior to bedtime. We also discussed daily leg exercises and leg stretching for restless legs, which she admits she has not really been doing. She says she has continued the requip and titrated up to 3 tablets of mirapex at night before bed with no improvement in her restless legs. She continues to have twitching and cant sit still at night. She went to see a movie last week and had to stand up the whole time. She denies any weakness, falls, pain. Ferritin level was normal on  1/31.   Patient Active Problem List   Diagnosis Date Noted  . Essential (primary) hypertension 05/20/2017  . Syncope 02/09/2017  . DDD (degenerative disc disease), lumbar 01/16/2011  . RESTLESS LEG SYNDROME 06/07/2009  . Hyperglycemia 06/07/2009  . Hyperlipidemia 07/19/2007    Past Surgical History:  Procedure Laterality Date  . ABDOMINAL HYSTERECTOMY     For dysfunctional bleeding; no BSO  . BACK SURGERY  1970   ruptured disc ; Dr Roxan Hockey  . COLONOSCOPY  2001   Negative; done for rectal bleeding  . LOOP RECORDER INSERTION N/A 02/16/2017   Procedure: Loop Recorder Insertion;  Surgeon: Thurmon Fair, MD;  Location: [facility];  Service: Cardiovascular;  Laterality: N/A;    Family History  Problem Relation Age of Onset  . Lung cancer Father        smoker  . Rheum arthritis Father   . Heart attack Mother        21s  . Stroke Mother        in 50s  . Breast cancer Maternal Aunt   . Diabetes Neg Hx     Social History   Socioeconomic History  . Marital status: Married    Spouse name: Not on file  . Number of children: Not on file  . Years of education: Not on file  . Highest education level: Not on file  Social Needs  . Financial resource strain: Not on file  . Food insecurity - worry: Not on file  .  Food insecurity - inability: Not on file  . Transportation needs - medical: Not on file  . Transportation needs - non-medical: Not on file  Occupational History  . Occupation: Retired Landscape architect, trained in Facilities manager therapy  Tobacco Use  . Smoking status: Never Smoker  . Smokeless tobacco: Never Used  Substance and Sexual Activity  . Alcohol use: Yes    Comment: Socially , 1-2 glasseswine / night  . Drug use: No  . Sexual activity: Not on file  Other Topics Concern  . Not on file  Social History Narrative   Lives w/ Husband in [place]     Current Outpatient Medications:  .  pravastatin (PRAVACHOL) 20 MG tablet, TAKE 1 TABLET(20 MG) BY MOUTH  DAILY, Disp: 90 tablet, Rfl: 1 .  ropinirole (REQUIP) 5 MG tablet, Take 1 tablet (5 mg total) by mouth at bedtime. Must keep schedule appt w/new provider for future refills, Disp: 90 tablet, Rfl: 0 .  losartan (COZAAR) 50 MG tablet, Take 1 tablet (50 mg total) by mouth daily., Disp: 30 tablet, Rfl: 1 .  pramipexole (MIRAPEX) 0.125 MG tablet, Take 1 tablet (0.125 mg total) by mouth daily. Take 1-3 tablets 2-3 hours before bedtime. (Patient not taking: Reported on 10/12/2017), Disp: 90 tablet, Rfl: 0  Allergies  Allergen Reactions  . Sulfonamide Derivatives Rash    REACTION: rash as child     ROS See HPI  Objective  Vitals:   10/12/17 1023  BP: (!) 160/80  Pulse: 78  Resp: 16  Temp: 97.8 F (36.6 C)  TempSrc: Oral  SpO2: 98%  Weight: 158 lb 12.8 oz (72 kg)  Height: 5' 5.5" (1.664 m)    Body mass index is 26.02 kg/m.  Physical Exam Vital signs reviewed Constitutional: Patient appears well-developed and well-nourished. No distress.  HENT: Head: Normocephalic and atraumatic. Nose: Nose normal. Mouth/Throat: Oropharynx is clear and moist. Eyes: Conjunctiva eare normal. No scleral icterus.  Neck: Normal range of motion. Neck supple.  Cardiovascular: Normal rate, regular rhythm and normal heart sounds.  No BLE edema. Distal pulses intact. Pulmonary/Chest: Effort normal and breath sounds normal. No respiratory distress. Abdominal: Soft. No distension.  Musculoskeletal: Normal range of motion, no joint effusions. No gross deformities Neurological: She is alert and oriented to person, place, and time. Coordination, balance, strength, speech and gait are normal.  Skin: Skin is warm and dry. No rash noted. No erythema.  Psychiatric: Patient has a normal mood and affect. Behavior is normal. Judgment and thought content normal.  PHQ2/9: Depression screen [facility] 2/9 09/10/2017 11/27/2015 05/12/2015 10/24/2013  Decreased Interest 0 0 0 0  Down, Depressed, Hopeless 0 0 0 0  PHQ - 2 Score 0 0  0 0   Fall Risk: Fall Risk  09/10/2017 10/24/2013  Falls in the past year? Yes No  Number falls in past yr: 1 -  Injury with Fall? No -     Assessment & Plan RTC in 2-3 weeks for F/U of losartan initiation, BMET  -Reviewed Health Maintenance: up to date  1. Essential (primary) hypertension Readings remain elevated. We discussed initiating medication for reduction of blood pressure and she is agreeable. Will start losartan 50 once daily- dosing and side effects discussed We also Discussed the role of healthy diet and reduction of sodium in the management of hypertension-See AVS for education provided to patient She will continue to monitor BP readings at home and follow up in 2-3 weeks-her home BP monitor was calibrated in office today for  accurracy - losartan (COZAAR) 50 MG tablet; Take 1 tablet (50 mg total) by mouth daily.  Dispense: 30 tablet; Refill: 1  2. RESTLESS LEG SYNDROME We discussed referral to neurology for further evaluation and management of restless legs symptoms. She actually wants to see a specialist in [place] that her daughter has been seeing for restless legs-she will call them to see if she needs referral and let me know Will continue requip for now

## 2017-10-12 NOTE — Patient Instructions (Signed)
Please start losartan 50mg  once daily for your blood pressure.  Please let me know if you are getting readings over 140/90, under 110/70.  Please return in about 2-3 weeks so we can see how you are doing on the new medication.  It was good to see you. Thanks for letting me take care of you today :)   Mediterranean Diet A Mediterranean diet refers to food and lifestyle choices that are based on the traditions of countries located on the [employer]. This way of eating has been shown to help prevent certain conditions and improve outcomes for people who have chronic diseases, like kidney disease and heart disease. What are tips for following this plan? Lifestyle  Cook and eat meals together with your family, when possible.  Drink enough fluid to keep your urine clear or pale yellow.  Be physically active every day. This includes: ? Aerobic exercise like running or swimming. ? Leisure activities like gardening, walking, or housework.  Get 7-8 hours of sleep each night.  If recommended by your health care provider, drink red wine in moderation. This means 1 glass a day for nonpregnant women and 2 glasses a day for men. A glass of wine equals 5 oz (150 mL). Reading food labels  Check the serving size of packaged foods. For foods such as rice and pasta, the serving size refers to the amount of cooked product, not dry.  Check the total fat in packaged foods. Avoid foods that have saturated fat or trans fats.  Check the ingredients list for added sugars, such as corn syrup. Shopping  At the grocery store, buy most of your food from the areas near the walls of the store. This includes: ? Fresh fruits and vegetables (produce). ? Grains, beans, nuts, and seeds. Some of these may be available in unpackaged forms or large amounts (in bulk). ? Fresh seafood. ? Poultry and eggs. ? Low-fat dairy products.  Buy whole ingredients instead of prepackaged foods.  Buy fresh fruits and  vegetables in-season from local farmers markets.  Buy frozen fruits and vegetables in resealable bags.  If you do not have access to quality fresh seafood, buy precooked frozen shrimp or canned fish, such as tuna, salmon, or sardines.  Buy small amounts of raw or cooked vegetables, salads, or olives from the deli or salad bar at your store.  Stock your pantry so you always have certain foods on hand, such as olive oil, canned tuna, canned tomatoes, rice, pasta, and beans. Cooking  Cook foods with extra-virgin olive oil instead of using butter or other vegetable oils.  Have meat as a side dish, and have vegetables or grains as your main dish. This means having meat in small portions or adding small amounts of meat to foods like pasta or stew.  Use beans or vegetables instead of meat in common dishes like chili or lasagna.  Experiment with different cooking methods. Try roasting or broiling vegetables instead of steaming or sauteing them.  Add frozen vegetables to soups, stews, pasta, or rice.  Add nuts or seeds for added healthy fat at each meal. You can add these to yogurt, salads, or vegetable dishes.  Marinate fish or vegetables using olive oil, lemon juice, garlic, and fresh herbs. Meal planning  Plan to eat 1 vegetarian meal one day each week. Try to work up to 2 vegetarian meals, if possible.  Eat seafood 2 or more times a week.  Have healthy snacks readily available, such as: ? Vegetable  sticks with hummus. ? [place] yogurt. ? Fruit and nut trail mix.  Eat balanced meals throughout the week. This includes: ? Fruit: 2-3 servings a day ? Vegetables: 4-5 servings a day ? Low-fat dairy: 2 servings a day ? Fish, poultry, or lean meat: 1 serving a day ? Beans and legumes: 2 or more servings a week ? Nuts and seeds: 1-2 servings a day ? Whole grains: 6-8 servings a day ? Extra-virgin olive oil: 3-4 servings a day  Limit red meat and sweets to only a few servings a  month What are my food choices?  Mediterranean diet ? Recommended ? Grains: Whole-grain pasta. Brown rice. Bulgar wheat. Polenta. Couscous. Whole-wheat bread. [place] Cobbatmeal. Quinoa. ? Vegetables: Artichokes. Beets. Broccoli. Cabbage. Carrots. Eggplant. Green beans. Chard. Kale. Spinach. Onions. Leeks. Peas. Squash. Tomatoes. Peppers. Radishes. ? Fruits: Apples. Apricots. Avocado. Berries. Bananas. Cherries. Dates. Figs. Grapes. Lemons. Melon. Oranges. Peaches. Plums. Pomegranate. ? Meats and other protein foods: Beans. Almonds. Sunflower seeds. Pine nuts. Peanuts. Cod. Salmon. Scallops. Shrimp. Tuna. Tilapia. Clams. Oysters. Eggs. ? Dairy: Low-fat milk. Cheese. Greek yogurt. ? Beverages: Water. Red wine. Herbal tea. ? Fats and oils: Extra virgin olive oil. Avocado oil. Grape seed oil. ? Sweets and desserts: [place] yogurt with honey. Baked apples. Poached pears. Trail mix. ? Seasoning and other foods: Basil. Cilantro. Coriander. Cumin. Mint. Parsley. Sage. Rosemary. Tarragon. Garlic. Oregano. Thyme. Pepper. Balsalmic vinegar. Tahini. Hummus. Tomato sauce. Olives. Mushrooms. ? Limit these ? Grains: Prepackaged pasta or rice dishes. Prepackaged cereal with added sugar. ? Vegetables: Deep fried potatoes (french fries). ? Fruits: Fruit canned in syrup. ? Meats and other protein foods: Beef. Pork. Lamb. Poultry with skin. Hot dogs. Tomasa BlaseBacon. ? Dairy: Ice cream. Sour cream. Whole milk. ? Beverages: Juice. Sugar-sweetened soft drinks. Beer. Liquor and spirits. ? Fats and oils: Butter. Canola oil. Vegetable oil. Beef fat (tallow). Lard. ? Sweets and desserts: Cookies. Cakes. Pies. Candy. ? Seasoning and other foods: Mayonnaise. Premade sauces and marinades. ? The items listed may not be a complete list. Talk with your dietitian about what dietary choices are right for you. Summary  The Mediterranean diet includes both food and lifestyle choices.  Eat a variety of fresh fruits and vegetables, beans, nuts,  seeds, and whole grains.  Limit the amount of red meat and sweets that you eat.  Talk with your health care provider about whether it is safe for you to drink red wine in moderation. This means 1 glass a day for nonpregnant women and 2 glasses a day for men. A glass of wine equals 5 oz (150 mL). This information is not intended to replace advice given to you by your health care provider. Make sure you discuss any questions you have with your health care provider. Document Released: 03/20/2016 Document Revised: 04/22/2016 Document Reviewed: 03/20/2016 Elsevier Interactive Patient Education  [employer]2018 [employer]vier Inc.

## 2017-10-19 ENCOUNTER — Ambulatory Visit (INDEPENDENT_AMBULATORY_CARE_PROVIDER_SITE_OTHER): Payer: Medicare Other | Admitting: *Deleted

## 2017-10-19 DIAGNOSIS — R55 Syncope and collapse: Secondary | ICD-10-CM | POA: Diagnosis not present

## 2017-10-19 NOTE — Progress Notes (Signed)
Carelink Summary Report / Loop Recorder 

## 2017-10-28 ENCOUNTER — Telehealth: Payer: Self-pay | Admitting: Cardiology

## 2017-10-28 NOTE — Telephone Encounter (Signed)
New Message  Pt returning call about device transmission   1. Has your device fired? no  2. Is you device beeping? no  3. Are you experiencing draining or swelling at device site? no  4. Are you calling to see if we received your device transmission? yes  5. Have you passed out? no    Please route to Device Clinic Pool

## 2017-10-28 NOTE — Telephone Encounter (Signed)
LMOVM requesting that pt send manual transmission b/c home monitor has not updated in at least 14 days.    

## 2017-10-28 NOTE — Telephone Encounter (Signed)
Transmission not received. I assisted Andrea Kelley with a manual transmission- received. No new events.

## 2017-11-06 ENCOUNTER — Encounter: Payer: Self-pay | Admitting: Nurse Practitioner

## 2017-11-06 ENCOUNTER — Ambulatory Visit: Payer: Medicare Other | Admitting: Nurse Practitioner

## 2017-11-06 ENCOUNTER — Other Ambulatory Visit (INDEPENDENT_AMBULATORY_CARE_PROVIDER_SITE_OTHER): Payer: Medicare Other

## 2017-11-06 VITALS — BP 140/82 | HR 78 | Temp 98.0°F | Resp 16 | Ht 65.5 in | Wt 151.0 lb

## 2017-11-06 DIAGNOSIS — I10 Essential (primary) hypertension: Secondary | ICD-10-CM

## 2017-11-06 LAB — BASIC METABOLIC PANEL
BUN: 18 mg/dL (ref 6–23)
CALCIUM: 9.1 mg/dL (ref 8.4–10.5)
CO2: 26 meq/L (ref 19–32)
Chloride: 107 mEq/L (ref 96–112)
Creatinine, Ser: 0.8 mg/dL (ref 0.40–1.20)
GFR: 73.79 mL/min (ref >60.00)
GLUCOSE: 113 mg/dL — AB (ref 70–99)
POTASSIUM: 4.7 meq/L (ref 3.5–5.1)
Sodium: 139 mEq/L (ref 135–145)

## 2017-11-06 NOTE — Patient Instructions (Addendum)
Please head downstairs for lab work/x-rays. If any of your test results are critically abnormal, you will be contacted right away. Your results may be released to your MyChart for viewing before I am able to provide you with my response. I will contact you within a week about your test results and any recommendations for abnormalities.  Please try to check your blood pressure once daily or at least a few times a week, at the same time each day, and keep a log. Our goal is BP under 140/90. Please return in about 3 months for follow up of your blood pressure. We can do your annual physical if you are feeling well that day.  It was good to see you. Thanks for letting me take care of you today :)

## 2017-11-06 NOTE — Assessment & Plan Note (Signed)
Stable, continue losartan Continue to monitor BP at home F/U in 3 months, sooner for readings > 140/90 - Basic metabolic panel; Future

## 2017-11-06 NOTE — Progress Notes (Signed)
Name: Andrea Kelley   MRN: [id number]    DOB: 01-29-1941   Date:11/06/2017       Progress Note  Subjective  Chief Complaint  Chief Complaint  Patient presents with  . Follow-up    blood pressure    HPI  Hypertension -we started losartan 50 once daily at last OV due to repeatedly high blood pressures- had not been on blood pressure medications prior Reports she has been taking the losartan daily as instructed without noted adverse reactions Reports readings at home 120s-140s/60s-70 since starting losartan Denies headaches, vision changes, chest pain, shortness of breath, edema. Feels well today  BP Readings from Last 3 Encounters:  11/06/17 140/82  10/12/17 (!) 160/80  09/10/17 (!) 154/90     Patient Active Problem List   Diagnosis Date Noted  . Essential (primary) hypertension 05/20/2017  . Syncope 02/09/2017  . DDD (degenerative disc disease), lumbar 01/16/2011  . RESTLESS LEG SYNDROME 06/07/2009  . Hyperglycemia 06/07/2009  . Hyperlipidemia 07/19/2007    Past Surgical History:  Procedure Laterality Date  . ABDOMINAL HYSTERECTOMY     For dysfunctional bleeding; no BSO  . BACK SURGERY  1970   ruptured disc ; Dr Roxan Hockeyobinson  . COLONOSCOPY  2001   Negative; done for rectal bleeding  . LOOP RECORDER INSERTION N/A 02/16/2017   Procedure: Loop Recorder Insertion;  Surgeon: Thurmon Fairroitoru, Mihai, MD;  Location: [facility];  Service: Cardiovascular;  Laterality: N/A;    Family History  Problem Relation Age of Onset  . Lung cancer Father        smoker  . Rheum arthritis Father   . Heart attack Mother        3570s  . Stroke Mother        in 3680s  . Breast cancer Maternal Aunt   . Diabetes Neg Hx     Social History   Socioeconomic History  . Marital status: Married    Spouse name: Not on file  . Number of children: Not on file  . Years of education: Not on file  . Highest education level: Not on file  Occupational History  . Occupation: Retired Education officer, [employer], trained in Facilities managerspeech therapy  Social Needs  . Financial resource strain: Not on file  . Food insecurity:    Worry: Not on file    Inability: Not on file  . Transportation needs:    Medical: Not on file    Non-medical: Not on file  Tobacco Use  . Smoking status: Never Smoker  . Smokeless tobacco: Never Used  Substance and Sexual Activity  . Alcohol use: Yes    Comment: Socially , 1-2 glasseswine / night  . Drug use: No  . Sexual activity: Not on file  Lifestyle  . Physical activity:    Days per week: Not on file    Minutes per session: Not on file  . Stress: Not on file  Relationships  . Social connections:    Talks on phone: Not on file    Gets together: Not on file    Attends religious service: Not on file    Active member of club or organization: Not on file    Attends meetings of clubs or organizations: Not on file    Relationship status: Not on file  . Intimate partner violence:    Fear of current or ex partner: Not on file    Emotionally abused: Not on file    Physically abused: Not on file  Forced sexual activity: Not on file  Other Topics Concern  . Not on file  Social History Narrative   Lives w/ Husband in [place]     Current Outpatient Medications:  .  losartan (COZAAR) 50 MG tablet, Take 1 tablet (50 mg total) by mouth daily., Disp: 30 tablet, Rfl: 1 .  pravastatin (PRAVACHOL) 20 MG tablet, TAKE 1 TABLET(20 MG) BY MOUTH DAILY, Disp: 90 tablet, Rfl: 1 .  ropinirole (REQUIP) 5 MG tablet, Take 1 tablet (5 mg total) by mouth at bedtime. Must keep schedule appt w/new provider for future refills, Disp: 90 tablet, Rfl: 0  Allergies  Allergen Reactions  . Sulfonamide Derivatives Rash    REACTION: rash as child     ROS See HPI  Objective  Vitals:   11/06/17 1118  BP: 140/82  Pulse: 78  Resp: 16  Temp: 98 F (36.7 C)  TempSrc: Oral  SpO2: 95%  Weight: 151 lb (68.5 kg)  Height: 5' 5.5" (1.664 m)   Body mass index is 24.75  kg/m.  Physical Exam Vital signs reviewed Constitutional: Patient appears well-developed and well-nourished. No distress.  HENT: Head: Normocephalic and atraumatic. Nose: Nose normal. Mouth/Throat: Oropharynx is clear and moist. Eyes: Conjunctiva eare normal. Pupils equal, round and reactive to light. No scleral icterus.  Neck: Normal range of motion. Neck supple.  Cardiovascular: Normal rate, regular rhythm and normal heart sounds. No BLE edema. Distal pulses intact. Pulmonary/Chest: Effort normal and breath sounds normal. No respiratory distress.s Neurological:She is alert and oriented to person, place, and time. Coordination, balance, strength, speech and gait are normal.  Skin: Skin is warm and dry. No rash noted. No erythema.  Psychiatric: Patient has a normal mood and affect.Behavior is normal. Judgment and thought content normal.  Assessment & Plan RTC in about 3 months for F/U: HTN, CPE

## 2017-11-18 ENCOUNTER — Telehealth: Payer: Self-pay | Admitting: Cardiology

## 2017-11-18 NOTE — Telephone Encounter (Signed)
Spoke w/ pt and requested that she send a manual transmission b/c her home monitor has not updated in at least 14 days.   

## 2017-11-19 ENCOUNTER — Ambulatory Visit (INDEPENDENT_AMBULATORY_CARE_PROVIDER_SITE_OTHER): Payer: Medicare Other | Admitting: *Deleted

## 2017-11-19 DIAGNOSIS — R55 Syncope and collapse: Secondary | ICD-10-CM | POA: Diagnosis not present

## 2017-11-23 NOTE — Progress Notes (Signed)
Carelink Summary Report / Loop Recorder 

## 2017-11-26 LAB — CUP PACEART REMOTE DEVICE CHECK
MDC IDC PG IMPLANT DT: 20180709
MDC IDC SESS DTM: 20190309224012

## 2017-12-11 ENCOUNTER — Encounter: Payer: Self-pay | Admitting: Nurse Practitioner

## 2017-12-14 ENCOUNTER — Other Ambulatory Visit: Payer: Self-pay

## 2017-12-14 DIAGNOSIS — I10 Essential (primary) hypertension: Secondary | ICD-10-CM

## 2017-12-14 DIAGNOSIS — H5203 Hypermetropia, bilateral: Secondary | ICD-10-CM | POA: Diagnosis not present

## 2017-12-14 MED ORDER — LOSARTAN POTASSIUM 50 MG PO TABS: 50.0000 mg | ORAL_TABLET | Freq: Every day | ORAL | 1 refills | Status: DC

## 2017-12-18 ENCOUNTER — Telehealth: Payer: Self-pay | Admitting: Cardiology

## 2017-12-18 NOTE — Telephone Encounter (Signed)
LVM for call back

## 2017-12-18 NOTE — Telephone Encounter (Signed)
Spoke to patient about scheduled remote. Patient states that she will be traveling on 5/14 and didn't know if the summary report could be r/s'd to 5/15. I told patient to plug the monitor up on the 15th when she gets to her destination and it should come through. Patient verbalized understanding.

## 2017-12-18 NOTE — Telephone Encounter (Signed)
NEW MESSAGE   Patient calling to reschedule remote check  1. Has your device fired? NO  2. Is you device beeping? NO  3. Are you experiencing draining or swelling at device site? NO  4. Are you calling to see if we received your device transmission? NO  5. Have you passed out? NO    Please route to Device Clinic Pool

## 2017-12-19 ENCOUNTER — Encounter (INDEPENDENT_AMBULATORY_CARE_PROVIDER_SITE_OTHER): Payer: Medicare Other | Admitting: Ophthalmology

## 2017-12-19 DIAGNOSIS — I10 Essential (primary) hypertension: Secondary | ICD-10-CM

## 2017-12-19 DIAGNOSIS — H353132 Nonexudative age-related macular degeneration, bilateral, intermediate dry stage: Secondary | ICD-10-CM

## 2017-12-19 DIAGNOSIS — H43813 Vitreous degeneration, bilateral: Secondary | ICD-10-CM

## 2017-12-19 DIAGNOSIS — H35033 Hypertensive retinopathy, bilateral: Secondary | ICD-10-CM

## 2017-12-22 ENCOUNTER — Ambulatory Visit (INDEPENDENT_AMBULATORY_CARE_PROVIDER_SITE_OTHER): Payer: Medicare Other | Admitting: *Deleted

## 2017-12-22 DIAGNOSIS — R55 Syncope and collapse: Secondary | ICD-10-CM | POA: Diagnosis not present

## 2017-12-23 LAB — CUP PACEART REMOTE DEVICE CHECK
Implantable Pulse Generator Implant Date: 20180709
MDC IDC SESS DTM: 20190412000504

## 2017-12-23 NOTE — Progress Notes (Signed)
Carelink Summary Report / Loop Recorder 

## 2018-01-04 ENCOUNTER — Other Ambulatory Visit: Payer: Self-pay | Admitting: Nurse Practitioner

## 2018-01-04 DIAGNOSIS — G2581 Restless legs syndrome: Secondary | ICD-10-CM

## 2018-01-05 MED ORDER — ROPINIROLE HCL 5 MG PO TABS: 5.0000 mg | ORAL_TABLET | Freq: Every day | ORAL | 0 refills | Status: DC

## 2018-01-15 LAB — CUP PACEART REMOTE DEVICE CHECK
MDC IDC PG IMPLANT DT: 20180709
MDC IDC SESS DTM: 20190515003912

## 2018-01-25 ENCOUNTER — Ambulatory Visit (INDEPENDENT_AMBULATORY_CARE_PROVIDER_SITE_OTHER): Payer: Medicare Other | Admitting: *Deleted

## 2018-01-25 DIAGNOSIS — R55 Syncope and collapse: Secondary | ICD-10-CM | POA: Diagnosis not present

## 2018-01-25 NOTE — Progress Notes (Signed)
Carelink Summary Report / Loop Recorder 

## 2018-02-18 ENCOUNTER — Telehealth: Payer: Self-pay | Admitting: Cardiology

## 2018-02-18 NOTE — Telephone Encounter (Signed)
Spoke w/ pt and requested that she send a manual transmission b/c her home monitor has not updated in at least 14 days.   

## 2018-02-25 ENCOUNTER — Encounter: Payer: Medicare Other | Admitting: *Deleted

## 2018-02-26 LAB — CUP PACEART REMOTE DEVICE CHECK
Date Time Interrogation Session: 20190617003521
MDC IDC PG IMPLANT DT: 20180709

## 2018-03-01 ENCOUNTER — Ambulatory Visit (INDEPENDENT_AMBULATORY_CARE_PROVIDER_SITE_OTHER): Payer: Medicare Other | Admitting: *Deleted

## 2018-03-01 DIAGNOSIS — R55 Syncope and collapse: Secondary | ICD-10-CM | POA: Diagnosis not present

## 2018-03-01 NOTE — Progress Notes (Signed)
Carelink Summary Report / Loop Recorder 

## 2018-03-08 ENCOUNTER — Other Ambulatory Visit: Payer: Self-pay

## 2018-03-08 ENCOUNTER — Other Ambulatory Visit: Payer: Self-pay | Admitting: Nurse Practitioner

## 2018-03-08 DIAGNOSIS — E782 Mixed hyperlipidemia: Secondary | ICD-10-CM

## 2018-03-08 MED ORDER — PRAVASTATIN SODIUM 20 MG PO TABS: ORAL_TABLET | ORAL | 0 refills | Status: DC

## 2018-03-09 MED ORDER — PRAVASTATIN SODIUM 20 MG PO TABS: ORAL_TABLET | ORAL | 0 refills | Status: DC

## 2018-03-10 DIAGNOSIS — L821 Other seborrheic keratosis: Secondary | ICD-10-CM | POA: Diagnosis not present

## 2018-03-10 DIAGNOSIS — L820 Inflamed seborrheic keratosis: Secondary | ICD-10-CM | POA: Diagnosis not present

## 2018-03-12 ENCOUNTER — Ambulatory Visit: Payer: Medicare Other | Admitting: Nurse Practitioner

## 2018-03-12 ENCOUNTER — Encounter: Payer: Self-pay | Admitting: Nurse Practitioner

## 2018-03-12 VITALS — BP 140/78 | HR 76 | Temp 99.0°F | Resp 16 | Ht 65.5 in | Wt 150.0 lb

## 2018-03-12 DIAGNOSIS — K649 Unspecified hemorrhoids: Secondary | ICD-10-CM | POA: Diagnosis not present

## 2018-03-12 MED ORDER — DOCUSATE SODIUM 50 MG PO CAPS: 50.0000 mg | ORAL_CAPSULE | Freq: Every day | ORAL | 0 refills | Status: DC | PRN

## 2018-03-12 MED ORDER — HYDROCORTISONE 2.5 % RE CREA: 1.0000 "application " | TOPICAL_CREAM | Freq: Two times a day (BID) | RECTAL | 0 refills | Status: DC

## 2018-03-12 NOTE — Progress Notes (Signed)
Name: Andrea Kelley   MRN: [id number]    DOB: 1941/07/21   Date:03/12/2018       Progress Note  Subjective  Chief Complaint  Chief Complaint  Patient presents with  . Cyst    states she has a knot or boil at the opening of her anus, noticed it 4 days ago, does not know it has been there longer    HPI  Andrea Kelley is here today for for evaluation of an acute complaint of skin problem. She describes as a painless, nontender, soft knot around her rectum, first noticed about 1 week ago. She denies fevers, weakness, abdominal pain, pelvic pain, nausea, vomiting, urinary symptoms, diarrhea, rectal bleeding, drainage. She says she does not feel constipated, and reports regular bowel movements, but does have to strain during bowel movements sometimes.  She has tried soaking the area in warm water with no improvement.    Patient Active Problem List   Diagnosis Date Noted  . Essential (primary) hypertension 05/20/2017  . Syncope 02/09/2017  . DDD (degenerative disc disease), lumbar 01/16/2011  . RESTLESS LEG SYNDROME 06/07/2009  . Hyperglycemia 06/07/2009  . Hyperlipidemia 07/19/2007    Social History   Tobacco Use  . Smoking status: Never Smoker  . Smokeless tobacco: Never Used  Substance Use Topics  . Alcohol use: Yes    Comment: Socially , 1-2 glasseswine / night     Current Outpatient Medications:  .  losartan (COZAAR) 50 MG tablet, Take 1 tablet (50 mg total) by mouth daily., Disp: 90 tablet, Rfl: 1 .  pravastatin (PRAVACHOL) 20 MG tablet, TAKE 1 TABLET(20 MG) BY MOUTH DAILY. Need updated lab work for more refills., Disp: 30 tablet, Rfl: 0 .  ropinirole (REQUIP) 5 MG tablet, Take 1 tablet (5 mg total) by mouth at bedtime. Must keep schedule appt w/new provider for future refills, Disp: 90 tablet, Rfl: 0  Allergies  Allergen Reactions  . Sulfonamide Derivatives Rash    REACTION: rash as child    ROS  No other specific complaints in a complete review of systems (except as  listed in HPI above).  Objective  Vitals:   03/12/18 1459  BP: 140/78  Pulse: 76  Resp: 16  Temp: 99 F (37.2 C)  TempSrc: Oral  SpO2: 98%  Weight: 150 lb (68 kg)  Height: 5' 5.5" (1.664 m)    Body mass index is 24.58 kg/m.  Nursing Note and Vital Signs reviewed.  Physical Exam  Constitutional: Patient appears well-developed and well-nourished. No distress.  HEENT: head atraumatic, normocephalic, pupils equal and reactive to light, EOM's intact, neck supple, oropharynx pink and moist without exudate Cardiovascular: Normal rate, regular rhythm, and distal pulses intact Pulmonary/Chest: Effort normal and breath sounds clear. No respiratory distress or retractions. Abdominal: Soft and non-tender, no distension. Genitourinary: Rectum with external hemorrhoid, otherwise normal. Neurological: She is alert and oriented to person, place, and time. No cranial nerve deficit. Coordination, balance, strength, speech and gait are normal.  Psychiatric: Patient has a normal mood and affect. behavior is normal. Judgment and thought content normal.   Assessment & Plan RTC for CPE with fasting lab work  -Reviewed Health Maintenance: up to date  1. Hemorrhoids, unspecified hemorrhoid type Rx for stool softener and anusol send-dosing and side effects discussed Discussed referral to gastroenterology for further evaluation and treatment of hemorrhoids and she is agreeable Home management of hemorrhoids and constipation, Red flags and when to present for emergency care or RTC including  new/worsening/un-resolving symptoms  reviewed with patient at time of visit. Follow up and care instructions discussed and provided in AVS. - hydrocortisone (ANUSOL-HC) 2.5 % rectal cream; Place 1 application rectally 2 (two) times daily.  Dispense: 30 g; Refill: 0 - docusate sodium (COLACE) 50 MG capsule; Take 1 capsule (50 mg total) by mouth daily as needed for mild constipation or moderate constipation.   Dispense: 30 capsule; Refill: 0 - Ambulatory referral to Gastroenterology

## 2018-03-12 NOTE — Patient Instructions (Addendum)
I have sent prescription for a stool softener colace as needed and anusol cream as needed.  Hemorrhoids Hemorrhoids are swollen veins in and around the rectum or anus. There are two types of hemorrhoids:  Internal hemorrhoids. These occur in the veins that are just inside the rectum. They may poke through to the outside and become irritated and painful.  External hemorrhoids. These occur in the veins that are outside of the anus and can be felt as a painful swelling or hard lump near the anus.  Most hemorrhoids do not cause serious problems, and they can be managed with home treatments such as diet and lifestyle changes. If home treatments do not help your symptoms, procedures can be done to shrink or remove the hemorrhoids. What are the causes? This condition is caused by increased pressure in the anal area. This pressure may result from various things, including:  Constipation.  Straining to have a bowel movement.  Diarrhea.  Pregnancy.  Obesity.  Sitting for long periods of time.  Heavy lifting or other activity that causes you to strain.  Anal sex.  What are the signs or symptoms? Symptoms of this condition include:  Pain.  Anal itching or irritation.  Rectal bleeding.  Leakage of stool (feces).  Anal swelling.  One or more lumps around the anus.  How is this diagnosed? This condition can often be diagnosed through a visual exam. Other exams or tests may also be done, such as:  Examination of the rectal area with a gloved hand (digital rectal exam).  Examination of the anal canal using a small tube (anoscope).  A blood test, if you have lost a significant amount of blood.  A test to look inside the colon (sigmoidoscopy or colonoscopy).  How is this treated? This condition can usually be treated at home. However, various procedures may be done if dietary changes, lifestyle changes, and other home treatments do not help your symptoms. These procedures can  help make the hemorrhoids smaller or remove them completely. Some of these procedures involve surgery, and others do not. Common procedures include:  Rubber band ligation. Rubber bands are placed at the base of the hemorrhoids to cut off the blood supply to them.  Sclerotherapy. Medicine is injected into the hemorrhoids to shrink them.  Infrared coagulation. A type of light energy is used to get rid of the hemorrhoids.  Hemorrhoidectomy surgery. The hemorrhoids are surgically removed, and the veins that supply them are tied off.  Stapled hemorrhoidopexy surgery. A circular stapling device is used to remove the hemorrhoids and use staples to cut off the blood supply to them.  Follow these instructions at home: Eating and drinking  Eat foods that have a lot of fiber in them, such as whole grains, beans, nuts, fruits, and vegetables. Ask your health care provider about taking products that have added fiber (fiber supplements).  Drink enough fluid to keep your urine clear or pale yellow. Managing pain and swelling  Take warm sitz baths for 20 minutes, 3-4 times a day to ease pain and discomfort.  If directed, apply ice to the affected area. Using ice packs between sitz baths may be helpful. ? Put ice in a plastic bag. ? Place a towel between your skin and the bag. ? Leave the ice on for 20 minutes, 2-3 times a day. General instructions  Take over-the-counter and prescription medicines only as told by your health care provider.  Use medicated creams or suppositories as told.  Exercise regularly.  Go to the bathroom when you have the urge to have a bowel movement. Do not wait.  Avoid straining to have bowel movements.  Keep the anal area dry and clean. Use wet toilet paper or moist towelettes after a bowel movement.  Do not sit on the toilet for long periods of time. This increases blood pooling and pain. Contact a health care provider if:  You have increasing pain and swelling  that are not controlled by treatment or medicine.  You have uncontrolled bleeding.  You have difficulty having a bowel movement, or you are unable to have a bowel movement.  You have pain or inflammation outside the area of the hemorrhoids. This information is not intended to replace advice given to you by your health care provider. Make sure you discuss any questions you have with your health care provider. Document Released: 07/25/2000 Document Revised: 12/26/2015 Document Reviewed: 04/11/2015 Elsevier Interactive Patient Education  [employer]2018 [employer]

## 2018-03-15 ENCOUNTER — Encounter: Payer: Self-pay | Admitting: Gastroenterology

## 2018-03-16 DIAGNOSIS — H26493 Other secondary cataract, bilateral: Secondary | ICD-10-CM | POA: Diagnosis not present

## 2018-03-31 ENCOUNTER — Ambulatory Visit (INDEPENDENT_AMBULATORY_CARE_PROVIDER_SITE_OTHER): Payer: Medicare Other | Admitting: *Deleted

## 2018-03-31 DIAGNOSIS — R55 Syncope and collapse: Secondary | ICD-10-CM

## 2018-03-31 DIAGNOSIS — I10 Essential (primary) hypertension: Secondary | ICD-10-CM

## 2018-04-01 ENCOUNTER — Encounter (INDEPENDENT_AMBULATORY_CARE_PROVIDER_SITE_OTHER): Payer: Medicare Other | Admitting: Ophthalmology

## 2018-04-01 DIAGNOSIS — H318 Other specified disorders of choroid: Secondary | ICD-10-CM | POA: Diagnosis not present

## 2018-04-01 DIAGNOSIS — I10 Essential (primary) hypertension: Secondary | ICD-10-CM

## 2018-04-01 DIAGNOSIS — H35033 Hypertensive retinopathy, bilateral: Secondary | ICD-10-CM | POA: Diagnosis not present

## 2018-04-01 DIAGNOSIS — H353112 Nonexudative age-related macular degeneration, right eye, intermediate dry stage: Secondary | ICD-10-CM

## 2018-04-01 DIAGNOSIS — H43813 Vitreous degeneration, bilateral: Secondary | ICD-10-CM

## 2018-04-01 NOTE — Progress Notes (Signed)
Carelink Summary Report / Loop Recorder 

## 2018-04-02 ENCOUNTER — Other Ambulatory Visit: Payer: Self-pay | Admitting: *Deleted

## 2018-04-02 DIAGNOSIS — G2581 Restless legs syndrome: Secondary | ICD-10-CM

## 2018-04-02 MED ORDER — ROPINIROLE HCL 5 MG PO TABS: 5.0000 mg | ORAL_TABLET | Freq: Every day | ORAL | 0 refills | Status: DC

## 2018-04-08 ENCOUNTER — Other Ambulatory Visit: Payer: Self-pay | Admitting: *Deleted

## 2018-04-08 ENCOUNTER — Encounter (INDEPENDENT_AMBULATORY_CARE_PROVIDER_SITE_OTHER): Payer: Medicare Other | Admitting: Ophthalmology

## 2018-04-08 DIAGNOSIS — H318 Other specified disorders of choroid: Secondary | ICD-10-CM

## 2018-04-08 DIAGNOSIS — E782 Mixed hyperlipidemia: Secondary | ICD-10-CM

## 2018-04-08 MED ORDER — PRAVASTATIN SODIUM 20 MG PO TABS: ORAL_TABLET | ORAL | 2 refills | Status: DC

## 2018-04-15 LAB — CUP PACEART REMOTE DEVICE CHECK
Date Time Interrogation Session: 20190720013710
Implantable Pulse Generator Implant Date: 20180709

## 2018-05-03 ENCOUNTER — Ambulatory Visit (INDEPENDENT_AMBULATORY_CARE_PROVIDER_SITE_OTHER): Payer: Medicare Other | Admitting: *Deleted

## 2018-05-03 DIAGNOSIS — R55 Syncope and collapse: Secondary | ICD-10-CM

## 2018-05-04 LAB — CUP PACEART REMOTE DEVICE CHECK
Date Time Interrogation Session: 20190822023825
MDC IDC PG IMPLANT DT: 20180709

## 2018-05-04 NOTE — Progress Notes (Signed)
Carelink Summary Report / Loop Recorder 

## 2018-05-06 ENCOUNTER — Encounter (INDEPENDENT_AMBULATORY_CARE_PROVIDER_SITE_OTHER): Payer: Medicare Other | Admitting: Ophthalmology

## 2018-05-06 DIAGNOSIS — H318 Other specified disorders of choroid: Secondary | ICD-10-CM

## 2018-05-10 LAB — CUP PACEART REMOTE DEVICE CHECK
Date Time Interrogation Session: 20190924023533
Implantable Pulse Generator Implant Date: 20180709

## 2018-05-11 ENCOUNTER — Encounter

## 2018-05-11 ENCOUNTER — Ambulatory Visit: Payer: Medicare Other | Admitting: Gastroenterology

## 2018-05-27 ENCOUNTER — Ambulatory Visit: Payer: Medicare Other | Admitting: Nurse Practitioner

## 2018-05-27 ENCOUNTER — Encounter: Payer: Self-pay | Admitting: Nurse Practitioner

## 2018-05-27 ENCOUNTER — Ambulatory Visit (INDEPENDENT_AMBULATORY_CARE_PROVIDER_SITE_OTHER)
Admission: RE | Admit: 2018-05-27 | Discharge: 2018-05-27 | Disposition: A | Discharge status: Home / Self Care | Payer: Medicare Other | Source: Ambulatory Visit | Attending: Nurse Practitioner | Admitting: Nurse Practitioner

## 2018-05-27 VITALS — BP 140/84 | HR 64 | Ht 65.5 in | Wt 152.0 lb

## 2018-05-27 DIAGNOSIS — Z23 Encounter for immunization: Secondary | ICD-10-CM

## 2018-05-27 DIAGNOSIS — R7309 Other abnormal glucose: Secondary | ICD-10-CM

## 2018-05-27 DIAGNOSIS — I10 Essential (primary) hypertension: Secondary | ICD-10-CM | POA: Diagnosis not present

## 2018-05-27 DIAGNOSIS — G2581 Restless legs syndrome: Secondary | ICD-10-CM

## 2018-05-27 DIAGNOSIS — Z13820 Encounter for screening for osteoporosis: Secondary | ICD-10-CM

## 2018-05-27 DIAGNOSIS — Z0000 Encounter for general adult medical examination without abnormal findings: Secondary | ICD-10-CM

## 2018-05-27 DIAGNOSIS — E782 Mixed hyperlipidemia: Secondary | ICD-10-CM

## 2018-05-27 MED ORDER — ROPINIROLE HCL 5 MG PO TABS: 5.0000 mg | ORAL_TABLET | Freq: Every day | ORAL | 3 refills | Status: DC

## 2018-05-27 MED ORDER — LOSARTAN POTASSIUM 50 MG PO TABS: 50.0000 mg | ORAL_TABLET | Freq: Every day | ORAL | 3 refills | Status: DC

## 2018-05-27 MED ORDER — PRAVASTATIN SODIUM 20 MG PO TABS: ORAL_TABLET | ORAL | 3 refills | Status: DC

## 2018-05-27 NOTE — Assessment & Plan Note (Signed)
Stable Continue current medication Update labs - CBC; Future - Comprehensive metabolic panel; Future - Lipid panel; Future - losartan (COZAAR) 50 MG tablet; Take 1 tablet (50 mg total) by mouth daily.  Dispense: 90 tablet; Refill: 3

## 2018-05-27 NOTE — Assessment & Plan Note (Signed)
Stable Continue current medication F/u for new, worsening symptoms - ropinirole (REQUIP) 5 MG tablet; Take 1 tablet (5 mg total) by mouth at bedtime. Must keep schedule appt w/new provider for future refills  Dispense: 90 tablet; Refill: 3

## 2018-05-27 NOTE — Assessment & Plan Note (Signed)
stable Continue current medication Update labs- she will return for labs when fasting - CBC; Future - Comprehensive metabolic panel; Future - Lipid panel; Future - pravastatin (PRAVACHOL) 20 MG tablet; TAKE 1 TABLET(20 MG) BY MOUTH DAILY. Need updated lab work for more refills.  Dispense: 90 tablet; Refill: 3

## 2018-05-27 NOTE — Patient Instructions (Signed)
Return for labs when fasting  It was nice to see you today  Health Maintenance, Female Adopting a healthy lifestyle and getting preventive care can go a long way to promote health and wellness. Talk with your health care provider about what schedule of regular examinations is right for you. This is a good chance for you to check in with your provider about disease prevention and staying healthy. In between checkups, there are plenty of things you can do on your own. Experts have done a lot of research about which lifestyle changes and preventive measures are most likely to keep you healthy. Ask your health care provider for more information. Weight and diet Eat a healthy diet  Be sure to include plenty of vegetables, fruits, low-fat dairy products, and lean protein.  Do not eat a lot of foods high in solid fats, added sugars, or salt.  Get regular exercise. This is one of the most important things you can do for your health. ? Most adults should exercise for at least 150 minutes each week. The exercise should increase your heart rate and make you sweat (moderate-intensity exercise). ? Most adults should also do strengthening exercises at least twice a week. This is in addition to the moderate-intensity exercise.  Maintain a healthy weight  Body mass index (BMI) is a measurement that can be used to identify possible weight problems. It estimates body fat based on height and weight. Your health care provider can help determine your BMI and help you achieve or maintain a healthy weight.  For females 66 years of age and older: ? A BMI below 18.5 is considered underweight. ? A BMI of 18.5 to 24.9 is normal. ? A BMI of 25 to 29.9 is considered overweight. ? A BMI of 30 and above is considered obese.  Watch levels of cholesterol and blood lipids  You should start having your blood tested for lipids and cholesterol at 78 years of age, then have this test every 5 years.  You may need to have  your cholesterol levels checked more often if: ? Your lipid or cholesterol levels are high. ? You are older than 78 years of age. ? You are at high risk for heart disease.  Cancer screening Lung Cancer  Lung cancer screening is recommended for adults 46-72 years old who are at high risk for lung cancer because of a history of smoking.  A yearly low-dose CT scan of the lungs is recommended for people who: ? Currently smoke. ? Have quit within the past 15 years. ? Have at least a 30-pack-year history of smoking. A pack year is smoking an average of one pack of cigarettes a day for 1 year.  Yearly screening should continue until it has been 15 years since you quit.  Yearly screening should stop if you develop a health problem that would prevent you from having lung cancer treatment.  Breast Cancer  Practice breast self-awareness. This means understanding how your breasts normally appear and feel.  It also means doing regular breast self-exams. Let your health care provider know about any changes, no matter how small.  If you are in your 20s or 30s, you should have a clinical breast exam (CBE) by a health care provider every 1-3 years as part of a regular health exam.  If you are 79 or older, have a CBE every year. Also consider having a breast X-ray (mammogram) every year.  If you have a family history of breast cancer, talk to  your health care provider about genetic screening.  If you are at high risk for breast cancer, talk to your health care provider about having an MRI and a mammogram every year.  Breast cancer gene (BRCA) assessment is recommended for women who have family members with BRCA-related cancers. BRCA-related cancers include: ? Breast. ? Ovarian. ? Tubal. ? Peritoneal cancers.  Results of the assessment will determine the need for genetic counseling and BRCA1 and BRCA2 testing.  Cervical Cancer Your health care provider may recommend that you be screened  regularly for cancer of the pelvic organs (ovaries, uterus, and vagina). This screening involves a pelvic examination, including checking for microscopic changes to the surface of your cervix (Pap test). You may be encouraged to have this screening done every 3 years, beginning at age 80.  For women ages 9-65, health care providers may recommend pelvic exams and Pap testing every 3 years, or they may recommend the Pap and pelvic exam, combined with testing for human papilloma virus (HPV), every 5 years. Some types of HPV increase your risk of cervical cancer. Testing for HPV may also be done on women of any age with unclear Pap test results.  Other health care providers may not recommend any screening for nonpregnant women who are considered low risk for pelvic cancer and who do not have symptoms. Ask your health care provider if a screening pelvic exam is right for you.  If you have had past treatment for cervical cancer or a condition that could lead to cancer, you need Pap tests and screening for cancer for at least 20 years after your treatment. If Pap tests have been discontinued, your risk factors (such as having a new sexual partner) need to be reassessed to determine if screening should resume. Some women have medical problems that increase the chance of getting cervical cancer. In these cases, your health care provider may recommend more frequent screening and Pap tests.  Colorectal Cancer  This type of cancer can be detected and often prevented.  Routine colorectal cancer screening usually begins at 78 years of age and continues through 78 years of age.  Your health care provider may recommend screening at an earlier age if you have risk factors for colon cancer.  Your health care provider may also recommend using home test kits to check for hidden blood in the stool.  A small camera at the end of a tube can be used to examine your colon directly (sigmoidoscopy or colonoscopy). This is  done to check for the earliest forms of colorectal cancer.  Routine screening usually begins at age 45.  Direct examination of the colon should be repeated every 5-10 years through 78 years of age. However, you may need to be screened more often if early forms of precancerous polyps or small growths are found.  Skin Cancer  Check your skin from head to toe regularly.  Tell your health care provider about any new moles or changes in moles, especially if there is a change in a mole's shape or color.  Also tell your health care provider if you have a mole that is larger than the size of a pencil eraser.  Always use sunscreen. Apply sunscreen liberally and repeatedly throughout the day.  Protect yourself by wearing long sleeves, pants, a wide-brimmed hat, and sunglasses whenever you are outside.  Heart disease, diabetes, and high blood pressure  High blood pressure causes heart disease and increases the risk of stroke. High blood pressure is more likely  to develop in: ? People who have blood pressure in the high end of the normal range (130-139/85-89 mm Hg). ? People who are overweight or obese. ? People who are African American.  If you are 41-24 years of age, have your blood pressure checked every 3-5 years. If you are 31 years of age or older, have your blood pressure checked every year. You should have your blood pressure measured twice-once when you are at a hospital or clinic, and once when you are not at a hospital or clinic. Record the average of the two measurements. To check your blood pressure when you are not at a hospital or clinic, you can use: ? An automated blood pressure machine at a pharmacy. ? A home blood pressure monitor.  If you are between 45 years and 72 years old, ask your health care provider if you should take aspirin to prevent strokes.  Have regular diabetes screenings. This involves taking a blood sample to check your fasting blood sugar level. ? If you are  at a normal weight and have a low risk for diabetes, have this test once every three years after 78 years of age. ? If you are overweight and have a high risk for diabetes, consider being tested at a younger age or more often. Preventing infection Hepatitis B  If you have a higher risk for hepatitis B, you should be screened for this virus. You are considered at high risk for hepatitis B if: ? You were born in a country where hepatitis B is common. Ask your health care provider which countries are considered high risk. ? Your parents were born in a high-risk country, and you have not been immunized against hepatitis B (hepatitis B vaccine). ? You have HIV or AIDS. ? You use needles to inject street drugs. ? You live with someone who has hepatitis B. ? You have had sex with someone who has hepatitis B. ? You get hemodialysis treatment. ? You take certain medicines for conditions, including cancer, organ transplantation, and autoimmune conditions.  Hepatitis C  Blood testing is recommended for: ? Everyone born from [birth year] through 1966. ? Anyone with known risk factors for hepatitis C.  Sexually transmitted infections (STIs)  You should be screened for sexually transmitted infections (STIs) including gonorrhea and chlamydia if: ? You are sexually active and are younger than 78 years of age. ? You are older than 78 years of age and your health care provider tells you that you are at risk for this type of infection. ? Your sexual activity has changed since you were last screened and you are at an increased risk for chlamydia or gonorrhea. Ask your health care provider if you are at risk.  If you do not have HIV, but are at risk, it may be recommended that you take a prescription medicine daily to prevent HIV infection. This is called pre-exposure prophylaxis (PrEP). You are considered at risk if: ? You are sexually active and do not regularly use condoms or know the HIV status of your  partner(s). ? You take drugs by injection. ? You are sexually active with a partner who has HIV.  Talk with your health care provider about whether you are at high risk of being infected with HIV. If you choose to begin PrEP, you should first be tested for HIV. You should then be tested every 3 months for as long as you are taking PrEP. Pregnancy  If you are premenopausal and you may become  pregnant, ask your health care provider about preconception counseling.  If you may become pregnant, take 400 to 800 micrograms (mcg) of folic acid every day.  If you want to prevent pregnancy, talk to your health care provider about birth control (contraception). Osteoporosis and menopause  Osteoporosis is a disease in which the bones lose minerals and strength with aging. This can result in serious bone fractures. Your risk for osteoporosis can be identified using a bone density scan.  If you are 72 years of age or older, or if you are at risk for osteoporosis and fractures, ask your health care provider if you should be screened.  Ask your health care provider whether you should take a calcium or vitamin D supplement to lower your risk for osteoporosis.  Menopause may have certain physical symptoms and risks.  Hormone replacement therapy may reduce some of these symptoms and risks. Talk to your health care provider about whether hormone replacement therapy is right for you. Follow these instructions at home:  Schedule regular health, dental, and eye exams.  Stay current with your immunizations.  Do not use any tobacco products including cigarettes, chewing tobacco, or electronic cigarettes.  If you are pregnant, do not drink alcohol.  If you are breastfeeding, limit how much and how often you drink alcohol.  Limit alcohol intake to no more than 1 drink per day for nonpregnant women. One drink equals 12 ounces of beer, 5 ounces of wine, or 1 ounces of hard liquor.  Do not use street  drugs.  Do not share needles.  Ask your health care provider for help if you need support or information about quitting drugs.  Tell your health care provider if you often feel depressed.  Tell your health care provider if you have ever been abused or do not feel safe at home. This information is not intended to replace advice given to you by your health care provider. Make sure you discuss any questions you have with your health care provider. Document Released: 02/10/2011 Document Revised: 01/03/2016 Document Reviewed: 05/01/2015 Elsevier Interactive Patient Education  [employer].

## 2018-05-27 NOTE — Assessment & Plan Note (Addendum)
Routine general medical examination at a health care facility  Reviewed annual screening exams, healthy lifestyle, additional information provided on AVS - CBC; Future - Comprehensive metabolic panel; Future - Lipid panel; Future - Hemoglobin A1c; Future - DG Bone Density; Future  Need for influenza vaccination- Flu vaccine HIGH DOSE PF  Screening for osteoporosis- DG Bone Density; Future Was maintained on medication for osteoporosis in past but stopped taking due to no improvement in dexa, will update DEXA to determine further plan of care  Elevated glucose- Hemoglobin A1c; Future

## 2018-05-27 NOTE — Progress Notes (Signed)
Andrea Kelley is a 77 y.o. female with the following history as recorded in [employer]:  Patient Active Problem List   Diagnosis Date Noted  . Essential (primary) hypertension 05/20/2017  . Syncope 02/09/2017  . DDD (degenerative disc disease), lumbar 01/16/2011  . RESTLESS LEG SYNDROME 06/07/2009  . Hyperglycemia 06/07/2009  . Hyperlipidemia 07/19/2007    Current Outpatient Medications  Medication Sig Dispense Refill  . docusate sodium (COLACE) 50 MG capsule Take 1 capsule (50 mg total) by mouth daily as needed for mild constipation or moderate constipation. 30 capsule 0  . hydrocortisone (ANUSOL-HC) 2.5 % rectal cream Place 1 application rectally 2 (two) times daily. 30 g 0  . losartan (COZAAR) 50 MG tablet Take 1 tablet (50 mg total) by mouth daily. 90 tablet 3  . pravastatin (PRAVACHOL) 20 MG tablet TAKE 1 TABLET(20 MG) BY MOUTH DAILY. Need updated lab work for more refills. 90 tablet 3  . ropinirole (REQUIP) 5 MG tablet Take 1 tablet (5 mg total) by mouth at bedtime. Must keep schedule appt w/new provider for future refills 90 tablet 3   No current facility-administered medications for this visit.     Allergies: Sulfonamide derivatives  Past Medical History:  Diagnosis Date  . DDD (degenerative disc disease), lumbar   . Hyperlipidemia   . Osteopenia    [facility]  . Syncope     Past Surgical History:  Procedure Laterality Date  . ABDOMINAL HYSTERECTOMY     For dysfunctional bleeding; no BSO  . BACK SURGERY  1970   ruptured disc ; Dr Roxan Hockey  . COLONOSCOPY  2001   Negative; done for rectal bleeding  . LOOP RECORDER INSERTION N/A 02/16/2017   Procedure: Loop Recorder Insertion;  Surgeon: Thurmon Fair, MD;  Location: [facility];  Service: Cardiovascular;  Laterality: N/A;    Family History  Problem Relation Age of Onset  . Lung cancer Father        smoker  . Rheum arthritis Father   . Heart attack Mother        68s  . Stroke Mother        in 14s  .  Breast cancer Maternal Aunt   . Diabetes Neg Hx     Social History   Tobacco Use  . Smoking status: Never Smoker  . Smokeless tobacco: Never Used  Substance Use Topics  . Alcohol use: Yes    Comment: Socially , 1-2 glasseswine / night     Subjective:  Andrea Kelley is here today for annual CPE, medication refills.  CPE-  Last dental exam: 2019 Last vision exam: 2019 Lipids: lipid panel today DM screening- A1c  HM TAB/Vaccinations: up to date Diet and exercise: no regular diet or exercise  No LMP recorded. Patient has had a hysterectomy.  Restless legs- maintained on ropinirole 5 daily for some time now, no adverse effects, reports adequate control of symptoms. She was going to f/u with a neurologist earlier this year for further evaluation but decided not to go and feels condition is stable at this time.  Hypertension -maintained on losartan 50 daily Reports daily medication compliance without adverse medication effects.  BP Readings from Last 3 Encounters:  05/27/18 140/84  03/12/18 140/78  11/06/17 140/82   HLD- maintained on pravastatin 20 daily Reports daily medication compliance without adverse medication effects including myalgias.  Lab Results  Component Value Date   CHOL 151 12/26/2016   HDL 46.20 12/26/2016   LDLCALC 80 12/26/2016  LDLDIRECT 90.0 10/14/2016   TRIG 127.0 12/26/2016   CHOLHDL 3 12/26/2016     Review of Systems  Constitutional: Negative for chills, fever and weight loss.  HENT: Negative for hearing loss and sore throat.   Eyes: Negative for blurred vision and double vision.  Respiratory: Negative for cough and shortness of breath.   Cardiovascular: Negative for chest pain and palpitations.  Gastrointestinal: Negative for constipation, diarrhea and heartburn.  Genitourinary: Negative for dysuria and hematuria.  Musculoskeletal: Negative for falls.  Skin: Negative for rash.  Neurological: Negative for speech change, loss of consciousness  and headaches.  Endo/Heme/Allergies: Does not bruise/bleed easily.  Psychiatric/Behavioral: Negative for depression. The patient is not nervous/anxious.     Objective:  Vitals:   05/27/18 1052  BP: 140/84  Pulse: 64  SpO2: 98%  Weight: 152 lb (68.9 kg)  Height: 5' 5.5" (1.664 m)    General: Well developed, well nourished, in no acute distress  Skin : Warm and dry.  Head: Normocephalic and atraumatic  Eyes: Sclera and conjunctiva clear; pupils round and reactive to light; extraocular movements intact  Ears: External normal; canals clear; tympanic membranes normal  Oropharynx: Pink, supple. No suspicious lesions  Neck: Supple without thyromegaly, adenopathy  Lungs: Respirations unlabored; clear to auscultation bilaterally without wheeze, rales, rhonchi  CVS exam: normal rate, regular rhythm, normal S1, S2, no murmurs, rubs, clicks or gallops.  Abdomen: Soft; nontender; nondistended; normoactive bowel sounds; no masses or hepatosplenomegaly  Musculoskeletal: No deformities; no active joint inflammation  Extremities: No edema, cyanosis Vessels: Symmetric bilaterally  Neurologic: Alert and oriented; speech intact; face symmetrical; moves all extremities well; CNII-XII intact without focal deficit   Assessment:  1. Routine general medical examination at a health care facility   2. Need for influenza vaccination   3. Essential (primary) hypertension   4. Mixed hyperlipidemia   5. RESTLESS LEG SYNDROME   6. Screening for osteoporosis   7. Elevated glucose     Plan:   Return in about 1 year (around 05/28/2019) for CPE.  Orders Placed This Encounter  Procedures  . DG Bone Density    Standing Status:   Future    Standing Expiration Date:   07/28/2019    Order Specific Question:   Reason for Exam (SYMPTOM  OR DIAGNOSIS REQUIRED)    Answer:   screening    Order Specific Question:   Preferred imaging location?    Answer:   [facility]  . Flu vaccine HIGH DOSE PF  . CBC     Standing Status:   Future    Standing Expiration Date:   05/28/2019  . Comprehensive metabolic panel    Standing Status:   Future    Standing Expiration Date:   05/28/2019  . Lipid panel    Standing Status:   Future    Standing Expiration Date:   05/28/2019  . Hemoglobin A1c    Standing Status:   Future    Standing Expiration Date:   05/28/2019    Requested Prescriptions   Signed Prescriptions Disp Refills  . losartan (COZAAR) 50 MG tablet 90 tablet 3    Sig: Take 1 tablet (50 mg total) by mouth daily.  . pravastatin (PRAVACHOL) 20 MG tablet 90 tablet 3    Sig: TAKE 1 TABLET(20 MG) BY MOUTH DAILY. Need updated lab work for more refills.  . ropinirole (REQUIP) 5 MG tablet 90 tablet 3    Sig: Take 1 tablet (5 mg total) by mouth at bedtime. Must  keep schedule appt w/new provider for future refills

## 2018-05-30 DIAGNOSIS — Z13820 Encounter for screening for osteoporosis: Secondary | ICD-10-CM | POA: Diagnosis not present

## 2018-05-31 ENCOUNTER — Other Ambulatory Visit (INDEPENDENT_AMBULATORY_CARE_PROVIDER_SITE_OTHER): Payer: Medicare Other

## 2018-05-31 DIAGNOSIS — Z0000 Encounter for general adult medical examination without abnormal findings: Secondary | ICD-10-CM | POA: Diagnosis not present

## 2018-05-31 DIAGNOSIS — I10 Essential (primary) hypertension: Secondary | ICD-10-CM | POA: Diagnosis not present

## 2018-05-31 DIAGNOSIS — E782 Mixed hyperlipidemia: Secondary | ICD-10-CM

## 2018-05-31 DIAGNOSIS — R7309 Other abnormal glucose: Secondary | ICD-10-CM

## 2018-05-31 LAB — COMPREHENSIVE METABOLIC PANEL
ALBUMIN: 4 g/dL (ref 3.5–5.2)
ALK PHOS: 71 U/L (ref 39–117)
ALT: 14 U/L (ref 0–35)
AST: 12 U/L (ref 0–37)
BUN: 20 mg/dL (ref 6–23)
CO2: 24 mEq/L (ref 19–32)
Calcium: 9.2 mg/dL (ref 8.4–10.5)
Chloride: 107 mEq/L (ref 96–112)
Creatinine, Ser: 0.79 mg/dL (ref 0.40–1.20)
GFR: 74.76 mL/min (ref >60.00)
Glucose, Bld: 101 mg/dL — ABNORMAL HIGH (ref 70–99)
POTASSIUM: 4.4 meq/L (ref 3.5–5.1)
Sodium: 141 mEq/L (ref 135–145)
TOTAL PROTEIN: 6.8 g/dL (ref 6.0–8.3)
Total Bilirubin: 0.4 mg/dL (ref 0.2–1.2)

## 2018-05-31 LAB — LIPID PANEL
Cholesterol: 143 mg/dL (ref 0–200)
HDL: 42.3 mg/dL (ref >39.00)
NonHDL: 100.83
Total CHOL/HDL Ratio: 3
Triglycerides: 250 mg/dL — ABNORMAL HIGH (ref 0.0–149.0)
VLDL: 50 mg/dL — ABNORMAL HIGH (ref 0.0–40.0)

## 2018-05-31 LAB — CBC
HCT: 40 % (ref 36.0–46.0)
HEMOGLOBIN: 13.6 g/dL (ref 12.0–15.0)
MCHC: 34.1 g/dL (ref 30.0–36.0)
MCV: 92 fl (ref 78.0–100.0)
Platelets: 267 10*3/uL (ref 150.0–400.0)
RBC: 4.35 Mil/uL (ref 3.87–5.11)
RDW: 12.8 % (ref 11.5–15.5)
WBC: 7.6 10*3/uL (ref 4.0–10.5)

## 2018-05-31 LAB — LDL CHOLESTEROL, DIRECT: Direct LDL: 82 mg/dL

## 2018-05-31 LAB — HEMOGLOBIN A1C: HEMOGLOBIN A1C: 5.8 % (ref 4.6–6.5)

## 2018-06-07 ENCOUNTER — Ambulatory Visit (INDEPENDENT_AMBULATORY_CARE_PROVIDER_SITE_OTHER): Payer: Medicare Other | Admitting: *Deleted

## 2018-06-07 DIAGNOSIS — R55 Syncope and collapse: Secondary | ICD-10-CM | POA: Diagnosis not present

## 2018-06-07 DIAGNOSIS — I10 Essential (primary) hypertension: Secondary | ICD-10-CM

## 2018-06-08 NOTE — Progress Notes (Signed)
Carelink Summary Report / Loop Recorder 

## 2018-06-23 ENCOUNTER — Telehealth: Payer: Self-pay | Admitting: *Deleted

## 2018-06-23 NOTE — Telephone Encounter (Signed)
LVMOM regarding symptoms associated with tachy episode noted 06/22/18 at 20:58. Gave [facility] phone number to call back.

## 2018-06-30 LAB — CUP PACEART REMOTE DEVICE CHECK
Date Time Interrogation Session: 20191027034028
MDC IDC PG IMPLANT DT: 20180709

## 2018-06-30 NOTE — Telephone Encounter (Signed)
Able to reach patient. Discussed tachy episode from 06/22/18. Patient denies any symptoms with episode, no recent syncope. She was at the dentist earlier that day. Reports compliance with medications. Advised I will route this message and episode ECG to Dr. Royann Shiversroitoru for recommendations.  Patient is busy getting ready for the holidays and an upcoming vacation from 09/08/18-09/21/18. She declines to schedule a f/u with Dr. Herbie BaltimoreHarding (per recall) at this time, will plan to schedule after she returns home. [facility] when she returns from vacation as monitor may become disconnected during that period. She verbalizes understanding of instructions to call the DC for any presyncopal/syncopal episodes.

## 2018-07-05 ENCOUNTER — Telehealth: Payer: Self-pay | Admitting: Nurse Practitioner

## 2018-07-05 NOTE — Telephone Encounter (Signed)
Episode was brief. Plan should be OK MCr

## 2018-07-05 NOTE — Telephone Encounter (Signed)
Insurance has been submitted and verified for Prolia. Patient is responsible for a $250 copay. Due anytime.  Left message for patient to call back to schedule.  Okay to schedule... Visit Note: Prolia ($250 copay - okay to give per [place]) Visit Type: Nurse Provider: Nurse 

## 2018-07-12 ENCOUNTER — Ambulatory Visit (INDEPENDENT_AMBULATORY_CARE_PROVIDER_SITE_OTHER): Payer: Medicare Other

## 2018-07-12 DIAGNOSIS — R55 Syncope and collapse: Secondary | ICD-10-CM

## 2018-07-12 NOTE — Progress Notes (Signed)
Carelink Summary Report / Loop Recorder 

## 2018-07-13 ENCOUNTER — Encounter (INDEPENDENT_AMBULATORY_CARE_PROVIDER_SITE_OTHER): Payer: Medicare Other | Admitting: Ophthalmology

## 2018-07-13 DIAGNOSIS — H353221 Exudative age-related macular degeneration, left eye, with active choroidal neovascularization: Secondary | ICD-10-CM

## 2018-07-13 DIAGNOSIS — H353112 Nonexudative age-related macular degeneration, right eye, intermediate dry stage: Secondary | ICD-10-CM

## 2018-07-13 DIAGNOSIS — H43813 Vitreous degeneration, bilateral: Secondary | ICD-10-CM

## 2018-07-13 DIAGNOSIS — H35033 Hypertensive retinopathy, bilateral: Secondary | ICD-10-CM

## 2018-07-13 DIAGNOSIS — I10 Essential (primary) hypertension: Secondary | ICD-10-CM

## 2018-07-13 NOTE — Telephone Encounter (Signed)
Spoke to patient. She said that she will do some research on Prolia and will call us back to schedule (probably end of February or early March).

## 2018-07-22 ENCOUNTER — Telehealth: Payer: Self-pay | Admitting: Cardiology

## 2018-07-22 NOTE — Telephone Encounter (Signed)
Spoke w/ pt and requested that she send a manual transmission b/c her home monitor has not updated in at least 14 days.   

## 2018-08-09 LAB — CUP PACEART REMOTE DEVICE CHECK
Date Time Interrogation Session: 20191129033713
MDC IDC PG IMPLANT DT: 20180709

## 2018-08-10 ENCOUNTER — Ambulatory Visit (INDEPENDENT_AMBULATORY_CARE_PROVIDER_SITE_OTHER): Payer: Medicare Other

## 2018-08-10 ENCOUNTER — Other Ambulatory Visit: Payer: Self-pay | Admitting: Cardiovascular Disease

## 2018-08-10 DIAGNOSIS — R55 Syncope and collapse: Secondary | ICD-10-CM | POA: Diagnosis not present

## 2018-08-11 LAB — CUP PACEART REMOTE DEVICE CHECK
Implantable Pulse Generator Implant Date: 20180709
MDC IDC SESS DTM: 20200101043814

## 2018-08-12 DIAGNOSIS — H26491 Other secondary cataract, right eye: Secondary | ICD-10-CM | POA: Diagnosis not present

## 2018-08-12 DIAGNOSIS — H353131 Nonexudative age-related macular degeneration, bilateral, early dry stage: Secondary | ICD-10-CM | POA: Diagnosis not present

## 2018-08-12 NOTE — Progress Notes (Signed)
Carelink Summary Report / Loop Recorder 

## 2018-08-26 DIAGNOSIS — H26492 Other secondary cataract, left eye: Secondary | ICD-10-CM | POA: Diagnosis not present

## 2018-09-13 ENCOUNTER — Ambulatory Visit (INDEPENDENT_AMBULATORY_CARE_PROVIDER_SITE_OTHER): Payer: Medicare Other

## 2018-09-13 DIAGNOSIS — R55 Syncope and collapse: Secondary | ICD-10-CM | POA: Diagnosis not present

## 2018-09-16 LAB — CUP PACEART REMOTE DEVICE CHECK
Date Time Interrogation Session: 20200203091130
MDC IDC PG IMPLANT DT: 20180709

## 2018-09-22 NOTE — Progress Notes (Signed)
Carelink Summary Report / Loop Recorder 

## 2018-10-12 ENCOUNTER — Encounter (INDEPENDENT_AMBULATORY_CARE_PROVIDER_SITE_OTHER): Payer: Medicare Other | Admitting: Ophthalmology

## 2018-10-12 DIAGNOSIS — H35033 Hypertensive retinopathy, bilateral: Secondary | ICD-10-CM | POA: Diagnosis not present

## 2018-10-12 DIAGNOSIS — H43813 Vitreous degeneration, bilateral: Secondary | ICD-10-CM

## 2018-10-12 DIAGNOSIS — I10 Essential (primary) hypertension: Secondary | ICD-10-CM

## 2018-10-12 DIAGNOSIS — H353112 Nonexudative age-related macular degeneration, right eye, intermediate dry stage: Secondary | ICD-10-CM

## 2018-10-12 DIAGNOSIS — H353221 Exudative age-related macular degeneration, left eye, with active choroidal neovascularization: Secondary | ICD-10-CM

## 2018-10-18 ENCOUNTER — Ambulatory Visit (INDEPENDENT_AMBULATORY_CARE_PROVIDER_SITE_OTHER): Payer: Medicare Other | Admitting: *Deleted

## 2018-10-18 DIAGNOSIS — R55 Syncope and collapse: Secondary | ICD-10-CM | POA: Diagnosis not present

## 2018-10-19 LAB — CUP PACEART REMOTE DEVICE CHECK
Date Time Interrogation Session: 20200307090529
MDC IDC PG IMPLANT DT: 20180709

## 2018-10-26 NOTE — Progress Notes (Signed)
Carelink Summary Report / Loop Recorder 

## 2018-11-18 ENCOUNTER — Other Ambulatory Visit: Payer: Self-pay

## 2018-11-18 ENCOUNTER — Ambulatory Visit (INDEPENDENT_AMBULATORY_CARE_PROVIDER_SITE_OTHER): Payer: Medicare Other | Admitting: *Deleted

## 2018-11-18 DIAGNOSIS — R55 Syncope and collapse: Secondary | ICD-10-CM | POA: Diagnosis not present

## 2018-11-18 LAB — CUP PACEART REMOTE DEVICE CHECK
Date Time Interrogation Session: 20200409124036
Implantable Pulse Generator Implant Date: 20180709

## 2018-11-24 ENCOUNTER — Encounter: Payer: Self-pay | Admitting: Family

## 2018-11-24 ENCOUNTER — Other Ambulatory Visit: Payer: Self-pay

## 2018-11-24 ENCOUNTER — Ambulatory Visit (INDEPENDENT_AMBULATORY_CARE_PROVIDER_SITE_OTHER): Payer: Medicare Other | Admitting: Family

## 2018-11-24 VITALS — BP 136/80 | HR 70 | Temp 97.9°F | Ht 65.5 in | Wt 153.6 lb

## 2018-11-24 DIAGNOSIS — R220 Localized swelling, mass and lump, head: Secondary | ICD-10-CM

## 2018-11-24 MED ORDER — METHYLPREDNISOLONE 4 MG PO TBPK: ORAL_TABLET | ORAL | 0 refills | Status: DC

## 2018-11-24 MED ORDER — CEPHALEXIN 500 MG PO CAPS: 500.0000 mg | ORAL_CAPSULE | Freq: Three times a day (TID) | ORAL | 0 refills | Status: AC

## 2018-11-24 MED ORDER — METHYLPREDNISOLONE ACETATE 40 MG/ML IJ SUSP
40.0000 mg | Freq: Once | INTRAMUSCULAR | Status: AC
  Administered 2018-11-24: 40 mg via INTRAMUSCULAR

## 2018-11-24 NOTE — Patient Instructions (Signed)
Please also use Zyrtec 10 mg ( take at night) and Pepcid 20 mg twice a day for the allergic reaction; You can buy both of these over the counter.

## 2018-11-24 NOTE — Progress Notes (Signed)
KEYSI Kelley is a 78 y.o. female with the following history as recorded in EpicCare:  Patient Active Problem List   Diagnosis Date Noted  . Routine general medical examination at a health care facility 05/27/2018  . Essential (primary) hypertension 05/20/2017  . Syncope 02/09/2017  . DDD (degenerative disc disease), lumbar 01/16/2011  . RESTLESS LEG SYNDROME 06/07/2009  . Hyperglycemia 06/07/2009  . Hyperlipidemia 07/19/2007    Current Outpatient Medications  Medication Sig Dispense Refill  . docusate sodium (COLACE) 50 MG capsule Take 1 capsule (50 mg total) by mouth daily as needed for mild constipation or moderate constipation. 30 capsule 0  . hydrocortisone (ANUSOL-HC) 2.5 % rectal cream Place 1 application rectally 2 (two) times daily. 30 g 0  . losartan (COZAAR) 50 MG tablet Take 1 tablet (50 mg total) by mouth daily. 90 tablet 3  . pravastatin (PRAVACHOL) 20 MG tablet TAKE 1 TABLET(20 MG) BY MOUTH DAILY. Need updated lab work for more refills. 90 tablet 3  . ropinirole (REQUIP) 5 MG tablet Take 1 tablet (5 mg total) by mouth at bedtime. Must keep schedule appt w/new provider for future refills 90 tablet 3  . cephALEXin (KEFLEX) 500 MG capsule Take 1 capsule (500 mg total) by mouth 3 (three) times daily for 5 days. 15 capsule 0  . methylPREDNISolone (MEDROL DOSEPAK) 4 MG TBPK tablet Taper as directed 21 tablet 0   No current facility-administered medications for this visit.     Allergies: Sulfonamide derivatives  Past Medical History:  Diagnosis Date  . DDD (degenerative disc disease), lumbar   . Hyperlipidemia   . Osteopenia    [place] [facility]  . Syncope     Past Surgical History:  Procedure Laterality Date  . ABDOMINAL HYSTERECTOMY     For dysfunctional bleeding; no BSO  . BACK SURGERY  1970   ruptured disc ; Dr Roxan Hockey  . COLONOSCOPY  2001   Negative; done for rectal bleeding  . LOOP RECORDER INSERTION N/A 02/16/2017   Procedure: Loop Recorder Insertion;  Surgeon:  Thurmon Fair, MD;  Location: [facility];  Service: Cardiovascular;  Laterality: N/A;    Family History  Problem Relation Age of Onset  . Lung cancer Father        smoker  . Rheum arthritis Father   . Heart attack Mother        80s  . Stroke Mother        in 41s  . Breast cancer Maternal Aunt   . Diabetes Neg Hx     Social History   Tobacco Use  . Smoking status: Never Smoker  . Smokeless tobacco: Never Used  Substance Use Topics  . Alcohol use: Yes    Comment: Socially , 1-2 glasseswine / night    Subjective:  Patient is seen in the office with concerns for sudden onset of facial swelling/ crusting over left cheek. Symptoms started last night in the middle of the night; Denies any itching at the site; denies any pain or burning- has taken the Shingles vaccine; no vision changes; no difficulty closing left eye or moving left side of mouth/ able to swallow and close mouth completely; does feel that there is a localized area over left cheek where she questions if she was bitten by an insect. Has not taken any type of antihistamine or applied ice to her cheek.   Objective:  Vitals:   11/24/18 1100  BP: 136/80  Pulse: 70  Temp: 97.9 F (36.6 C)  TempSrc: Oral  SpO2: 97%  Weight: 153 lb 9.6 oz (69.7 kg)  Height: 5' 5.5" (1.664 m)    General: Well developed, well nourished, in no acute distress  Skin : Warm and dry. Localized area of redness noted over left cheek with some drainage noted; area c/w puncture wound is noted on cheek;  Head: Normocephalic and atraumatic  Eyes: Sclera and conjunctiva clear; pupils round and reactive to light; extraocular movements intact  Ears: External normal; canals clear; tympanic membranes normal  Oropharynx: Pink, supple. No suspicious lesions  Neck: Supple without thyromegaly, adenopathy  Lungs: Respirations unlabored;  Neurologic: Alert and oriented; speech intact; face symmetrical; moves all extremities well; CNII-XII intact  without focal deficit  Assessment:  1. Facial swelling     Plan:  Symptoms do not appear c/w Bells Palsy or shingles; more c/w insect bite or contact allergic reaction; will treat with Depo-Medrol IM 40 mg in office; she will start Medrol Dose pak tomorrow; will treat with Keflex 500 mg tid x 5 days as well; can also use Zyrtec and Pepcid for antihistamine benefit. Follow-up worse, no better.   No follow-ups on file.  No orders of the defined types were placed in this encounter.   Requested Prescriptions   Signed Prescriptions Disp Refills  . methylPREDNISolone (MEDROL DOSEPAK) 4 MG TBPK tablet 21 tablet 0    Sig: Taper as directed  . cephALEXin (KEFLEX) 500 MG capsule 15 capsule 0    Sig: Take 1 capsule (500 mg total) by mouth 3 (three) times daily for 5 days.

## 2018-11-29 NOTE — Progress Notes (Signed)
Carelink Summary Report / Loop Recorder 

## 2018-12-21 ENCOUNTER — Ambulatory Visit (INDEPENDENT_AMBULATORY_CARE_PROVIDER_SITE_OTHER): Payer: Medicare Other | Admitting: *Deleted

## 2018-12-21 ENCOUNTER — Other Ambulatory Visit: Payer: Self-pay

## 2018-12-21 DIAGNOSIS — R55 Syncope and collapse: Secondary | ICD-10-CM

## 2018-12-22 LAB — CUP PACEART REMOTE DEVICE CHECK
Date Time Interrogation Session: 20200512133644
Implantable Pulse Generator Implant Date: 20180709

## 2019-01-07 NOTE — Progress Notes (Signed)
Carelink Summary Report / Loop Recorder 

## 2019-01-24 ENCOUNTER — Ambulatory Visit (INDEPENDENT_AMBULATORY_CARE_PROVIDER_SITE_OTHER): Payer: Medicare Other

## 2019-01-24 DIAGNOSIS — R55 Syncope and collapse: Secondary | ICD-10-CM | POA: Diagnosis not present

## 2019-01-24 LAB — CUP PACEART REMOTE DEVICE CHECK
Date Time Interrogation Session: 20200614154036
Implantable Pulse Generator Implant Date: 20180709

## 2019-02-03 NOTE — Progress Notes (Signed)
Carelink Summary Report / Loop Recorder 

## 2019-02-24 DIAGNOSIS — Z961 Presence of intraocular lens: Secondary | ICD-10-CM | POA: Diagnosis not present

## 2019-02-24 DIAGNOSIS — H353131 Nonexudative age-related macular degeneration, bilateral, early dry stage: Secondary | ICD-10-CM | POA: Diagnosis not present

## 2019-02-25 ENCOUNTER — Ambulatory Visit (INDEPENDENT_AMBULATORY_CARE_PROVIDER_SITE_OTHER): Payer: Medicare Other | Admitting: *Deleted

## 2019-02-25 DIAGNOSIS — R55 Syncope and collapse: Secondary | ICD-10-CM

## 2019-02-25 LAB — CUP PACEART REMOTE DEVICE CHECK
Date Time Interrogation Session: 20200717161022
Implantable Pulse Generator Implant Date: 20180709

## 2019-03-02 NOTE — Progress Notes (Signed)
Carelink Summary Report / Loop Recorder 

## 2019-03-17 ENCOUNTER — Encounter: Payer: Self-pay | Admitting: Family

## 2019-03-31 ENCOUNTER — Ambulatory Visit (INDEPENDENT_AMBULATORY_CARE_PROVIDER_SITE_OTHER): Payer: Medicare Other | Admitting: *Deleted

## 2019-03-31 DIAGNOSIS — R55 Syncope and collapse: Secondary | ICD-10-CM | POA: Diagnosis not present

## 2019-03-31 LAB — CUP PACEART REMOTE DEVICE CHECK
Date Time Interrogation Session: 20200819161053
Implantable Pulse Generator Implant Date: 20180709

## 2019-04-08 NOTE — Progress Notes (Signed)
Carelink Summary Report / Loop Recorder 

## 2019-04-25 ENCOUNTER — Other Ambulatory Visit: Payer: Self-pay

## 2019-04-25 ENCOUNTER — Encounter (INDEPENDENT_AMBULATORY_CARE_PROVIDER_SITE_OTHER): Payer: Medicare Other | Admitting: Ophthalmology

## 2019-04-25 DIAGNOSIS — H35033 Hypertensive retinopathy, bilateral: Secondary | ICD-10-CM

## 2019-04-25 DIAGNOSIS — H353112 Nonexudative age-related macular degeneration, right eye, intermediate dry stage: Secondary | ICD-10-CM

## 2019-04-25 DIAGNOSIS — I10 Essential (primary) hypertension: Secondary | ICD-10-CM

## 2019-04-25 DIAGNOSIS — H353221 Exudative age-related macular degeneration, left eye, with active choroidal neovascularization: Secondary | ICD-10-CM | POA: Diagnosis not present

## 2019-04-25 DIAGNOSIS — H43813 Vitreous degeneration, bilateral: Secondary | ICD-10-CM

## 2019-05-02 ENCOUNTER — Ambulatory Visit (INDEPENDENT_AMBULATORY_CARE_PROVIDER_SITE_OTHER): Payer: Medicare Other | Admitting: *Deleted

## 2019-05-02 DIAGNOSIS — R55 Syncope and collapse: Secondary | ICD-10-CM

## 2019-05-03 LAB — CUP PACEART REMOTE DEVICE CHECK
Date Time Interrogation Session: 20200921164110
Implantable Pulse Generator Implant Date: 20180709

## 2019-05-10 NOTE — Progress Notes (Signed)
Carelink Summary Report / Loop Recorder 

## 2019-05-16 ENCOUNTER — Encounter: Payer: Self-pay | Admitting: Family Medicine

## 2019-05-16 ENCOUNTER — Other Ambulatory Visit: Payer: Self-pay

## 2019-05-16 ENCOUNTER — Ambulatory Visit (INDEPENDENT_AMBULATORY_CARE_PROVIDER_SITE_OTHER): Payer: Medicare Other | Admitting: Family Medicine

## 2019-05-16 VITALS — BP 120/74 | HR 82 | Temp 97.8°F | Resp 16 | Ht 65.5 in | Wt 154.2 lb

## 2019-05-16 DIAGNOSIS — M79604 Pain in right leg: Secondary | ICD-10-CM

## 2019-05-16 DIAGNOSIS — M79605 Pain in left leg: Secondary | ICD-10-CM

## 2019-05-16 DIAGNOSIS — Z23 Encounter for immunization: Secondary | ICD-10-CM | POA: Diagnosis not present

## 2019-05-16 DIAGNOSIS — I10 Essential (primary) hypertension: Secondary | ICD-10-CM

## 2019-05-16 DIAGNOSIS — R222 Localized swelling, mass and lump, trunk: Secondary | ICD-10-CM

## 2019-05-16 DIAGNOSIS — R739 Hyperglycemia, unspecified: Secondary | ICD-10-CM

## 2019-05-16 DIAGNOSIS — G2581 Restless legs syndrome: Secondary | ICD-10-CM

## 2019-05-16 DIAGNOSIS — E782 Mixed hyperlipidemia: Secondary | ICD-10-CM | POA: Diagnosis not present

## 2019-05-16 LAB — COMPREHENSIVE METABOLIC PANEL
ALT: 15 U/L (ref 0–35)
AST: 15 U/L (ref 0–37)
Albumin: 4.5 g/dL (ref 3.5–5.2)
Alkaline Phosphatase: 92 U/L (ref 39–117)
BUN: 16 mg/dL (ref 6–23)
CO2: 26 mEq/L (ref 19–32)
Calcium: 9.6 mg/dL (ref 8.4–10.5)
Chloride: 105 mEq/L (ref 96–112)
Creatinine, Ser: 0.76 mg/dL (ref 0.40–1.20)
GFR: 73.37 mL/min (ref >60.00)
Glucose, Bld: 97 mg/dL (ref 70–99)
Potassium: 4.6 mEq/L (ref 3.5–5.1)
Sodium: 139 mEq/L (ref 135–145)
Total Bilirubin: 0.5 mg/dL (ref 0.2–1.2)
Total Protein: 7 g/dL (ref 6.0–8.3)

## 2019-05-16 LAB — LIPID PANEL
Cholesterol: 138 mg/dL (ref 0–200)
HDL: 44.5 mg/dL (ref >39.00)
LDL Cholesterol: 65 mg/dL (ref 0–99)
NonHDL: 93.39
Total CHOL/HDL Ratio: 3
Triglycerides: 141 mg/dL (ref 0.0–149.0)
VLDL: 28.2 mg/dL (ref 0.0–40.0)

## 2019-05-16 LAB — HEMOGLOBIN A1C: Hgb A1c MFr Bld: 6 % (ref 4.6–6.5)

## 2019-05-16 MED ORDER — GABAPENTIN 100 MG PO CAPS: 300.0000 mg | ORAL_CAPSULE | Freq: Four times a day (QID) | ORAL | 0 refills | Status: DC

## 2019-05-16 MED ORDER — PREDNISONE 20 MG PO TABS: ORAL_TABLET | ORAL | 0 refills | Status: AC

## 2019-05-16 NOTE — Progress Notes (Signed)
HPI:   [name] is a 78 y.o. female, who is here today to establish care.  Former PCP: Alphonse Guild, NP Last preventive routine visit: 05/27/2018.  Chronic medical problems: Hypertension, hyperlipidemia, RLS, and back pain among some.  HLD: She is on Pravastatin 20 mg daily. She also follows a low fat diet.  Lab Results  Component Value Date   CHOL 143 05/31/2018   HDL 42.30 05/31/2018   LDLCALC 80 12/26/2016   LDLDIRECT 82.0 05/31/2018   TRIG 250.0 (H) 05/31/2018   CHOLHDL 3 05/31/2018   Hyperglycemia, and mildly elevated HgA1C. 05/2018 HgA1C was 5.8.   HTN: Dx'ed 2-3 years ago. Denies severe/frequent headache, visual changes, chest pain, dyspnea, palpitation, claudication, focal weakness, or edema.  She is on Losartan 50 mg daily.  Lab Results  Component Value Date   CREATININE 0.79 05/31/2018   BUN 20 05/31/2018   NA 141 05/31/2018   K 4.4 05/31/2018   CL 107 05/31/2018   CO2 24 05/31/2018    She follows with cardiology regularly, history of syncope x 1.  Concerns today: 2 months of RLE pain, from anterior aspect of hip down to feet. "Strange bad" pain. Problem getting worse. Reporting this as a new problem.  Problem is bilateral, R>L, intermittently but has pain daily. 8-9/10. Exacerbated by prolonged walking and standing. Pain is worse when she is in bed,she cannot turn because of pain.  She has taken Ibuprofen and Tylenol.  Distal RLE tingling sensation. She denies lower back pain,saddle anesthesia,or changes in bowel/urine function. Denies fever,chills,night sweats,or abnormal wt loss.  She had lumbar spine surgery about 40 years ago and has not had problems since then.  RLS: She is on Requip 5 mg and has taken for years (2008). She doe snot feel like medication is helping as well as it did before. Sometimes she takes 2-3 tabs at the time. Problem is exacerbated by prolonged sitting with extended legs and when in bed.  It interferes with sleep.  States that she cannot sit for long to watch a movie.  She denies LE edema or erythema.  Review of Systems  Constitutional: Negative for activity change, appetite change and fatigue.  HENT: Negative for mouth sores, nosebleeds, sore throat and trouble swallowing.   Eyes: Negative for redness and visual disturbance.  Respiratory: Negative for cough and wheezing.   Gastrointestinal: Negative for abdominal pain, nausea and vomiting.       Negative for changes in bowel habits.  Genitourinary: Negative for decreased urine volume, dysuria and hematuria.  Neurological: Negative for syncope, facial asymmetry and light-headedness.  Rest see pertinent positives and negatives per HPI.   Current Outpatient Medications on File Prior to Visit  Medication Sig Dispense Refill  . losartan (COZAAR) 50 MG tablet Take 1 tablet (50 mg total) by mouth daily. 90 tablet 3  . pravastatin (PRAVACHOL) 20 MG tablet TAKE 1 TABLET(20 MG) BY MOUTH DAILY. Need updated lab work for more refills. 90 tablet 3  . ropinirole (REQUIP) 5 MG tablet Take 1 tablet (5 mg total) by mouth at bedtime. Must keep schedule appt w/new provider for future refills 90 tablet 3   No current facility-administered medications on file prior to visit.      Past Medical History:  Diagnosis Date  . DDD (degenerative disc disease), lumbar   . Hyperlipidemia   . Osteopenia    [place] [facility]  . Syncope    Allergies  Allergen Reactions  . Sulfonamide Derivatives  Rash    REACTION: rash as child    Family History  Problem Relation Age of Onset  . Lung cancer Father        smoker  . Rheum arthritis Father   . Heart attack Mother        33s  . Stroke Mother        in 27s  . Breast cancer Maternal Aunt   . Diabetes Neg Hx     Social History   Socioeconomic History  . Marital status: Married    Spouse name: Not on file  . Number of children: Not on file  . Years of education: Not on file  . Highest  education level: Not on file  Occupational History  . Occupation: Retired Barrister's clerk, trained in Theme park manager therapy  Social Needs  . Financial resource strain: Not on file  . Food insecurity    Worry: Not on file    Inability: Not on file  . Transportation needs    Medical: Not on file    Non-medical: Not on file  Tobacco Use  . Smoking status: Never Smoker  . Smokeless tobacco: Never Used  Substance and Sexual Activity  . Alcohol use: Yes    Comment: Socially , 1-2 glasseswine / night  . Drug use: No  . Sexual activity: Not on file  Lifestyle  . Physical activity    Days per week: Not on file    Minutes per session: Not on file  . Stress: Not on file  Relationships  . Social Herbalist on phone: Not on file    Gets together: Not on file    Attends religious service: Not on file    Active member of club or organization: Not on file    Attends meetings of clubs or organizations: Not on file    Relationship status: Not on file  Other Topics Concern  . Not on file  Social History Narrative   Lives w/ Husband in [place]:   05/16/19 0932  BP: 120/74  Pulse: 82  Resp: 16  Temp: 97.8 F (36.6 C)  SpO2: 97%    Body mass index is 25.28 kg/m.  Physical Exam  Nursing note and vitals reviewed. Constitutional: She is oriented to person, place, and time. She appears well-developed and well-nourished. No distress.  HENT:  Head: Normocephalic and atraumatic.  Mouth/Throat: Oropharynx is clear and moist and mucous membranes are normal.  Eyes: Pupils are equal, round, and reactive to light. Conjunctivae are normal.  Cardiovascular: Normal rate and regular rhythm.  No murmur heard. Pulses:      Dorsalis pedis pulses are 2+ on the right side and 2+ on the left side.  Respiratory: Effort normal and breath sounds normal. No respiratory distress.  GI: Soft. She exhibits no mass. There is no hepatomegaly. There is no abdominal tenderness.   Musculoskeletal:        General: No edema.     Right hip: She exhibits decreased range of motion (internal rotation L>R and external rotation.).     Lumbar back: She exhibits no tenderness and no bony tenderness.     Comments: Right hip and LE pain elicited with extension and internal rotation.  Lymphadenopathy:    She has no cervical adenopathy.       Right: Supraclavicular (1 cm nodular lesion palpated upon deep palpation.) adenopathy present.  Neurological: She is alert and oriented to person, place, and time. She has normal  strength. No cranial nerve deficit. Gait normal.  Skin: Skin is warm. No rash noted. No erythema.  Psychiatric: She has a normal mood and affect.  Well groomed, good eye contact.   ASSESSMENT AND PLAN:  [name] was seen today for transfer of care and leg pain.  Diagnoses and all orders for this visit:  Lab Results  Component Value Date   CHOL 138 05/16/2019   HDL 44.50 05/16/2019   LDLCALC 65 05/16/2019   LDLDIRECT 82.0 05/31/2018   TRIG 141.0 05/16/2019   CHOLHDL 3 05/16/2019   Lab Results  Component Value Date   CREATININE 0.76 05/16/2019   BUN 16 05/16/2019   NA 139 05/16/2019   K 4.6 05/16/2019   CL 105 05/16/2019   CO2 26 05/16/2019   Lab Results  Component Value Date   HGBA1C 6.0 05/16/2019   Lab Results  Component Value Date   ALT 15 05/16/2019   AST 15 05/16/2019   ALKPHOS 92 05/16/2019   BILITOT 0.5 05/16/2019    Pain in both lower extremities We discussed possible etiologies. ? Hip OA, ? Radicular pain among some. After discussion of some side effects she agrees with trying Prednisone taper. Gabapentin may also help.  Need for influenza vaccination -     Flu Vaccine QUAD High Dose(Fluad)  Essential (primary) hypertension BP adequately controlled. Continue Losartan 50 mg daily. Recommend monitoring BP regularly. Low salt diet.  -     Comprehensive metabolic panel  Mixed hyperlipidemia No changes in current  management, will follow FLP done today and will give further recommendations accordingly. -     Comprehensive metabolic panel -     Lipid panel  Hyperglycemia Healthy life style for primary prevention.  -     Hemoglobin A1c  RESTLESS LEG SYNDROME Problem is not well controlled. She agrees with trying Gabapentin, start 100 mg at bedtime and titrate to 300 mg as tolerated. Some side effects discussed. Continue Requip 5 mg for now.  Fullness of supraclavicular fossa ? Lymphadenopathy. US will be arranged.  -     US SOFT TISSUE HEAD & NECK (NON-THYROID); Future  Other orders -     predniSONE (DELTASONE) 20 MG tablet; 3 tabs for 3 days, 2 tabs for 3 days, 1 tabs for 3 days, and 1/2 tab for 3 days. Take tables together with breakfast. -     gabapentin (NEURONTIN) 100 MG capsule; Take 3 capsules (300 mg total) by mouth 4 (four) times daily.   Return in about 2 months (around 07/16/2019) for RLS,LE pain.     Alayza Pieper G. [place], MD  [facility]. [place] office.

## 2019-05-16 NOTE — Patient Instructions (Addendum)
A few things to remember from today's visit:   Pain in both lower extremities  Need for influenza vaccination - Plan: Flu Vaccine QUAD High Dose(Fluad)  Essential (primary) hypertension - Plan: Comprehensive metabolic panel  Mixed hyperlipidemia - Plan: Comprehensive metabolic panel, Lipid panel  Hyperglycemia - Plan: Hemoglobin A1c  RESTLESS LEG SYNDROME  Fullness of supraclavicular fossa - Plan: US SOFT TISSUE HEAD & NECK (NON-THYROID)  Today we are starting gabapentin 100 mg, in 7 to 10 days you can increase dose to 2 capsules at bedtime and then in 7 to 10 days you can increase it to 3 capsules at bedtime for a total dose of 300 mg.  Prednisone yesterday with food, avoid ibuprofen.  If you can, you could split Requip in 1/2 tablet after starting gabapentin.  Extremity pain and tingling should be improved in 4 to 6 weeks, if not we will need lumbar MRI and/or orthopedic evaluation.  Please be sure medication list is accurate. If a new problem present, please set up appointment sooner than planned today.

## 2019-05-17 ENCOUNTER — Telehealth: Payer: Self-pay | Admitting: Family Medicine

## 2019-05-17 MED ORDER — GABAPENTIN 100 MG PO CAPS: 300.0000 mg | ORAL_CAPSULE | Freq: Every day | ORAL | 0 refills | Status: DC

## 2019-05-17 NOTE — Telephone Encounter (Signed)
I wrote instructions on her AVS. I did click on qid instead QHS when I sent Rx. Please advise to take Gabapentin as we discussed.  Thanks, BJ

## 2019-05-17 NOTE — Telephone Encounter (Signed)
Pt states the instructions on her AVS for the  gabapentin (NEURONTIN) 100 MG capsule are not the same that is on the bottle. Pt needs a call back to clarify.   Today we are starting gabapentin 100 mg, in 7 to 10 days you can increase dose to 2 capsules at bedtime and then in 7 to 10 days you can increase it to 3 capsules at bedtime for a total dose of 300 mg.  Instructions on bottle say  Take 3 capsules (300 mg total) by mouth 4 (four) times daily Also : Does Dr Martinique want the increase just at night or in the daytime too. Please advise.

## 2019-05-17 NOTE — Telephone Encounter (Signed)
Please advise 

## 2019-05-17 NOTE — Telephone Encounter (Signed)
Spoke with patient and clarified medication instructions per Dr. Martinique. Patient verbalized understanding.

## 2019-05-19 ENCOUNTER — Encounter: Payer: Self-pay | Admitting: Family Medicine

## 2019-05-19 MED ORDER — LOSARTAN POTASSIUM 50 MG PO TABS: 50.0000 mg | ORAL_TABLET | Freq: Every day | ORAL | 2 refills | Status: DC

## 2019-05-19 MED ORDER — ROPINIROLE HCL 5 MG PO TABS: 5.0000 mg | ORAL_TABLET | Freq: Every day | ORAL | 1 refills | Status: DC

## 2019-05-19 MED ORDER — PRAVASTATIN SODIUM 20 MG PO TABS: ORAL_TABLET | ORAL | 3 refills | Status: DC

## 2019-05-26 ENCOUNTER — Ambulatory Visit
Admission: RE | Admit: 2019-05-26 | Discharge: 2019-05-26 | Disposition: A | Discharge status: Home / Self Care | Payer: Medicare Other | Source: Ambulatory Visit | Attending: Family Medicine | Admitting: Family Medicine

## 2019-05-26 DIAGNOSIS — R2231 Localized swelling, mass and lump, right upper limb: Secondary | ICD-10-CM | POA: Diagnosis not present

## 2019-05-26 DIAGNOSIS — R222 Localized swelling, mass and lump, trunk: Secondary | ICD-10-CM

## 2019-05-27 ENCOUNTER — Encounter: Payer: Self-pay | Admitting: Family Medicine

## 2019-06-02 ENCOUNTER — Telehealth: Payer: Self-pay | Admitting: Family Medicine

## 2019-06-02 NOTE — Telephone Encounter (Signed)
As long as she was taking predniSONE (DELTASONE) 20 MG tablet   she was doing okay but since she hasn't been taking that her leg is bothering her again.  Please advise

## 2019-06-05 LAB — CUP PACEART REMOTE DEVICE CHECK
Date Time Interrogation Session: 20201024164113
Implantable Pulse Generator Implant Date: 20180709

## 2019-06-06 ENCOUNTER — Ambulatory Visit (INDEPENDENT_AMBULATORY_CARE_PROVIDER_SITE_OTHER): Payer: Medicare Other | Admitting: *Deleted

## 2019-06-06 DIAGNOSIS — R55 Syncope and collapse: Secondary | ICD-10-CM

## 2019-06-06 NOTE — Telephone Encounter (Signed)
Please advise 

## 2019-06-06 NOTE — Telephone Encounter (Signed)
This is not a medication that can be taken as needed. If still having problem with legs we need to re-evaluate and consider other options.  Thanks, BJ

## 2019-06-07 NOTE — Telephone Encounter (Signed)
Spoke with pt ok for an office visit scheduled on 06/13/2019 at 10 am

## 2019-06-13 ENCOUNTER — Other Ambulatory Visit: Payer: Self-pay

## 2019-06-13 ENCOUNTER — Ambulatory Visit (INDEPENDENT_AMBULATORY_CARE_PROVIDER_SITE_OTHER): Payer: Medicare Other | Admitting: Family Medicine

## 2019-06-13 ENCOUNTER — Encounter: Payer: Self-pay | Admitting: Family Medicine

## 2019-06-13 VITALS — BP 110/64 | HR 76 | Temp 97.8°F | Resp 16 | Ht 65.5 in | Wt 150.6 lb

## 2019-06-13 DIAGNOSIS — R21 Rash and other nonspecific skin eruption: Secondary | ICD-10-CM

## 2019-06-13 DIAGNOSIS — M5441 Lumbago with sciatica, right side: Secondary | ICD-10-CM | POA: Diagnosis not present

## 2019-06-13 MED ORDER — TRIAMCINOLONE ACETONIDE 0.1 % EX CREA: 1.0000 "application " | TOPICAL_CREAM | Freq: Two times a day (BID) | CUTANEOUS | 0 refills | Status: AC

## 2019-06-13 NOTE — Progress Notes (Signed)
ACUTE VISIT   HPI:  Chief Complaint  Patient presents with  . Follow-up    leg pain  . Rash    L side of face and neck    Andrea Kelley is a 78 y.o. female, who is here today complaining of persistent LE pain, R>L, which she has had for about 2-3 months, 9/10, intermittent, 1-2 hours at the time. RLE tingling sensation,mild. Having mild right-sided lower back pain. Negative for LE rash or edema.  Prednisone was recommended, it helped but pain re-started after discontinuation of prednisone. Exacerbated by walking and standing, alleviated by rest. Negative for saddle anesthesia or bowel/urine dysfunction.  Gabapentin 300 mg at bedtime has not helped.  -Sunday she woke up with red "splash" on left cheek. Constant left cheek tightness sensation. + Pruritus. No new medication,out door exposure,facial product,or soap. It seems to be spreading to her neck.  He has not had this problem before. She is not sure about exacerbating or alleviating factors.  Denies associated fever,chills,oral lesions,sore throat,stridor,cough,wheezing,or cough.  She tried OTC hydrocortisone last night and it did not help.   Review of Systems  Constitutional: Negative for appetite change and fatigue.  HENT: Negative for congestion, ear pain, sinus pain and voice change.   Eyes: Negative for discharge and redness.  Cardiovascular: Negative for chest pain, palpitations and leg swelling.  Gastrointestinal: Negative for abdominal pain, nausea and vomiting.       No changes in bowel habits.  Musculoskeletal: Negative for gait problem and joint swelling.  Skin: Positive for rash.  Allergic/Immunologic: Negative for environmental allergies.  Neurological: Negative for syncope, weakness and headaches.  Rest see pertinent positives and negatives per HPI.   Current Outpatient Medications on File Prior to Visit  Medication Sig Dispense Refill  . losartan (COZAAR) 50 MG tablet Take 1  tablet (50 mg total) by mouth daily. 90 tablet 2  . pravastatin (PRAVACHOL) 20 MG tablet TAKE 1 TABLET(20 MG) BY MOUTH DAILY. Need updated lab work for more refills. 90 tablet 3  . ropinirole (REQUIP) 5 MG tablet Take 1 tablet (5 mg total) by mouth at bedtime. Must keep schedule appt w/new provider for future refills 90 tablet 1   No current facility-administered medications on file prior to visit.      Past Medical History:  Diagnosis Date  . DDD (degenerative disc disease), lumbar   . Hyperlipidemia   . Osteopenia    [place] [facility]  . Syncope    Allergies  Allergen Reactions  . Sulfonamide Derivatives Rash    REACTION: rash as child    Social History   Socioeconomic History  . Marital status: Married    Spouse name: Not on file  . Number of children: Not on file  . Years of education: Not on file  . Highest education level: Not on file  Occupational History  . Occupation: Retired Landscape architecturniture Industry, trained in Facilities managerspeech therapy  Social Needs  . Financial resource strain: Not on file  . Food insecurity    Worry: Not on file    Inability: Not on file  . Transportation needs    Medical: Not on file    Non-medical: Not on file  Tobacco Use  . Smoking status: Never Smoker  . Smokeless tobacco: Never Used  Substance and Sexual Activity  . Alcohol use: Yes    Comment: Socially , 1-2 glasseswine / night  . Drug use: No  . Sexual activity: Not on file  Lifestyle  .  Physical activity    Days per week: Not on file    Minutes per session: Not on file  . Stress: Not on file  Relationships  . Social Herbalist on phone: Not on file    Gets together: Not on file    Attends religious service: Not on file    Active member of club or organization: Not on file    Attends meetings of clubs or organizations: Not on file    Relationship status: Not on file  Other Topics Concern  . Not on file  Social History Narrative   Lives w/ Husband in [place]:    06/13/19 1007  BP: 110/64  Pulse: 76  Resp: 16  Temp: 97.8 F (36.6 C)  SpO2: 97%   Body mass index is 24.68 kg/m.   Physical Exam  Nursing note and vitals reviewed. Constitutional: She is oriented to person, place, and time. She appears well-developed and well-nourished. No distress.  HENT:  Head: Normocephalic and atraumatic.  Mouth/Throat: Oropharynx is clear and moist and mucous membranes are normal.  Eyes: Pupils are equal, round, and reactive to light. Conjunctivae and EOM are normal.  Cardiovascular: Normal rate and regular rhythm.  No murmur heard. Pulses:      Dorsalis pedis pulses are 2+ on the right side and 2+ on the left side.  Respiratory: Effort normal and breath sounds normal. No respiratory distress.  GI: Soft. She exhibits no mass. There is no hepatomegaly. There is no abdominal tenderness.  Musculoskeletal:        General: No edema.     Right hip: She exhibits decreased range of motion.     Lumbar back: She exhibits no tenderness and no bony tenderness.     Comments: Right lower back pain radiated to RLE when she was sitting up on examination table.    Lymphadenopathy:       Head (right side): No preauricular and no posterior auricular adenopathy present.       Head (left side): No preauricular and no posterior auricular adenopathy present.    She has no cervical adenopathy.  Neurological: She is alert and oriented to person, place, and time. She has normal strength. No cranial nerve deficit. Gait normal.  SLR negative bilateral.  Skin: Skin is warm. Rash noted. Rash is macular. No erythema.  Macular mildly raised erythematous rash on left cheek, 5-6 cm. 2-3 on right cheek. It is not tender or indurated. + Local heat.   Psychiatric: Her mood appears anxious.  Well groomed, good eye contact.    ASSESSMENT AND PLAN:  Andrea Kelley was seen today for follow-up and rash.  Diagnoses and all orders for this visit:  Orders Placed This Encounter  Procedures   . Ambulatory referral to Orthopedic Surgery    Facial rash We discussed possible etiologies. ? Urticaria. Because she just completed oral Prednisone, we will try topical steroid. Side effects discussed. OTC Cetirizine 10 mg bid for 7-10 days then once daily. Instructed about warning signs.  -     triamcinolone cream (KENALOG) 0.1 %; Apply 1 application topically 2 (two) times daily for 14 days.  Right-sided low back pain with sciatica, sciatica laterality unspecified, unspecified chronicity She has a heart monitor,mmplantable loop recorder, so I will hold on lumbar MRI for now.Will let ortho to decide about best Dx imaging for her. Gabapentin did not help , so discontinue.  Instructed about warning sings.   Return if symptoms worsen  or fail to improve, for Depending on clinical changes..   Yani Coventry G. [place], MD  [facility]. [place] office.

## 2019-06-13 NOTE — Patient Instructions (Signed)
A few things to remember from today's visit:   Facial rash - Plan: triamcinolone cream (KENALOG) 0.1 %  Right-sided low back pain with sciatica, sciatica laterality unspecified, unspecified chronicity - Plan: Ambulatory referral to Orthopedic Surgery Topical Triamcinolone 2 times daily for 14 days.  Over the counter Cetirizine 10 mg 2 times daily for 7-10 days then once daily.  Monitor for pain,fever,mouth swelling.  Please be sure medication list is accurate. If a new problem present, please set up appointment sooner than planned today.

## 2019-06-17 ENCOUNTER — Other Ambulatory Visit: Payer: Self-pay

## 2019-06-17 ENCOUNTER — Ambulatory Visit: Payer: Medicare Other | Admitting: Family Medicine

## 2019-06-17 ENCOUNTER — Encounter: Payer: Self-pay | Admitting: Family Medicine

## 2019-06-17 DIAGNOSIS — M5441 Lumbago with sciatica, right side: Secondary | ICD-10-CM

## 2019-06-17 DIAGNOSIS — M25551 Pain in right hip: Secondary | ICD-10-CM | POA: Diagnosis not present

## 2019-06-17 DIAGNOSIS — G8929 Other chronic pain: Secondary | ICD-10-CM | POA: Diagnosis not present

## 2019-06-17 MED ORDER — BACLOFEN 10 MG PO TABS: 5.0000 mg | ORAL_TABLET | Freq: Every evening | ORAL | 3 refills | Status: DC | PRN

## 2019-06-17 MED ORDER — NABUMETONE 500 MG PO TABS: 500.0000 mg | ORAL_TABLET | Freq: Two times a day (BID) | ORAL | 3 refills | Status: DC | PRN

## 2019-06-17 NOTE — Progress Notes (Signed)
Office Visit Note   Patient: Andrea Kelley           Date of Birth: [birth year]           MRN: [id number] Visit Date: 06/17/2019 Requested by: [place], Betty G, MD [address] [place],  [place] [address] PCP: [place], Betty G, MD  Subjective: Chief Complaint  Patient presents with  . Lower Back - Pain    Pain is mostly in the right posterior hip/buttock region, with radiating pain down the right leg when she stands. Has had this off & on since 6/20. NKI. Occasional pain down the left leg. Hurts to lie down (in any position) sometimes.    HPI: She is here with right posterior hip and leg pain.  Symptoms started in June, no definite injury.  She started noticing pain on the posterior lateral hip whenever standing for a long time.  Pain occasionally radiates down the leg.  Denies any bowel or bladder dysfunction, has not noticed any weakness or numbness.  Her PCP gave her prednisone which helped quite a bit while she was on it, but wore off fairly quickly.  In 2008 she had slightly different symptoms and an MRI scan showed severe spinal stenosis at L4-5, multifactorial along with disc bulging at L5-S1 with scarring around the S1 nerve roots related to a prior discectomy.  She did not require additional surgery after 2008, eventually she became pain-free and has done well until now.  Denies any groin pain.              ROS: No fevers or chills.  All other systems were reviewed and are negative.  Objective: Vital Signs: There were no vitals taken for this visit.  Physical Exam:  General:  Alert and oriented, in no acute distress. Pulm:  Breathing unlabored. Psy:  Normal mood, congruent affect. Skin: No rash. Low back: Well-healed midline L5-S1 surgical scar.  Minimal tenderness to palpation of the lumbar spinous processes.  She does have pain in the right sciatic notch.  On the posterior aspect of the greater trochanter.  Straight leg raise negative, lower extremity strength and  reflexes are normal.  She has good range of motion of her hip with no pain on internal rotation.  Imaging: None today.  Assessment & Plan: 1.  Low back and right leg pain, suspicious for recurrent disc protrusion.  Cannot rule out greater trochanter syndrome. -We will try Relafen for inflammation, baclofen for spasm and physical therapy.  After 2 or 3 weeks if not improving, she will contact me and I will order x-rays lumbar spine and right hip as well as MRI lumbar spine.     Procedures: No procedures performed  No notes on file     PMFS History: Patient Active Problem List   Diagnosis Date Noted  . Routine general medical examination at a health care facility 05/27/2018  . Essential (primary) hypertension 05/20/2017  . Syncope 02/09/2017  . DDD (degenerative disc disease), lumbar 01/16/2011  . RESTLESS LEG SYNDROME 06/07/2009  . Hyperglycemia 06/07/2009  . Hyperlipidemia 07/19/2007   Past Medical History:  Diagnosis Date  . DDD (degenerative disc disease), lumbar   . Hyperlipidemia   . Osteopenia     [place]  . Syncope     Family History  Problem Relation Age of Onset  . Lung cancer Father        smoker  . Rheum arthritis Father   . Heart attack Mother  93s  . Stroke Mother        in 59s  . Breast cancer Maternal Aunt   . Diabetes Neg Hx     Past Surgical History:  Procedure Laterality Date  . ABDOMINAL HYSTERECTOMY     For dysfunctional bleeding; no BSO  . BACK SURGERY  1970   ruptured disc ; Dr Quentin Cornwall  . COLONOSCOPY  2001   Negative; done for rectal bleeding  . LOOP RECORDER INSERTION N/A 02/16/2017   Procedure: Loop Recorder Insertion;  Surgeon: Sanda Klein, MD;  Location: [facility];  Service: Cardiovascular;  Laterality: N/A;   Social History   Occupational History  . 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 Smoking status: Never Smoker  . Smokeless tobacco: Never Used  Substance  and Sexual Activity  . Alcohol use: Yes    Comment: Socially , 1-2 glasseswine / night  . Drug use: No  . Sexual activity: Not on file

## 2019-06-23 ENCOUNTER — Encounter: Payer: Self-pay | Admitting: Physical Therapy

## 2019-06-23 ENCOUNTER — Other Ambulatory Visit: Payer: Self-pay

## 2019-06-23 ENCOUNTER — Ambulatory Visit: Payer: Medicare Other | Admitting: Physical Therapy

## 2019-06-23 DIAGNOSIS — M25551 Pain in right hip: Secondary | ICD-10-CM | POA: Diagnosis not present

## 2019-06-23 DIAGNOSIS — R29898 Other symptoms and signs involving the musculoskeletal system: Secondary | ICD-10-CM | POA: Diagnosis not present

## 2019-06-23 DIAGNOSIS — M6281 Muscle weakness (generalized): Secondary | ICD-10-CM | POA: Diagnosis not present

## 2019-06-23 DIAGNOSIS — R252 Cramp and spasm: Secondary | ICD-10-CM

## 2019-06-23 NOTE — Therapy (Signed)
[facility] [address] [place], [place], [address] Phone: [phone]   Fax:  [phone]  Physical Therapy Evaluation  Patient Details  Name: Andrea Kelley MRN: [id number] Date of Birth: February 10, 1941 Referring Provider (PT): Eunice Blase, MD   Encounter Date: 06/23/2019  PT End of Session - 06/23/19 1020    Visit Number  1    Number of Visits  12    Date for PT Re-Evaluation  08/04/19    PT Start Time  0930    PT Stop Time  1008    PT Time Calculation (min)  38 min    Activity Tolerance  Patient tolerated treatment well    Behavior During Therapy  [facility] for tasks assessed/performed       Past Medical History:  Diagnosis Date  . DDD (degenerative disc disease), lumbar   . Hyperlipidemia   . Osteopenia    [place]  . Syncope     Past Surgical History:  Procedure Laterality Date  . ABDOMINAL HYSTERECTOMY     For dysfunctional bleeding; no BSO  . BACK SURGERY  1970   ruptured disc ; Dr Quentin Cornwall  . COLONOSCOPY  2001   Negative; done for rectal bleeding  . LOOP RECORDER INSERTION N/A 02/16/2017   Procedure: Loop Recorder Insertion;  Surgeon: Sanda Klein, MD;  Location: [facility];  Service: Cardiovascular;  Laterality: N/A;    There were no vitals filed for this visit.   Subjective Assessment - 06/23/19 0934    Subjective  Pt is a 78 y/o female who presents to OPPT for Rt hip pain with radiates down lower extremity, which began in July 2020 without known injury.  Pt reports occasional symptoms bil, and worse with lying down and in mornings.  Pain appears to improve with activity during the day.    Limitations  Standing;Walking    How long can you stand comfortably?  no more than 10 min    How long can you walk comfortably?  3/4 mile (unable recently due to pain)    Patient Stated Goals  improve pain    Currently in Pain?  Yes    Pain Score  0-No pain   up to 7/10   Pain Location  Hip    Pain Orientation   Right    Pain Descriptors / Indicators  Sharp    Pain Type  Acute pain    Pain Radiating Towards  RLE to foot    Pain Onset  More than a month ago    Pain Frequency  Intermittent    Aggravating Factors   standing, walking, lying flat or on Rt side    Pain Relieving Factors  rest, gradual activity    Effect of Pain on Daily Activities  difficulty with getting to sleep         [facility] PT Assessment - 06/23/19 0939      Assessment   Medical Diagnosis  History of lumbar stenosis; right greater trochanter syndrome    Referring Provider (PT)  Eunice Blase, MD    Onset Date/Surgical Date  --   July 2020   Hand Dominance  Right    Next MD Visit  PRN    Prior Therapy  unrelated to this condition      Precautions   Precautions  None      Restrictions   Weight Bearing Restrictions  No      Balance Screen   Has the patient fallen in the past  6 months  No    Has the patient had a decrease in activity level because of a fear of falling?   No    Is the patient reluctant to leave their home because of a fear of falling?   No      Home Environment   Living Environment  Private residence    Living Arrangements  Alone   dog   Type of Home  House    Home Access  Stairs to enter    Entrance Stairs-Number of Steps  4    Additional Comments  denies difficulty      Prior Function   Level of Independence  Independent    Vocation  Retired    Leisure  reading, puzzles, no regular exercise (going to gym prior to [employer])      Cognition   Overall Cognitive Status  Within Functional Limits for tasks assessed      Posture/Postural Control   Posture/Postural Control  Postural limitations    Postural Limitations  Rounded Shoulders;Forward head      ROM / Strength   AROM / PROM / Strength  AROM;Strength      AROM   AROM Assessment Site  Lumbar    Lumbar Flexion  limited 10-15%     Lumbar Extension  WNL with concordant pain    Lumbar - Right Side Bend  WNL    Lumbar - Left Side Bend  WNL       Strength   Strength Assessment Site  Hip;Knee;Ankle    Right/Left Hip  Right;Left    Right Hip Flexion  4/5    Right Hip ABduction  3/5    Left Hip Flexion  4/5    Left Hip ABduction  3/5    Right/Left Knee  Right;Left    Right Knee Extension  5/5    Left Knee Extension  5/5    Right/Left Ankle  Right;Left    Right Ankle Dorsiflexion  5/5    Left Ankle Dorsiflexion  5/5      Flexibility   Soft Tissue Assessment /Muscle Length  yes    Hamstrings  tightness bil    Piriformis  tightness bilaterally R > L      Palpation   Palpation comment  TTP R greater trochanter, piriformis; palpable trigger points R gluteals      Special Tests    Special Tests  Lumbar    Other special tests  (-) SLR bilaterally                 Objective measurements completed on examination: See above findings.      [facility] Adult PT Treatment/Exercise - 06/23/19 0939      Self-Care   Self-Care  Other Self-Care Comments    Other Self-Care Comments   educated in use of tennis ball for self massage and mobilization      Exercises   Exercises  Knee/Hip      Lumbar Exercises: Stretches   Passive Hamstring Stretch  Right;1 rep;30 seconds   seated and supine   Single Knee to Chest Stretch Limitations  instructed for home    Double Knee to Chest Stretch Limitations  instructed for home    Piriformis Stretch  Right;Left;1 rep;30 seconds   seated and supine            PT Education - 06/23/19 1020    Education Details  HEP, DN    Person(s) Educated  Patient    Methods  Explanation;Demonstration;Handout    Comprehension  Verbalized understanding;Returned demonstration;Need further instruction          PT Long Term Goals - 06/23/19 1027      PT LONG TERM GOAL #1   Title  independent with HEP    Status  New    Target Date  08/04/19      PT LONG TERM GOAL #2   Title  report ability to walk dog for at least 3/4 mile without increase in pain for improved function    Status  New     Target Date  08/04/19      PT LONG TERM GOAL #3   Title  report ability to stand > 15 min without increase in pain for improved function    Status  New    Target Date  08/04/19      PT LONG TERM GOAL #4   Title  demonstrate at least 4/5 bil hip abduction strength for improved function and decreased risk of reinjury    Status  New    Target Date  08/04/19             Plan - 06/23/19 1020    Clinical Impression Statement  Pt is a 78 y/o female who presents to OPPT for Rt sided hip pain, radiating into RLE.  Pt with negative SLR and no significant indication for disc involvement today, but pt pain free today so difficulty formally assessing.  Pt does have reproduction of pain with palpation along greater trochanter bursa as well as active trigger points in pirifiormis and glutes, which may be more consistent with cause of pain.  Will benefit from PT to address deficits listed.    Personal Factors and Comorbidities  Past/Current Experience;Comorbidity 2    Comorbidities  osteopenia, DDD    Examination-Activity Limitations  Stand;Locomotion Level;Bed Mobility    Examination-Participation Restrictions  Cleaning    Stability/Clinical Decision Making  Evolving/Moderate complexity    Clinical Decision Making  Moderate    Rehab Potential  Good    PT Frequency  2x / week    PT Duration  6 weeks    PT Treatment/Interventions  ADLs/Self Care Home Management;Cryotherapy;Electrical Stimulation;Ultrasound;Traction;Moist Heat;Iontophoresis 4mg /ml Dexamethasone;Gait training;Stair training;Therapeutic activities;Therapeutic exercise;Patient/family education;Manual techniques;Neuromuscular re-education;Taping;Dry needling    PT Next Visit Plan  review HEP, DN to glutes/piriformis, ionto if pt interested, begin strengthening exercises    PT Home Exercise Plan  Access Code: [email or web address]    Consulted and Agree with Plan of Care  Patient       Patient will benefit from skilled therapeutic intervention  in order to improve the following deficits and impairments:  Increased fascial restricitons, Increased muscle spasms, Pain, Postural dysfunction, Decreased strength, Decreased range of motion, Impaired flexibility, Decreased mobility  Visit Diagnosis: Pain in right hip - Plan: PT plan of care cert/re-cert  Muscle weakness (generalized) - Plan: PT plan of care cert/re-cert  Cramp and spasm - Plan: PT plan of care cert/re-cert  Other symptoms and signs involving the musculoskeletal system - Plan: PT plan of care cert/re-cert     Problem List Patient Active Problem List   Diagnosis Date Noted  . Routine general medical examination at a health care facility 05/27/2018  . Essential (primary) hypertension 05/20/2017  . Syncope 02/09/2017  . DDD (degenerative disc disease), lumbar 01/16/2011  . RESTLESS LEG SYNDROME 06/07/2009  . Hyperglycemia 06/07/2009  . Hyperlipidemia 07/19/2007        14/03/2007, PT, DPT 06/23/19 10:30 AM  [facility] [address] [place], [place], [address] Phone: [phone]   Fax:  [phone]  Name: Andrea Kelley MRN: [id number] Date of Birth: [birth year]

## 2019-06-23 NOTE — Patient Instructions (Signed)
Access Code: [email or web address]  URL: [email or web address]  Date: 06/23/2019  Prepared by: Faustino Congress   Exercises  Supine Piriformis Stretch with Foot on Ground - 3 reps - 1 sets - 30 sec hold - 2x daily - 7x weekly  Seated Piriformis Stretch - 3 reps - 1 sets - 30 sec hold - 2x daily - 7x weekly  Supine Hamstring Stretch with Strap - 3 reps - 1 sets - 30 sec hold - 2x daily - 7x weekly  Seated Hamstring Stretch - 3 reps - 1 sets - 30 sec hold - 2x daily - 7x weekly  Supine Double Knee to Chest - 3 reps - 1 sets - 30 sec hold - 2x daily - 7x weekly  Standing Glute Med Mobilization with Small Ball on Wall - 1 reps - 1 sets - 2-3 min hold - 2x daily - 7x weekly  Patient Education  Trigger Point Dry Needling

## 2019-06-24 NOTE — Progress Notes (Signed)
Carelink Summary Report / Loop Recorder 

## 2019-06-29 ENCOUNTER — Ambulatory Visit: Payer: Medicare Other | Admitting: Physical Therapy

## 2019-06-29 ENCOUNTER — Other Ambulatory Visit: Payer: Self-pay

## 2019-06-29 ENCOUNTER — Encounter: Payer: Self-pay | Admitting: Physical Therapy

## 2019-06-29 DIAGNOSIS — R29898 Other symptoms and signs involving the musculoskeletal system: Secondary | ICD-10-CM | POA: Diagnosis not present

## 2019-06-29 DIAGNOSIS — M6281 Muscle weakness (generalized): Secondary | ICD-10-CM | POA: Diagnosis not present

## 2019-06-29 DIAGNOSIS — R252 Cramp and spasm: Secondary | ICD-10-CM

## 2019-06-29 DIAGNOSIS — M25551 Pain in right hip: Secondary | ICD-10-CM | POA: Diagnosis not present

## 2019-06-29 NOTE — Patient Instructions (Signed)
Access Code: [email or web address]  URL: [email or web address]  Date: 06/29/2019  Prepared by: Faustino Congress   Exercises  Supine Piriformis Stretch with Foot on Ground - 3 reps - 1 sets - 30 sec hold - 2x daily - 7x weekly  Seated Piriformis Stretch - 3 reps - 1 sets - 30 sec hold - 2x daily - 7x weekly  Supine Hamstring Stretch with Strap - 3 reps - 1 sets - 30 sec hold - 2x daily - 7x weekly  Seated Hamstring Stretch - 3 reps - 1 sets - 30 sec hold - 2x daily - 7x weekly  Supine Double Knee to Chest - 3 reps - 1 sets - 30 sec hold - 2x daily - 7x weekly  Standing Glute Med Mobilization with Small Ball on Wall - 1 reps - 1 sets - 2-3 min hold - 2x daily - 7x weekly  Hooklying Single Leg Bent Knee Fallouts with Resistance - 10 reps - 1 sets - 2x daily - 7x weekly  Supine Bridge with Resistance Band - 10 reps - 1 sets - 5 seconds hold - 2x daily - 7x weekly  Patient Education  Trigger Point Dry Needling

## 2019-06-29 NOTE — Therapy (Signed)
[facility] [address] [place], [place], [address] Phone: [phone]   Fax:  [phone]  Physical Therapy Treatment  Patient Details  Name: Andrea Kelley MRN: [id number] Date of Birth: 03/01/1941 Referring Provider (PT): Eunice Blase, MD   Encounter Date: 06/29/2019  PT End of Session - 06/29/19 1437    Visit Number  2    Number of Visits  12    Date for PT Re-Evaluation  08/04/19    PT Start Time  1518    PT Stop Time  1559    PT Time Calculation (min)  41 min    Activity Tolerance  Patient tolerated treatment well    Behavior During Therapy  [facility] for tasks assessed/performed       Past Medical History:  Diagnosis Date  . DDD (degenerative disc disease), lumbar   . Hyperlipidemia   . Osteopenia    [place]  . Syncope     Past Surgical History:  Procedure Laterality Date  . ABDOMINAL HYSTERECTOMY     For dysfunctional bleeding; no BSO  . BACK SURGERY  1970   ruptured disc ; Dr Quentin Cornwall  . COLONOSCOPY  2001   Negative; done for rectal bleeding  . LOOP RECORDER INSERTION N/A 02/16/2017   Procedure: Loop Recorder Insertion;  Surgeon: Sanda Klein, MD;  Location: [facility];  Service: Cardiovascular;  Laterality: N/A;    There were no vitals filed for this visit.  Subjective Assessment - 06/29/19 1323    Subjective  no pain currently, doesn't feel any better or worse.    Limitations  Standing;Walking    How long can you stand comfortably?  no more than 10 min    How long can you walk comfortably?  3/4 mile (unable recently due to pain)    Patient Stated Goals  improve pain    Currently in Pain?  No/denies    Pain Onset  More than a month ago                       [facility] Adult PT Treatment/Exercise - 06/29/19 1324      Lumbar Exercises: Stretches   Passive Hamstring Stretch  Right;2 reps;30 seconds    Piriformis Stretch  Left;2 reps;30 seconds    Piriformis Stretch Limitations  supine       Knee/Hip Exercises: Aerobic   Nustep  L4 x 6 min      Knee/Hip Exercises: Supine   Bridges  10 reps    Other Supine Knee/Hip Exercises  single limb clamshell with Level 2 band x 10 reps each      Manual Therapy   Manual Therapy  Soft tissue mobilization    Manual therapy comments  skilled palpation and monitoring of soft tissue during DN    Soft tissue mobilization  IASTM to Rt glutes and piriformis       Trigger Point Dry Needling - 06/29/19 1426    Consent Given?  Yes    Education Handout Provided  Yes    Muscles Treated Back/Hip  Gluteus maximus;Piriformis    Gluteus Maximus Response  Twitch response elicited;Palpable increased muscle length    Piriformis Response  Twitch response elicited;Palpable increased muscle length           PT Education - 06/29/19 1437    Education Details  added to HEP    Person(s) Educated  Patient    Methods  Explanation;Demonstration;Handout    Comprehension  Verbalized understanding;Returned  demonstration;Need further instruction          PT Long Term Goals - 06/23/19 1027      PT LONG TERM GOAL #1   Title  independent with HEP    Status  New    Target Date  08/04/19      PT LONG TERM GOAL #2   Title  report ability to walk dog for at least 3/4 mile without increase in pain for improved function    Status  New    Target Date  08/04/19      PT LONG TERM GOAL #3   Title  report ability to stand > 15 min without increase in pain for improved function    Status  New    Target Date  08/04/19      PT LONG TERM GOAL #4   Title  demonstrate at least 4/5 bil hip abduction strength for improved function and decreased risk of reinjury    Status  New    Target Date  08/04/19            Plan - 06/29/19 1438    Clinical Impression Statement  Pt reported decreased pain following DN and manual therapy today and decreased pain with stretches.  Able to incorporate strengthening exercises today without exacerbating symptoms.   Will continue to benefit from PT to maximize function.    Personal Factors and Comorbidities  Past/Current Experience;Comorbidity 2    Comorbidities  osteopenia, DDD    Examination-Activity Limitations  Stand;Locomotion Level;Bed Mobility    Examination-Participation Restrictions  Cleaning    Stability/Clinical Decision Making  Evolving/Moderate complexity    Rehab Potential  Good    PT Frequency  2x / week    PT Duration  6 weeks    PT Treatment/Interventions  ADLs/Self Care Home Management;Cryotherapy;Electrical Stimulation;Ultrasound;Traction;Moist Heat;Iontophoresis 4mg /ml Dexamethasone;Gait training;Stair training;Therapeutic activities;Therapeutic exercise;Patient/family education;Manual techniques;Neuromuscular re-education;Taping;Dry needling    PT Next Visit Plan  review HEP, assess response to DN to glutes/piriformis, ionto if pt interested, begin strengthening exercises    PT Home Exercise Plan  Access Code: [email or web address]    Consulted and Agree with Plan of Care  Patient       Patient will benefit from skilled therapeutic intervention in order to improve the following deficits and impairments:  Increased fascial restricitons, Increased muscle spasms, Pain, Postural dysfunction, Decreased strength, Decreased range of motion, Impaired flexibility, Decreased mobility  Visit Diagnosis: Pain in right hip  Muscle weakness (generalized)  Cramp and spasm  Other symptoms and signs involving the musculoskeletal system     Problem List Patient Active Problem List   Diagnosis Date Noted  . Routine general medical examination at a health care facility 05/27/2018  . Essential (primary) hypertension 05/20/2017  . Syncope 02/09/2017  . DDD (degenerative disc disease), lumbar 01/16/2011  . RESTLESS LEG SYNDROME 06/07/2009  . Hyperglycemia 06/07/2009  . Hyperlipidemia 07/19/2007       14/03/2007, PT, DPT 06/29/19 2:40 PM     [facility] [address] [place], [place], [place] Phone: [phone]   Fax:  [phone]  Name: JANAN BOGIE MRN: Doralee Albino Date of Birth: May 19, 1941

## 2019-07-04 ENCOUNTER — Encounter: Payer: Self-pay | Admitting: Physical Therapy

## 2019-07-04 ENCOUNTER — Telehealth: Payer: Self-pay | Admitting: Physical Therapy

## 2019-07-04 ENCOUNTER — Ambulatory Visit: Payer: Medicare Other | Admitting: Physical Therapy

## 2019-07-04 ENCOUNTER — Other Ambulatory Visit: Payer: Self-pay

## 2019-07-04 DIAGNOSIS — R252 Cramp and spasm: Secondary | ICD-10-CM

## 2019-07-04 DIAGNOSIS — M6281 Muscle weakness (generalized): Secondary | ICD-10-CM | POA: Diagnosis not present

## 2019-07-04 DIAGNOSIS — M25551 Pain in right hip: Secondary | ICD-10-CM

## 2019-07-04 DIAGNOSIS — R29898 Other symptoms and signs involving the musculoskeletal system: Secondary | ICD-10-CM

## 2019-07-04 NOTE — Patient Instructions (Signed)

## 2019-07-04 NOTE — Telephone Encounter (Signed)
Dr. Ellyn Hack- I would like to do iontophoresis to Andrea Kelley's Rt hip.  She has a loop recorder and wanted to make sure it's okay to proceed.  Okay for PT to do ionto to the Rt hip?  Thanks- Laureen Abrahams, PT, DPT 07/04/19 3:24 PM

## 2019-07-04 NOTE — Therapy (Signed)
[facility] Physical Therapy [address] [place], [place], [address] Phone: [phone]   Fax:  [phone]  Physical Therapy Treatment  Patient Details  Name: Andrea Kelley MRN: [id number] Date of Birth: Sep 21, 1940 Referring Provider (PT): Eunice Blase, MD   Encounter Date: 07/04/2019  PT End of Session - 07/04/19 1528    Visit Number  3    Number of Visits  12    Date for PT Re-Evaluation  08/04/19    PT Start Time  1287    PT Stop Time  1523    PT Time Calculation (min)  38 min    Activity Tolerance  Patient tolerated treatment well    Behavior During Therapy  [facility] for tasks assessed/performed       Past Medical History:  Diagnosis Date  . DDD (degenerative disc disease), lumbar   . Hyperlipidemia   . Osteopenia    [place] [facility]  . Syncope     Past Surgical History:  Procedure Laterality Date  . ABDOMINAL HYSTERECTOMY     For dysfunctional bleeding; no BSO  . BACK SURGERY  1970   ruptured disc ; Dr Quentin Cornwall  . COLONOSCOPY  2001   Negative; done for rectal bleeding  . LOOP RECORDER INSERTION N/A 02/16/2017   Procedure: Loop Recorder Insertion;  Surgeon: Sanda Klein, MD;  Location: [place] [facility];  Service: Cardiovascular;  Laterality: N/A;    There were no vitals filed for this visit.  Subjective Assessment - 07/04/19 1448    Subjective  no change in pain since last session.  only notices that it's a little "easier" to lift her leg.    Limitations  Standing;Walking    How long can you stand comfortably?  no more than 10 min    How long can you walk comfortably?  3/4 mile (unable recently due to pain)    Patient Stated Goals  improve pain    Currently in Pain?  No/denies    Pain Onset  More than a month ago                       [facility] Adult PT Treatment/Exercise - 07/04/19 1448      Knee/Hip Exercises: Aerobic   Nustep  L5 x 6 min      Knee/Hip Exercises: Standing   SLS  RLE 3 x 20 sec; cues for Glute Med  activation      Manual Therapy   Manual Therapy  Soft tissue mobilization    Manual therapy comments  skilled palpation and monitoring of soft tissue during DN    Soft tissue mobilization  Rt glutes and piriformis             PT Education - 07/04/19 1528    Education Details  ionto-left msg for cardiologist before proceeding    Person(s) Educated  Patient    Methods  Explanation;Demonstration;Handout    Comprehension  Verbalized understanding;Returned demonstration;Need further instruction          PT Long Term Goals - 06/23/19 1027      PT LONG TERM GOAL #1   Title  independent with HEP    Status  New    Target Date  08/04/19      PT LONG TERM GOAL #2   Title  report ability to walk dog for at least 3/4 mile without increase in pain for improved function    Status  New    Target Date  08/04/19  PT LONG TERM GOAL #3   Title  report ability to stand > 15 min without increase in pain for improved function    Status  New    Target Date  08/04/19      PT LONG TERM GOAL #4   Title  demonstrate at least 4/5 bil hip abduction strength for improved function and decreased risk of reinjury    Status  New    Target Date  08/04/19            Plan - 07/04/19 1536    Clinical Impression Statement  Pt reported no significant change in pain after last session, but does feel movement of RLE has gotten easier.  Requested DN again today, and feel she will benefit from ionto, but pt requesting clearance from cardiologist before proceeding as she has a loop recorder.  No goals met at this time.    Personal Factors and Comorbidities  Past/Current Experience;Comorbidity 2    Comorbidities  osteopenia, DDD    Examination-Activity Limitations  Stand;Locomotion Level;Bed Mobility    Examination-Participation Restrictions  Cleaning    Stability/Clinical Decision Making  Evolving/Moderate complexity    Rehab Potential  Good    PT Frequency  2x / week    PT Duration  6 weeks     PT Treatment/Interventions  ADLs/Self Care Home Management;Cryotherapy;Electrical Stimulation;Ultrasound;Traction;Moist Heat;Iontophoresis '4mg'$ /ml Dexamethasone;Gait training;Stair training;Therapeutic activities;Therapeutic exercise;Patient/family education;Manual techniques;Neuromuscular re-education;Taping;Dry needling    PT Next Visit Plan  assess response to DN to glutes/piriformis, ionto if pt interested, begin strengthening exercises    PT Home Exercise Plan  Access Code: [email or web address]    Consulted and Agree with Plan of Care  Patient       Patient will benefit from skilled therapeutic intervention in order to improve the following deficits and impairments:  Increased fascial restricitons, Increased muscle spasms, Pain, Postural dysfunction, Decreased strength, Decreased range of motion, Impaired flexibility, Decreased mobility  Visit Diagnosis: Pain in right hip  Muscle weakness (generalized)  Cramp and spasm  Other symptoms and signs involving the musculoskeletal system     Problem List Patient Active Problem List   Diagnosis Date Noted  . Routine general medical examination at a health care facility 05/27/2018  . Essential (primary) hypertension 05/20/2017  . Syncope 02/09/2017  . DDD (degenerative disc disease), lumbar 01/16/2011  . RESTLESS LEG SYNDROME 06/07/2009  . Hyperglycemia 06/07/2009  . Hyperlipidemia 07/19/2007        Laureen Abrahams, PT, DPT 07/04/19 3:39 PM     [facility] Physical Therapy [address] [place], [place], [address] Phone: [phone]   Fax:  [phone]  Name: Andrea Kelley MRN: [id number] Date of Birth: June 13, 1941

## 2019-07-05 ENCOUNTER — Encounter: Payer: Self-pay | Admitting: Physical Therapy

## 2019-07-05 ENCOUNTER — Ambulatory Visit: Payer: Medicare Other | Admitting: Physical Therapy

## 2019-07-05 DIAGNOSIS — M6281 Muscle weakness (generalized): Secondary | ICD-10-CM

## 2019-07-05 DIAGNOSIS — M25551 Pain in right hip: Secondary | ICD-10-CM | POA: Diagnosis not present

## 2019-07-05 DIAGNOSIS — R29898 Other symptoms and signs involving the musculoskeletal system: Secondary | ICD-10-CM

## 2019-07-05 DIAGNOSIS — R252 Cramp and spasm: Secondary | ICD-10-CM

## 2019-07-05 NOTE — Therapy (Signed)
[facility] Physical Therapy [address] [place], [place], [address] Phone: [phone]   Fax:  [phone]  Physical Therapy Treatment  Patient Details  Name: Andrea Kelley MRN: [id number] Date of Birth: 17-Apr-1941 Referring Provider (PT): Eunice Blase, MD   Encounter Date: 07/05/2019  PT End of Session - 07/05/19 1135    Visit Number  4    Number of Visits  12    Date for PT Re-Evaluation  08/04/19    PT Start Time  0930    PT Stop Time  1010    PT Time Calculation (min)  40 min    Activity Tolerance  Patient tolerated treatment well    Behavior During Therapy  [facility] for tasks assessed/performed       Past Medical History:  Diagnosis Date  . DDD (degenerative disc disease), lumbar   . Hyperlipidemia   . Osteopenia    [place] [facility]  . Syncope     Past Surgical History:  Procedure Laterality Date  . ABDOMINAL HYSTERECTOMY     For dysfunctional bleeding; no BSO  . BACK SURGERY  1970   ruptured disc ; Dr Quentin Cornwall  . COLONOSCOPY  2001   Negative; done for rectal bleeding  . LOOP RECORDER INSERTION N/A 02/16/2017   Procedure: Loop Recorder Insertion;  Surgeon: Sanda Klein, MD;  Location: [place] [facility];  Service: Cardiovascular;  Laterality: N/A;    There were no vitals filed for this visit.  Subjective Assessment - 07/05/19 0930    Subjective  no change after DN yesterday afternoon.  doing well today.  reports same pain today, but did sleep better last night with decreased pain.    Limitations  Standing;Walking    How long can you stand comfortably?  no more than 10 min    How long can you walk comfortably?  3/4 mile (unable recently due to pain)    Patient Stated Goals  improve pain    Pain Onset  More than a month ago                       [facility] Adult PT Treatment/Exercise - 07/05/19 0931      Knee/Hip Exercises: Stretches   Passive Hamstring Stretch  Both;2 reps;30 seconds    Passive Hamstring Stretch  Limitations  seated    Quad Stretch  Right;2 reps;30 seconds    Quad Stretch Limitations  prone with bolster at distal thight    Hip Flexor Stretch  Right;2 reps;30 seconds    Hip Flexor Stretch Limitations  supine with PT assist    Piriformis Stretch  Both;2 reps;30 seconds    Piriformis Stretch Limitations  seated      Knee/Hip Exercises: Aerobic   Nustep  L5 x 6 min      Knee/Hip Exercises: Supine   Bridges  10 reps    Bridges Limitations  with band for isometric hip abduction/er hold    Other Supine Knee/Hip Exercises  single limb clamshell with Level 3 band x 10 reps each    Other Supine Knee/Hip Exercises  clamshell x 10; level 3 band      Knee/Hip Exercises: Prone   Straight Leg Raises  Right;10 reps      Modalities   Modalities  Iontophoresis      Iontophoresis   Type of Iontophoresis  Dexamethasone    Location  Rt hip    Dose  1.0 cc    Time  6 hour patch  Manual Therapy   Manual Therapy  Joint mobilization    Joint Mobilization  Rt hip LAD and A/P mobs grades 2-3                  PT Long Term Goals - 06/23/19 1027      PT LONG TERM GOAL #1   Title  independent with HEP    Status  New    Target Date  08/04/19      PT LONG TERM GOAL #2   Title  report ability to walk dog for at least 3/4 mile without increase in pain for improved function    Status  New    Target Date  08/04/19      PT LONG TERM GOAL #3   Title  report ability to stand > 15 min without increase in pain for improved function    Status  New    Target Date  08/04/19      PT LONG TERM GOAL #4   Title  demonstrate at least 4/5 bil hip abduction strength for improved function and decreased risk of reinjury    Status  New    Target Date  08/04/19            Plan - 07/05/19 1135    Clinical Impression Statement  Pt arrived today reporting no change in pain, but then expressed decreased pain at night and sleeping better.  Overall seeing small functional improvements, but  pain still persists with walking.  Trial of iontophoresis today to see if pt will benefit.    Personal Factors and Comorbidities  Past/Current Experience;Comorbidity 2    Comorbidities  osteopenia, DDD    Examination-Activity Limitations  Stand;Locomotion Level;Bed Mobility    Examination-Participation Restrictions  Cleaning    Stability/Clinical Decision Making  Evolving/Moderate complexity    Rehab Potential  Good    PT Frequency  2x / week    PT Duration  6 weeks    PT Treatment/Interventions  ADLs/Self Care Home Management;Cryotherapy;Electrical Stimulation;Ultrasound;Traction;Moist Heat;Iontophoresis 4mg /ml Dexamethasone;Gait training;Stair training;Therapeutic activities;Therapeutic exercise;Patient/family education;Manual techniques;Neuromuscular re-education;Taping;Dry needling    PT Next Visit Plan  assess response to DN to glutes/piriformis,assess response to ionto, begin strengthening exercises    PT Home Exercise Plan  Access Code: [email or web address]    Consulted and Agree with Plan of Care  Patient       Patient will benefit from skilled therapeutic intervention in order to improve the following deficits and impairments:  Increased fascial restricitons, Increased muscle spasms, Pain, Postural dysfunction, Decreased strength, Decreased range of motion, Impaired flexibility, Decreased mobility  Visit Diagnosis: Pain in right hip  Muscle weakness (generalized)  Cramp and spasm  Other symptoms and signs involving the musculoskeletal system     Problem List Patient Active Problem List   Diagnosis Date Noted  . Routine general medical examination at a health care facility 05/27/2018  . Essential (primary) hypertension 05/20/2017  . Syncope 02/09/2017  . DDD (degenerative disc disease), lumbar 01/16/2011  . RESTLESS LEG SYNDROME 06/07/2009  . Hyperglycemia 06/07/2009  . Hyperlipidemia 07/19/2007      14/03/2007, PT, DPT 07/05/19 11:37 AM     [facility] Physical Therapy [address] [place], [place], [place] Phone: [phone]   Fax:  [phone]  Name: Andrea Kelley MRN: Doralee Albino Date of Birth: [birth year]

## 2019-07-05 NOTE — Telephone Encounter (Signed)
I can not see why not.  [place]

## 2019-07-10 LAB — CUP PACEART REMOTE DEVICE CHECK
Date Time Interrogation Session: 20201126114112
Implantable Pulse Generator Implant Date: 20180709

## 2019-07-11 ENCOUNTER — Ambulatory Visit (INDEPENDENT_AMBULATORY_CARE_PROVIDER_SITE_OTHER): Payer: Medicare Other | Admitting: *Deleted

## 2019-07-11 DIAGNOSIS — R55 Syncope and collapse: Secondary | ICD-10-CM

## 2019-07-13 ENCOUNTER — Ambulatory Visit: Payer: Medicare Other | Admitting: Physical Therapy

## 2019-07-13 ENCOUNTER — Encounter: Payer: Self-pay | Admitting: Physical Therapy

## 2019-07-13 ENCOUNTER — Other Ambulatory Visit: Payer: Self-pay

## 2019-07-13 DIAGNOSIS — R252 Cramp and spasm: Secondary | ICD-10-CM | POA: Diagnosis not present

## 2019-07-13 DIAGNOSIS — M6281 Muscle weakness (generalized): Secondary | ICD-10-CM

## 2019-07-13 DIAGNOSIS — M25551 Pain in right hip: Secondary | ICD-10-CM | POA: Diagnosis not present

## 2019-07-13 DIAGNOSIS — R29898 Other symptoms and signs involving the musculoskeletal system: Secondary | ICD-10-CM | POA: Diagnosis not present

## 2019-07-13 NOTE — Therapy (Signed)
[facility] Physical Therapy [address] [place], [place], [address] Phone: [phone]   Fax:  [phone]  Physical Therapy Treatment  Patient Details  Name: Andrea Kelley MRN: [id number] Date of Birth: Sep 22, 1940 Referring Provider (PT): Eunice Blase, MD   Encounter Date: 07/13/2019  PT End of Session - 07/13/19 1300    Visit Number  5    Number of Visits  12    Date for PT Re-Evaluation  08/04/19    PT Start Time  0086    PT Stop Time  1228    PT Time Calculation (min)  43 min    Activity Tolerance  Patient tolerated treatment well    Behavior During Therapy  [facility] for tasks assessed/performed       Past Medical History:  Diagnosis Date  . DDD (degenerative disc disease), lumbar   . Hyperlipidemia   . Osteopenia     [place]  . Syncope     Past Surgical History:  Procedure Laterality Date  . ABDOMINAL HYSTERECTOMY     For dysfunctional bleeding; no BSO  . BACK SURGERY  1970   ruptured disc ; Dr Quentin Cornwall  . COLONOSCOPY  2001   Negative; done for rectal bleeding  . LOOP RECORDER INSERTION N/A 02/16/2017   Procedure: Loop Recorder Insertion;  Surgeon: Sanda Klein, MD;  Location: [place] [facility];  Service: Cardiovascular;  Laterality: N/A;    There were no vitals filed for this visit.  Subjective Assessment - 07/13/19 1150    Subjective  doing well; didn't notice much of a change after the ionto for the first few days but then has had no pain since Sunday.    Limitations  Standing;Walking    How long can you stand comfortably?  no more than 10 min    How long can you walk comfortably?  3/4 mile (unable recently due to pain)    Patient Stated Goals  improve pain    Currently in Pain?  No/denies    Pain Onset  More than a month ago                       [facility] Adult PT Treatment/Exercise - 07/13/19 1153      Knee/Hip Exercises: Stretches   Passive Hamstring Stretch  Both;2 reps;30 seconds    Passive Hamstring  Stretch Limitations  seated    Piriformis Stretch  Both;2 reps;30 seconds    Piriformis Stretch Limitations  seated      Knee/Hip Exercises: Aerobic   Nustep  L5 x 6 min      Knee/Hip Exercises: Standing   Other Standing Knee Exercises  side step with L1 theraband x 10' each direction - mild increase in symptoms    Other Standing Knee Exercises  hip hike x 10 bil off 6" step      Knee/Hip Exercises: Supine   Bridges  10 reps    Other Supine Knee/Hip Exercises  single limb clamshell with Level 3 band x 10 reps each      Knee/Hip Exercises: Sidelying   Hip ABduction  Right;10 reps    Hip ABduction Limitations  with extension    Clams  Rt x 10 reps      Iontophoresis   Type of Iontophoresis  Dexamethasone    Location  Rt hip    Dose  1.0 cc    Time  6 hour patch  PT Long Term Goals - 06/23/19 1027      PT LONG TERM GOAL #1   Title  independent with HEP    Status  New    Target Date  08/04/19      PT LONG TERM GOAL #2   Title  report ability to walk dog for at least 3/4 mile without increase in pain for improved function    Status  New    Target Date  08/04/19      PT LONG TERM GOAL #3   Title  report ability to stand > 15 min without increase in pain for improved function    Status  New    Target Date  08/04/19      PT LONG TERM GOAL #4   Title  demonstrate at least 4/5 bil hip abduction strength for improved function and decreased risk of reinjury    Status  New    Target Date  08/04/19            Plan - 07/13/19 1300    Clinical Impression Statement  Pt tolerated session well today, and trial of progressing exercises to weight bearing increased symptoms which resolved quickly.  Overall progressing well with PT with increased days of no pain.    Personal Factors and Comorbidities  Past/Current Experience;Comorbidity 2    Comorbidities  osteopenia, DDD    Examination-Activity Limitations  Stand;Locomotion Level;Bed Mobility     Examination-Participation Restrictions  Cleaning    Stability/Clinical Decision Making  Evolving/Moderate complexity    Rehab Potential  Good    PT Frequency  2x / week    PT Duration  6 weeks    PT Treatment/Interventions  ADLs/Self Care Home Management;Cryotherapy;Electrical Stimulation;Ultrasound;Traction;Moist Heat;Iontophoresis 4mg /ml Dexamethasone;Gait training;Stair training;Therapeutic activities;Therapeutic exercise;Patient/family education;Manual techniques;Neuromuscular re-education;Taping;Dry needling    PT Next Visit Plan  assess response to DN to glutes/piriformis,assess response to ionto, begin strengthening exercises    PT Home Exercise Plan  Access Code: [email or web address]    Consulted and Agree with Plan of Care  Patient       Patient will benefit from skilled therapeutic intervention in order to improve the following deficits and impairments:  Increased fascial restricitons, Increased muscle spasms, Pain, Postural dysfunction, Decreased strength, Decreased range of motion, Impaired flexibility, Decreased mobility  Visit Diagnosis: Pain in right hip  Muscle weakness (generalized)  Cramp and spasm  Other symptoms and signs involving the musculoskeletal system     Problem List Patient Active Problem List   Diagnosis Date Noted  . Routine general medical examination at a health care facility 05/27/2018  . Essential (primary) hypertension 05/20/2017  . Syncope 02/09/2017  . DDD (degenerative disc disease), lumbar 01/16/2011  . RESTLESS LEG SYNDROME 06/07/2009  . Hyperglycemia 06/07/2009  . Hyperlipidemia 07/19/2007      14/03/2007, PT, DPT 07/13/19 1:02 PM     [facility] Physical Therapy [address] [place], [place], [place] Phone: [phone]   Fax:  [phone]  Name: Andrea Kelley MRN: Doralee Albino Date of Birth: [birth year]

## 2019-07-18 ENCOUNTER — Ambulatory Visit: Payer: Medicare Other | Admitting: Family Medicine

## 2019-07-27 ENCOUNTER — Other Ambulatory Visit: Payer: Self-pay

## 2019-07-27 ENCOUNTER — Ambulatory Visit: Payer: Medicare Other | Admitting: Physical Therapy

## 2019-07-27 ENCOUNTER — Encounter: Payer: Self-pay | Admitting: Physical Therapy

## 2019-07-27 DIAGNOSIS — M25551 Pain in right hip: Secondary | ICD-10-CM

## 2019-07-27 DIAGNOSIS — R29898 Other symptoms and signs involving the musculoskeletal system: Secondary | ICD-10-CM | POA: Diagnosis not present

## 2019-07-27 DIAGNOSIS — R252 Cramp and spasm: Secondary | ICD-10-CM

## 2019-07-27 DIAGNOSIS — M6281 Muscle weakness (generalized): Secondary | ICD-10-CM | POA: Diagnosis not present

## 2019-07-27 NOTE — Therapy (Addendum)
[facility] Physical Therapy [address] [place], [place], [address] Phone: [phone]   Fax:  [phone]  Physical Therapy Treatment/Discharge Summary  Patient Details  Name: Andrea Kelley MRN: [id number] Date of Birth: 04/02/1941 Referring Provider (PT): Eunice Blase, MD   Encounter Date: 07/27/2019  PT End of Session - 07/27/19 1348    Visit Number  6    Number of Visits  12    Date for PT Re-Evaluation  08/04/19    PT Start Time  1310    PT Stop Time  1345    PT Time Calculation (min)  35 min    Activity Tolerance  Patient tolerated treatment well    Behavior During Therapy  [facility] for tasks assessed/performed       Past Medical History:  Diagnosis Date  . DDD (degenerative disc disease), lumbar   . Hyperlipidemia   . Osteopenia    Andrea Kelley  . Syncope     Past Surgical History:  Procedure Laterality Date  . ABDOMINAL HYSTERECTOMY     For dysfunctional bleeding; no BSO  . BACK SURGERY  1970   ruptured disc ; Dr Quentin Cornwall  . COLONOSCOPY  2001   Negative; done for rectal bleeding  . LOOP RECORDER INSERTION N/A 02/16/2017   Procedure: Loop Recorder Insertion;  Surgeon: Sanda Klein, MD;  Location: [place] [facility];  Service: Cardiovascular;  Laterality: N/A;    There were no vitals filed for this visit.  Subjective Assessment - 07/27/19 1310    Subjective  "I haven't had any pain in 4-5 days, and I feel good."    Limitations  Standing;Walking    How long can you stand comfortably?  no difficulties at this time    How long can you walk comfortably?  no difficulty at this time    Patient Stated Goals  improve pain    Currently in Pain?  No/denies    Pain Onset  More than a month ago         [facility] PT Assessment - 07/27/19 1330      Assessment   Medical Diagnosis  History of lumbar stenosis; right greater trochanter syndrome    Referring Provider (PT)  Hilts, Michael, MD      Strength   Right Hip Flexion  4/5    Right Hip  ABduction  4/5    Left Hip Flexion  4/5    Left Hip ABduction  4/5                   [facility] Adult PT Treatment/Exercise - 07/27/19 1313      Knee/Hip Exercises: Stretches   Passive Hamstring Stretch  Both;2 reps;30 seconds    Passive Hamstring Stretch Limitations  seated    Piriformis Stretch  Both;2 reps;30 seconds    Piriformis Stretch Limitations  seated      Knee/Hip Exercises: Aerobic   Nustep  L6 x 6 min      Knee/Hip Exercises: Standing   SLS  RLE 3 x 20 sec; cues for Glute Med activation      Knee/Hip Exercises: Supine   Bridges  10 reps    Bridges Limitations  with band for isometric hip abduction/er hold    Other Supine Knee/Hip Exercises  single limb clamshell with Level 3 band x 10 reps each    Other Supine Knee/Hip Exercises  clamshell x 10; level 3 band  PT Long Term Goals - 07/27/19 1348      PT LONG TERM GOAL #1   Title  independent with HEP    Status  Achieved      PT LONG TERM GOAL #2   Title  report ability to walk dog for at least 3/4 mile without increase in pain for improved function    Status  Achieved      PT LONG TERM GOAL #3   Title  report ability to stand > 15 min without increase in pain for improved function    Status  Achieved      PT LONG TERM GOAL #4   Title  demonstrate at least 4/5 bil hip abduction strength for improved function and decreased risk of reinjury    Status  Achieved            Plan - 07/27/19 1348    Clinical Impression Statement  Pt has met all goals, and reports no pain x 5 days.  Doing well and will hold PT x 30 days.  Pt to call if symptoms return, otherwise will plan to d/c.    Personal Factors and Comorbidities  Past/Current Experience;Comorbidity 2    Comorbidities  osteopenia, DDD    Examination-Activity Limitations  Stand;Locomotion Level;Bed Mobility    Examination-Participation Restrictions  Cleaning    Stability/Clinical Decision Making  Evolving/Moderate  complexity    Rehab Potential  Good    PT Frequency  2x / week    PT Duration  6 weeks    PT Treatment/Interventions  ADLs/Self Care Home Management;Cryotherapy;Electrical Stimulation;Ultrasound;Traction;Moist Heat;Iontophoresis 70m/ml Dexamethasone;Gait tScientist, forensicTherapeutic activities;Therapeutic exercise;Patient/family education;Manual techniques;Neuromuscular re-education;Taping;Dry needling    PT Next Visit Plan  hold PT x 30 days, reassess v. d/c    PT Home Exercise Plan  Access Code: [email or web address]   Consulted and Agree with Plan of Care  Patient       Patient will benefit from skilled therapeutic intervention in order to improve the following deficits and impairments:  Increased fascial restricitons, Increased muscle spasms, Pain, Postural dysfunction, Decreased strength, Decreased range of motion, Impaired flexibility, Decreased mobility  Visit Diagnosis: Pain in right hip  Muscle weakness (generalized)  Cramp and spasm  Other symptoms and signs involving the musculoskeletal system     Problem List Patient Active Problem List   Diagnosis Date Noted  . Routine general medical examination at a health care facility 05/27/2018  . Essential (primary) hypertension 05/20/2017  . Syncope 02/09/2017  . DDD (degenerative disc disease), lumbar 01/16/2011  . RESTLESS LEG SYNDROME 06/07/2009  . Hyperglycemia 06/07/2009  . Hyperlipidemia 07/19/2007      SLaureen Abrahams PT, DPT 07/27/19 1:49 PM     [facility] [address] [address]Phone: [phone]  Fax:  [phone] Name: Andrea KEEVENMRN: [id number] of Birth: 703/23/1942     PHYSICAL THERAPY DISCHARGE SUMMARY  Visits from Start of Care: 6  Current functional level related to goals / functional outcomes: See above   Remaining deficits: See above   Education / Equipment: HEP  Plan: Patient agrees to discharge.  Patient  goals were met. Patient is being discharged due to meeting the stated rehab goals.  ?????    SLaureen Abrahams PT, DPT 09/08/19 11:44 AM [facility] [address] [address]Phone: [phone]  Fax:  [phone]

## 2019-08-02 ENCOUNTER — Encounter: Payer: Self-pay | Admitting: Physical Therapy

## 2019-08-03 NOTE — Progress Notes (Signed)
ILR remote 

## 2019-08-11 ENCOUNTER — Ambulatory Visit (INDEPENDENT_AMBULATORY_CARE_PROVIDER_SITE_OTHER): Payer: Medicare Other | Admitting: *Deleted

## 2019-08-11 DIAGNOSIS — R55 Syncope and collapse: Secondary | ICD-10-CM | POA: Diagnosis not present

## 2019-08-11 LAB — CUP PACEART REMOTE DEVICE CHECK
Date Time Interrogation Session: 20201229120013
Implantable Pulse Generator Implant Date: 20180709

## 2019-08-24 ENCOUNTER — Ambulatory Visit: Payer: Medicare Other | Attending: Internal Medicine

## 2019-08-24 DIAGNOSIS — Z23 Encounter for immunization: Secondary | ICD-10-CM

## 2019-08-24 NOTE — Progress Notes (Signed)
   Covid-19 Vaccination Clinic  Name:  SHERRIA RIEMANN    MRN: [id number] DOB: Aug 03, 1941  08/24/2019  Ms. Alfonzo was observed post Covid-19 immunization for 15 minutes without incidence. She was provided with Vaccine Information Sheet and instruction to access the V-Safe system.   Ms. Bluett was instructed to call 911 with any severe reactions post vaccine: Marland Kitchen Difficulty breathing  . Swelling of your face and throat  . A fast heartbeat  . A bad rash all over your body  . Dizziness and weakness    Immunizations Administered    Name Date Dose VIS Date Route   Pfizer COVID-19 Vaccine 08/24/2019  8:30 AM 0.3 mL 07/22/2019 Intramuscular   Manufacturer: [employer], [employer]   Lot: [id number]   NDC: [address]-3

## 2019-09-12 ENCOUNTER — Ambulatory Visit (INDEPENDENT_AMBULATORY_CARE_PROVIDER_SITE_OTHER): Payer: Medicare Other | Admitting: *Deleted

## 2019-09-12 DIAGNOSIS — R55 Syncope and collapse: Secondary | ICD-10-CM

## 2019-09-12 LAB — CUP PACEART REMOTE DEVICE CHECK
Date Time Interrogation Session: 20210201002739
Implantable Pulse Generator Implant Date: 20180709

## 2019-09-12 NOTE — Progress Notes (Signed)
ILR Remote 

## 2019-09-13 ENCOUNTER — Ambulatory Visit: Payer: Medicare Other | Attending: Internal Medicine

## 2019-09-13 DIAGNOSIS — Z23 Encounter for immunization: Secondary | ICD-10-CM

## 2019-09-13 NOTE — Progress Notes (Signed)
   Covid-19 Vaccination Clinic  Name:  Andrea Kelley    MRN: [id number] DOB: 06-28-1941  09/13/2019  [name] was observed post Covid-19 immunization for 15 minutes without incidence. She was provided with Vaccine Information Sheet and instruction to access the V-Safe system.   [name] was instructed to call 911 with any severe reactions post vaccine: Marland Kitchen Difficulty breathing  . Swelling of your face and throat  . A fast heartbeat  . A bad rash all over your body  . Dizziness and weakness    Immunizations Administered    Name Date Dose VIS Date Route   Pfizer COVID-19 Vaccine 09/13/2019  9:02 AM 0.3 mL 07/22/2019 Intramuscular   Manufacturer: [employer], [employer]   Lot: [id number]   NDC: [address]-8

## 2019-10-13 ENCOUNTER — Ambulatory Visit (INDEPENDENT_AMBULATORY_CARE_PROVIDER_SITE_OTHER): Payer: Medicare Other | Admitting: *Deleted

## 2019-10-13 DIAGNOSIS — R55 Syncope and collapse: Secondary | ICD-10-CM | POA: Diagnosis not present

## 2019-10-13 LAB — CUP PACEART REMOTE DEVICE CHECK
Date Time Interrogation Session: 20210304025214
Implantable Pulse Generator Implant Date: 20180709

## 2019-10-13 NOTE — Progress Notes (Signed)
ILR Remote 

## 2019-10-25 ENCOUNTER — Encounter (INDEPENDENT_AMBULATORY_CARE_PROVIDER_SITE_OTHER): Payer: Medicare Other | Admitting: Ophthalmology

## 2019-10-25 DIAGNOSIS — H353112 Nonexudative age-related macular degeneration, right eye, intermediate dry stage: Secondary | ICD-10-CM

## 2019-10-25 DIAGNOSIS — H43813 Vitreous degeneration, bilateral: Secondary | ICD-10-CM

## 2019-10-25 DIAGNOSIS — H35033 Hypertensive retinopathy, bilateral: Secondary | ICD-10-CM

## 2019-10-25 DIAGNOSIS — I10 Essential (primary) hypertension: Secondary | ICD-10-CM | POA: Diagnosis not present

## 2019-10-25 DIAGNOSIS — H353221 Exudative age-related macular degeneration, left eye, with active choroidal neovascularization: Secondary | ICD-10-CM

## 2019-11-14 ENCOUNTER — Ambulatory Visit (INDEPENDENT_AMBULATORY_CARE_PROVIDER_SITE_OTHER): Payer: Medicare Other | Admitting: *Deleted

## 2019-11-14 DIAGNOSIS — R55 Syncope and collapse: Secondary | ICD-10-CM | POA: Diagnosis not present

## 2019-11-14 LAB — CUP PACEART REMOTE DEVICE CHECK
Date Time Interrogation Session: 20210404035419
Implantable Pulse Generator Implant Date: 20180709

## 2019-11-15 ENCOUNTER — Telehealth: Payer: Self-pay | Admitting: Family Medicine

## 2019-11-15 DIAGNOSIS — G8929 Other chronic pain: Secondary | ICD-10-CM

## 2019-11-15 NOTE — Telephone Encounter (Signed)
Patient called. She would like to start PT again. We can not take [facility] Medicare anymore for PT here. She would need a referral to [facility]. Her call back number is [phone]

## 2019-11-15 NOTE — Telephone Encounter (Signed)
Orders placed for [facility] PT.

## 2019-11-15 NOTE — Telephone Encounter (Signed)
Called patient left message to return call. Advised patient to call back and ask to be transferred to (PT) per patient request on mychart

## 2019-11-15 NOTE — Addendum Note (Signed)
Addended by: Lillia Carmel on: 11/15/2019 04:58 PM   Modules accepted: Orders

## 2019-11-15 NOTE — Progress Notes (Signed)
ILR Remote 

## 2019-11-17 ENCOUNTER — Ambulatory Visit: Payer: Medicare Other | Attending: Family Medicine | Admitting: Physical Therapy

## 2019-11-17 ENCOUNTER — Other Ambulatory Visit: Payer: Self-pay

## 2019-11-17 ENCOUNTER — Encounter: Payer: Self-pay | Admitting: Physical Therapy

## 2019-11-17 DIAGNOSIS — M6281 Muscle weakness (generalized): Secondary | ICD-10-CM | POA: Insufficient documentation

## 2019-11-17 DIAGNOSIS — R252 Cramp and spasm: Secondary | ICD-10-CM | POA: Insufficient documentation

## 2019-11-17 DIAGNOSIS — R29898 Other symptoms and signs involving the musculoskeletal system: Secondary | ICD-10-CM | POA: Insufficient documentation

## 2019-11-17 DIAGNOSIS — M25551 Pain in right hip: Secondary | ICD-10-CM

## 2019-11-17 NOTE — Therapy (Signed)
[facility] [address] [place], [place], [address] Phone: [phone]   Fax:  [phone]  Physical Therapy Evaluation  Patient Details  Name: Andrea Kelley MRN: [id number] Date of Birth: February 08, 1941 Referring Provider (PT): Dr. Eunice Blase   Encounter Date: 11/17/2019  PT End of Session - 11/17/19 1656    Visit Number  1    Date for PT Re-Evaluation  01/12/20    Authorization Type  BCBS Medicare    PT Start Time  1600    PT Stop Time  9485    PT Time Calculation (min)  45 min    Activity Tolerance  Patient tolerated treatment well;No increased pain    Behavior During Therapy  WFL for tasks assessed/performed       Past Medical History:  Diagnosis Date  . DDD (degenerative disc disease), lumbar   . Hyperlipidemia   . Osteopenia    [facility]  . Syncope     Past Surgical History:  Procedure Laterality Date  . ABDOMINAL HYSTERECTOMY     For dysfunctional bleeding; no BSO  . BACK SURGERY  1970   ruptured disc ; Dr Quentin Cornwall  . COLONOSCOPY  2001   Negative; done for rectal bleeding  . LOOP RECORDER INSERTION N/A 02/16/2017   Procedure: Loop Recorder Insertion;  Surgeon: Sanda Klein, MD;  Location: [facility];  Service: Cardiovascular;  Laterality: N/A;    There were no vitals filed for this visit.   Subjective Assessment - 11/17/19 1604    Subjective  I have had a flare-up with my right leg. It started on 11/12/2019. Pain starts in right hip and hurts when she walks.    Patient Stated Goals  get rid of pain in right leg    Currently in Pain?  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 Legrand Como Hilts    Onset Date/Surgical Date  11/12/19    Prior Therapy  yes, dry needling helped      Precautions   Precautions  Other (comment)    Precaution Comments  loop reader       Restrictions   Weight Bearing Restrictions  No      Balance Screen   Has the patient fallen in the past 6 months  No    Has the patient had a decrease in activity level because of a fear of falling?   No    Is the patient reluctant to leave their home because of a fear of falling?   No      Home Film/video editor residence  Prior Function   Level of Independence  Independent    Leisure  walk dog and limited due to right leg pain      Cognition   Overall Cognitive Status  Within Functional Limits for tasks assessed      Observation/Other Assessments   Focus on Therapeutic Outcomes (FOTO)   47% limitation and goal is 24% limitation      Posture/Postural Control   Posture/Postural Control  No significant limitations      ROM / Strength   AROM / PROM / Strength  AROM;PROM;Strength      AROM   Overall AROM Comments  lumbar ROM decreased by 25%      PROM   Right Hip External Rotation   45    Left Hip External Rotation   55      Strength   Right Hip Extension  2-/5    Right Hip ABduction  3+/5      Palpation   SI assessment   right ilium anteriorly rotated                Objective measurements completed on examination: See above findings.      [facility] Adult PT Treatment/Exercise - 11/17/19 0001      Lumbar Exercises: Stretches   Active Hamstring Stretch  Right;Left;1 rep;30 seconds    Active Hamstring Stretch Limitations  sitting     Piriformis Stretch  Right;Left;1 rep;30 seconds    Piriformis Stretch Limitations  sitting     Other Lumbar Stretch Exercise  using a tennis ball to massage the right gluteal in sitting or supine      Modalities   Modalities  Iontophoresis      Iontophoresis   Type of Iontophoresis  Dexamethasone    Location  right gluteal    Dose  1 ml, #1    Time  4 hour      Manual Therapy   Manual Therapy  Soft tissue mobilization;Muscle Energy Technique    Soft tissue mobilization  with addaday to the right gluteal to elongate the muscle after dry needling    Muscle Energy Technique  correct right ilium       Trigger Point Dry Needling - 11/17/19 0001    Consent Given?  Yes    Education Handout Provided  Yes    Muscles Treated Back/Hip  Gluteus medius;Gluteus maximus;Gluteus minimus    Gluteus Minimus Response  Twitch response elicited;Palpable increased muscle length    Gluteus Medius Response  Twitch response elicited;Palpable increased muscle length    Gluteus Maximus Response  Twitch response elicited;Palpable increased muscle length           PT Education - 11/17/19 1653    Education Details  Access Code: [email or web address]; information on dry needling and iontophoresis    Person(s) Educated  Patient    Methods  Explanation;Demonstration;Verbal cues;Handout    Comprehension  Returned demonstration;Verbalized understanding       PT Short Term Goals - 11/17/19 1708      PT SHORT TERM GOAL #1   Title  independent with initial HEP    Time  4    Period  Weeks    Status  New    Target Date  12/15/19      PT SHORT TERM GOAL #2   Title  right hip pain decreased >/= 25% with walking her dog and sitting    Time  4    Period  Weeks    Status  New  Target Date  12/15/19      PT SHORT TERM GOAL #3   Title  patient right ilium is in correct alignment for 4 weeks    Time  4    Period  Weeks    Status  New    Target Date  12/15/19        PT Long Term Goals - 11/17/19 1709       PT LONG TERM GOAL #1   Title  independent with HEP    Time  8    Period  Weeks    Status  New    Target Date  01/12/20      PT LONG TERM GOAL #2   Title  report ability to walk dog for at least 3/4 mile without increase in pain for improved function    Time  8    Period  Weeks    Status  New    Target Date  01/12/20      PT LONG TERM GOAL #3   Title  report ability to stand > 15 min without increase in pain for improved function    Time  8    Period  Weeks    Status  New    Target Date  01/12/20      PT LONG TERM GOAL #4   Title  demonstrate at least 4/5 bil hip abduction strength for improved function and decreased risk of reinjury    Time  8    Period  Weeks    Status  New    Target Date  01/12/20      PT LONG TERM GOAL #5   Title  FOTO score </= 24% limitation    Time  8    Period  Weeks    Status  New    Target Date  01/12/20             Plan - 11/17/19 1656    Clinical Impression Statement  Patient is a 79 year old female with right gluteal pain that goes down her right leg. Patient reports the pain started on 11/12/2019. She had the same pain last year and therapy helped. Patient right ilium is rotated anteriorly. Right hip P/ROM for external rotation is 45 degrees and on the left is 55 degreees.  Patient has weakness in the hip. Patient has pain with rolling in bed, standing too long and walking her dog. Patient pain level is 8/10. Patient walks with decreased weightbear on the right and a limp. Patient will benefit from skilled therapy to reduce her pain and improve her function.    Personal Factors and Comorbidities  Age;Comorbidity 3+    Comorbidities  Osteopenia; Back surgery 1970, loop reader insertion of the left chest    Examination-Activity Limitations  Bed Mobility;Locomotion Level;Carry;Stand    Examination-Participation Restrictions  Laundry;Yard Work;Community Activity;Shop    Stability/Clinical Decision Making  Evolving/Moderate complexity     Clinical Decision Making  Moderate    Rehab Potential  Excellent    PT Frequency  2x / week    PT Duration  8 weeks    PT Treatment/Interventions  Cryotherapy;Iontophoresis 4mg /ml Dexamethasone;Moist Heat;Neuromuscular re-education;Therapeutic exercise;Therapeutic activities;Patient/family education;Manual techniques;Dry needling;Taping;Joint Manipulations    PT Next Visit Plan  dry needling to the right gluteal, ionto #2, soft tissue work to right gluteal, correct right ilium, right hip abduction and extension strength    PT Home Exercise Plan  Access Code:    Consulted and Agree with  Plan of Care  Patient       Patient will benefit from skilled therapeutic intervention in order to improve the following deficits and impairments:  Abnormal gait, Decreased range of motion, Increased fascial restricitons, Increased muscle spasms, Pain, Decreased mobility, Decreased strength, Decreased activity tolerance  Visit Diagnosis: Pain in right hip - Plan: PT plan of care cert/re-cert  Muscle weakness (generalized) - Plan: PT plan of care cert/re-cert  Cramp and spasm - Plan: PT plan of care cert/re-cert  Other symptoms and signs involving the musculoskeletal system - Plan: PT plan of care cert/re-cert     Problem List Patient Active Problem List   Diagnosis Date Noted  . Routine general medical examination at a health care facility 05/27/2018  . Essential (primary) hypertension 05/20/2017  . Syncope 02/09/2017  . DDD (degenerative disc disease), lumbar 01/16/2011  . RESTLESS LEG SYNDROME 06/07/2009  . Hyperglycemia 06/07/2009  . Hyperlipidemia 07/19/2007    Eulis Foster, PT 11/17/19 5:12 PM   [facility] [address] [place], [place], [address] Phone: [phone]   Fax:  [phone]  Name: Andrea Kelley MRN: [id number] Date of Birth: Jan 05, 1941

## 2019-11-17 NOTE — Patient Instructions (Addendum)
IONTOPHORESIS PATIENT PRECAUTIONS & CONTRAINDICATIONS:  . Redness under one or both electrodes can occur.  This characterized by a uniform redness that usually disappears within 12 hours of treatment. . Small pinhead size blisters may result in response to the drug.  Contact your physician if the problem persists more than 24 hours. . On rare occasions, iontophoresis therapy can result in temporary skin reactions such as rash, inflammation, irritation or burns.  The skin reactions may be the result of individual sensitivity to the ionic solution used, the condition of the skin at the start of treatment, reaction to the materials in the electrodes, allergies or sensitivity to dexamethasone, or a poor connection between the patch and your skin.  Discontinue using iontophoresis if you have any of these reactions and report to your therapist. . Remove the Patch or electrodes if you have any undue sensation of pain or burning during the treatment and report discomfort to your therapist. . Tell your Therapist if you have had known adverse reactions to the application of electrical current. . If using the Patch, the LED light will turn off when treatment is complete and the patch can be removed.  Approximate treatment time is 1-3 hours.  Remove the patch when light goes off or after 6 hours. . The Patch can be worn during normal activity, however excessive motion where the electrodes have been placed can cause poor contact between the skin and the electrode or uneven electrical current resulting in greater risk of skin irritation. Marland Kitchen Keep out of the reach of children.   . DO NOT use if you have a cardiac pacemaker or any other electrically sensitive implanted device. . DO NOT use if you have a known sensitivity to dexamethasone. . DO NOT use during Magnetic Resonance Imaging (MRI). . DO NOT use over broken or compromised skin (e.g. sunburn, cuts, or acne) due to the increased risk of skin reaction. . DO  NOT SHAVE over the area to be treated:  To establish good contact between the Patch and the skin, excessive hair may be clipped. . DO NOT place the Patch or electrodes on or over your eyes, directly over your heart, or brain. . DO NOT reuse the Patch or electrodes as this may cause burns to occur.  Access Code: [email or web address] URL: [email or web address] Date: 11/17/2019 Prepared by: Eulis Foster  Exercises Supine Hamstring Stretch with Strap - 2 x daily - 7 x weekly - 3 reps - 1 sets - 30 sec hold Seated Hamstring Stretch - 2 x daily - 7 x weekly - 3 reps - 1 sets - 30 sec hold Standing Glute Med Mobilization with Small Ball on Wall - 2 x daily - 7 x weekly - 1 reps - 1 sets - 2-3 min hold Hooklying Single Leg Bent Knee Fallouts with Resistance - 2 x daily - 7 x weekly - 10 reps - 1 sets Seated Figure 4 Piriformis Stretch - 1 x daily - 7 x weekly - 1 sets - 2 reps  Patient Education Trigger [facility] Dry Needling [facility] [address] [place], [place] [address] Phone # [phone] Fax [phone]

## 2019-11-20 IMAGING — US US SOFT TISSUE HEAD/NECK
1 series · 14 of 16 positions shown · non-contrast
Comparison: None.

CLINICAL DATA: Right supraclavicular fullness

EXAM:
ULTRASOUND OF HEAD/NECK SOFT TISSUES
TECHNIQUE: Ultrasound examination of the head and neck soft tissues was
performed in the area of clinical concern.

[Series 1: us soft tissue head/neck · 0.07mm/px · 14 of 16 slices shown]
[im 1/16]
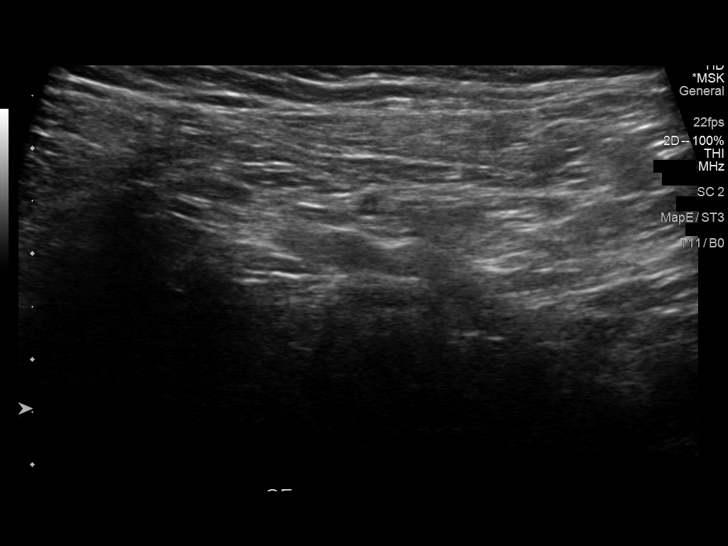
[im 2/16]
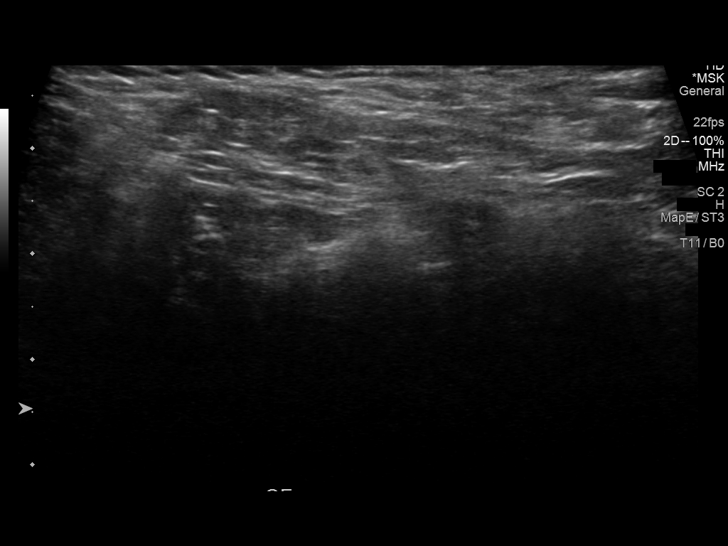
[im 3/16]
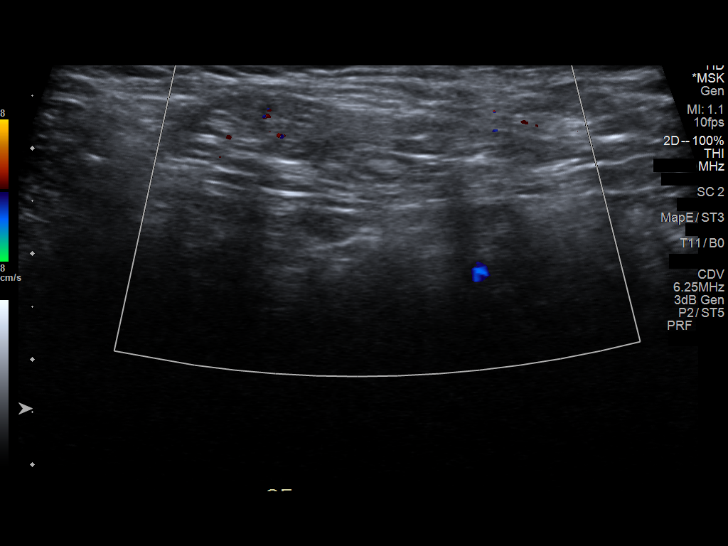
[im 5/16]
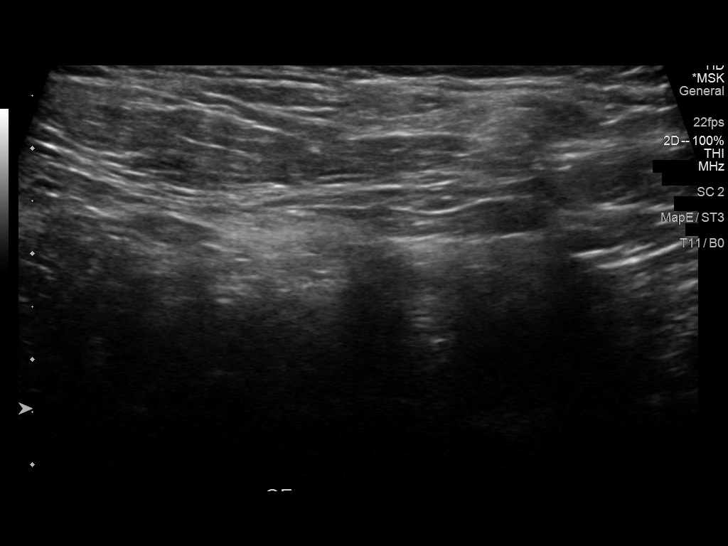
[im 6/16]
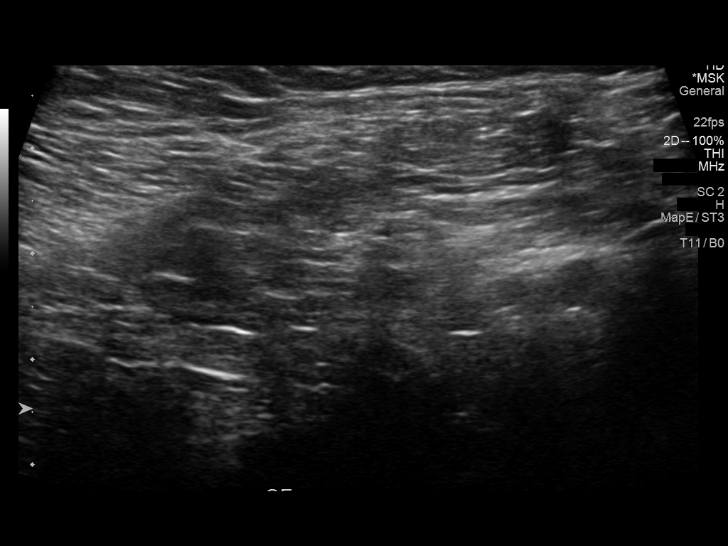
[im 7/16]
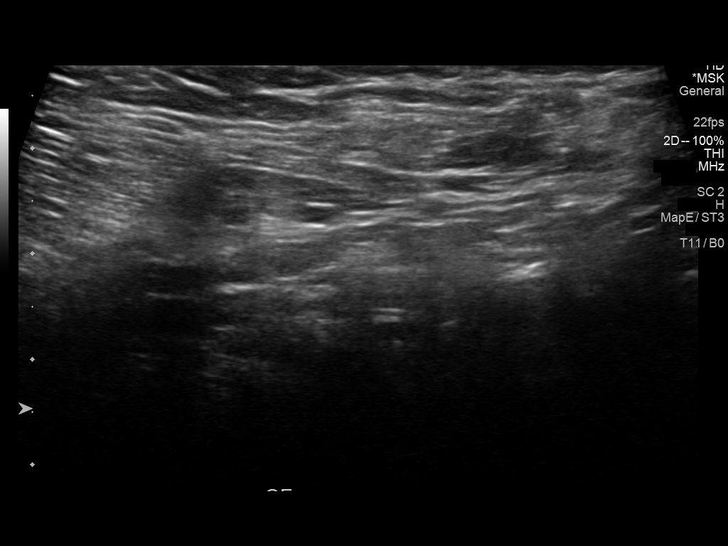
[im 8/16]
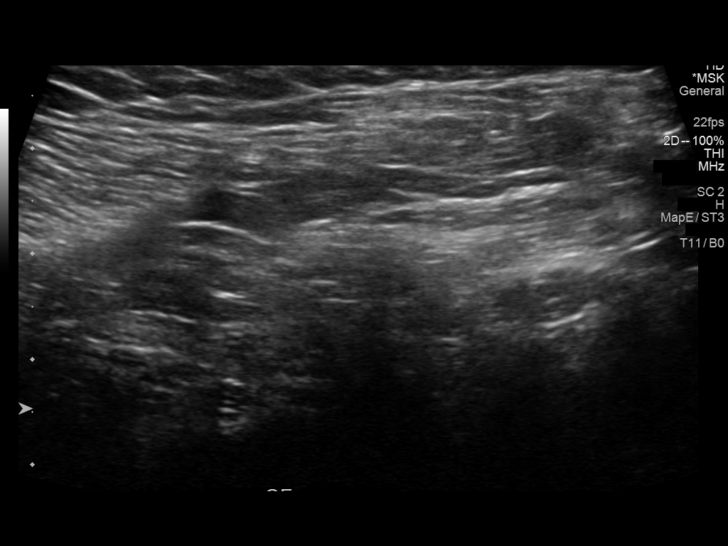
[im 9/16]
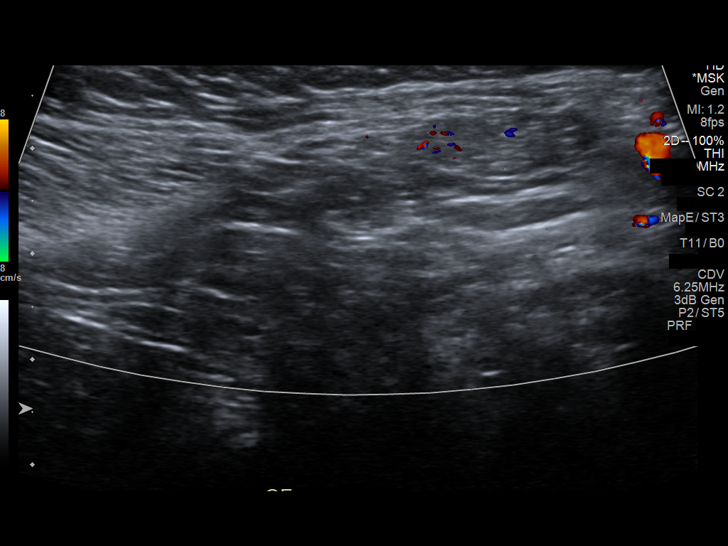
[im 10/16]
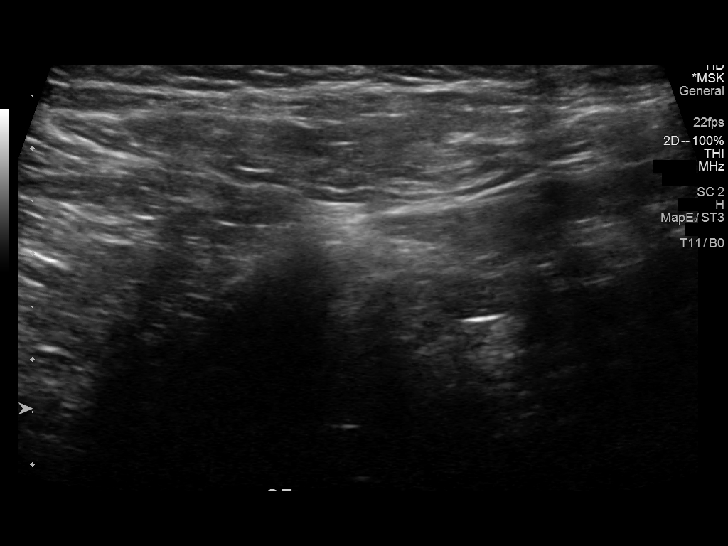
[im 11/16]
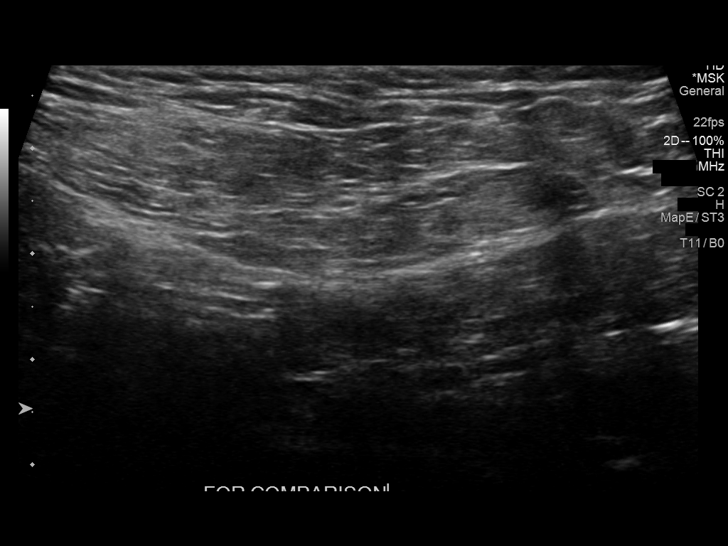
[im 13/16]
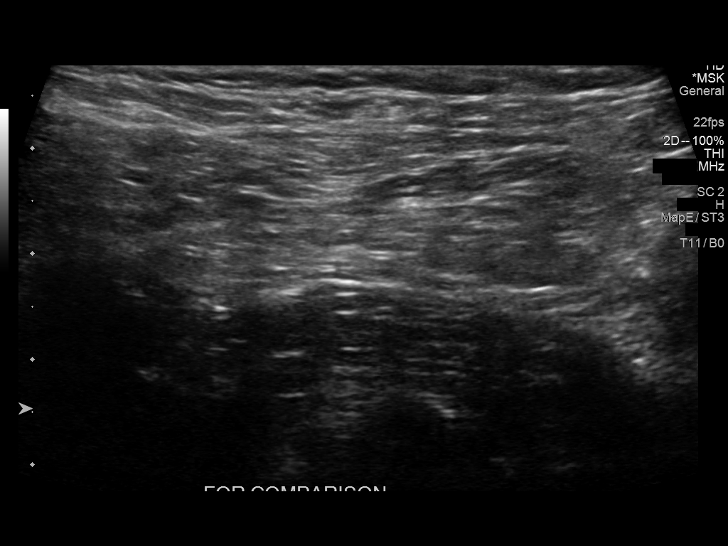
[im 14/16]
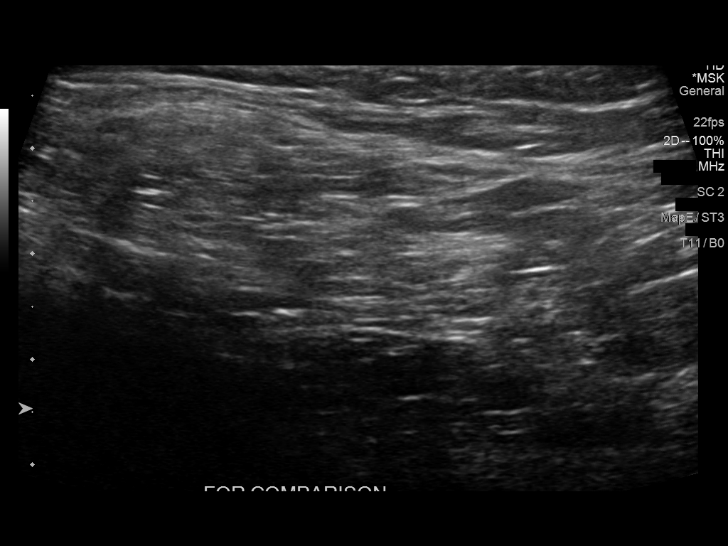
[im 15/16]
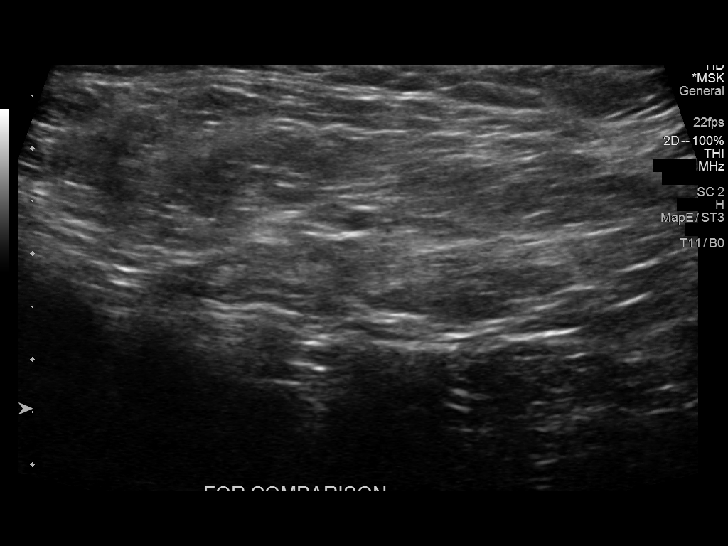
[im 16/16]
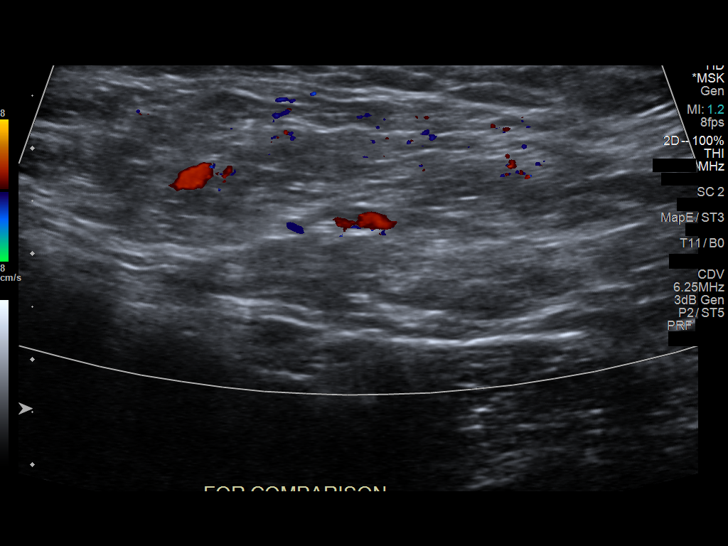

[14 of 16 positions shown; findings below may reference images not displayed]

FINDINGS: Superficial soft tissue ultrasound performed of the right
supraclavicular area of concern and compared with the left side.

In the area of concern, there is no right supraclavicular soft
tissue abnormality, adenopathy, mass, cyst, fluid collection,
hemorrhage or hematoma. Comparison left supraclavicular area appears
similar.
IMPRESSION: No significant supraclavicular abnormality by soft tissue
ultrasound.

## 2019-11-22 ENCOUNTER — Other Ambulatory Visit: Payer: Self-pay

## 2019-11-22 ENCOUNTER — Encounter: Payer: Self-pay | Admitting: Physical Therapy

## 2019-11-22 ENCOUNTER — Ambulatory Visit: Payer: Medicare Other | Admitting: Physical Therapy

## 2019-11-22 ENCOUNTER — Ambulatory Visit: Payer: Medicare Other

## 2019-11-22 DIAGNOSIS — R252 Cramp and spasm: Secondary | ICD-10-CM

## 2019-11-22 DIAGNOSIS — R29898 Other symptoms and signs involving the musculoskeletal system: Secondary | ICD-10-CM | POA: Diagnosis not present

## 2019-11-22 DIAGNOSIS — M25551 Pain in right hip: Secondary | ICD-10-CM | POA: Diagnosis not present

## 2019-11-22 DIAGNOSIS — M6281 Muscle weakness (generalized): Secondary | ICD-10-CM

## 2019-11-22 NOTE — Therapy (Signed)
[facility] [address] [place], [place], [address] Phone: [phone]   Fax:  [phone]  Physical Therapy Treatment  Patient Details  Name: Andrea Kelley MRN: [id number] Date of Birth: 1941/03/21 Referring Provider (PT): Dr. Eunice Blase   Encounter Date: 11/22/2019  PT End of Session - 11/22/19 1531    Visit Number  2    Date for PT Re-Evaluation  01/12/20    Authorization Type  BCBS Medicare    PT Start Time  6962    PT Stop Time  1527    PT Time Calculation (min)  40 min    Activity Tolerance  Patient tolerated treatment well;No increased pain    Behavior During Therapy  WFL for tasks assessed/performed       Past Medical History:  Diagnosis Date  . DDD (degenerative disc disease), lumbar   . Hyperlipidemia   . Osteopenia    [place] [facility]  . Syncope     Past Surgical History:  Procedure Laterality Date  . ABDOMINAL HYSTERECTOMY     For dysfunctional bleeding; no BSO  . BACK SURGERY  1970   ruptured disc ; Dr Quentin Cornwall  . COLONOSCOPY  2001   Negative; done for rectal bleeding  . LOOP RECORDER INSERTION N/A 02/16/2017   Procedure: Loop Recorder Insertion;  Surgeon: Sanda Klein, MD;  Location: [facility];  Service: Cardiovascular;  Laterality: N/A;    There were no vitals filed for this visit.  Subjective Assessment - 11/22/19 1453    Subjective  My right hip felt better after the treatment.    Patient Stated Goals  get rid of pain in right leg    Currently in Pain?  Yes    Pain Score  7     Pain Location  Hip    Pain Orientation  Right;Lateral    Pain Descriptors / Indicators  Sharp;Shooting    Pain Type  Chronic pain    Pain Onset  More than a month ago    Aggravating Factors   walking and standing a long time                       [facility] Adult PT Treatment/Exercise - 11/22/19 0001      Lumbar Exercises: Stretches   Single Knee to Chest Stretch  Right;Left;2  reps;10 seconds    Standing Side Bend Limitations  sitting side bend to the left    Piriformis Stretch  Right;Left;1 rep;30 seconds    Piriformis Stretch Limitations  supine      Lumbar Exercises: Supine   Clam  20 reps    Clam Limitations  green band      Lumbar Exercises: Sidelying   Clam  Right;20 reps    Clam Limitations  10x before DN with some pain; 10x after pain free      Iontophoresis   Type of Iontophoresis  Dexamethasone    Location  right gluteal    Dose  1 ml, #2    Time  4-6 hour release      Manual Therapy   Manual Therapy  Soft tissue mobilization;Muscle Energy Technique    Soft tissue mobilization  right gluteal to elongate the muscle after dry needling    Muscle Energy Technique  correct right ilium       Trigger Point Dry Needling - 11/22/19 0001    Muscles Treated Back/Hip  Piriformis    Gluteus Minimus Response  Twitch response elicited;Palpable increased muscle length    Gluteus Medius Response  Twitch response elicited;Palpable increased muscle length    Gluteus Maximus Response  Palpable increased muscle length;Twitch response elicited    Piriformis Response  Twitch response elicited;Palpable increased muscle length           PT Education - 11/22/19 1531    Education Details  Access Code: [email or web address]    Person(s) Educated  Patient    Methods  Explanation;Demonstration;Verbal cues;Handout    Comprehension  Verbalized understanding;Returned demonstration       PT Short Term Goals - 11/17/19 1708      PT SHORT TERM GOAL #1   Title  independent with initial HEP    Time  4    Period  Weeks    Status  New    Target Date  12/15/19      PT SHORT TERM GOAL #2   Title  right hip pain decreased >/= 25% with walking her dog and sitting    Time  4    Period  Weeks    Status  New    Target Date  12/15/19      PT SHORT TERM GOAL #3   Title  patient right ilium is in correct alignment for 4 weeks    Time  4    Period  Weeks    Status  New     Target Date  12/15/19        PT Long Term Goals - 11/17/19 1709      PT LONG TERM GOAL #1   Title  independent with HEP    Time  8    Period  Weeks    Status  New    Target Date  01/12/20      PT LONG TERM GOAL #2   Title  report ability to walk dog for at least 3/4 mile without increase in pain for improved function    Time  8    Period  Weeks    Status  New    Target Date  01/12/20      PT LONG TERM GOAL #3   Title  report ability to stand > 15 min without increase in pain for improved function    Time  8    Period  Weeks    Status  New    Target Date  01/12/20      PT LONG TERM GOAL #4   Title  demonstrate at least 4/5 bil hip abduction strength for improved function and decreased risk of reinjury    Time  8    Period  Weeks    Status  New    Target Date  01/12/20      PT LONG TERM GOAL #5   Title  FOTO score </= 24% limitation    Time  8    Period  Weeks    Status  New    Target Date  01/12/20            Plan - 11/22/19 1532    Clinical Impression Statement  Pt felt good after last treatment.  Pt had dry needling again today and responded well with decreased pain doing the clamshell after manaul techniques.  Pt had right ilium rotation to the front corrected partially with MET.  Pt demonstrates improved trunk posture after sidebending to the right.  continue to address postural impairments and hip strength.    PT Treatment/Interventions  Cryotherapy;Iontophoresis '4mg'$ /ml Dexamethasone;Moist  Heat;Neuromuscular re-education;Therapeutic exercise;Therapeutic activities;Patient/family education;Manual techniques;Dry needling;Taping;Joint Manipulations    PT Next Visit Plan  dry needling to the right gluteal as needed, ionto #3, soft tissue work to right gluteal, f/u on right ilium correction, right hip abduction and extension strength; Rt thoracic sidebending and Lt thoracic rotation    PT Home Exercise Plan  Access Code: [email or web address]    Consulted and Agree with Plan  of Care  Patient       Patient will benefit from skilled therapeutic intervention in order to improve the following deficits and impairments:  Abnormal gait, Decreased range of motion, Increased fascial restricitons, Increased muscle spasms, Pain, Decreased mobility, Decreased strength, Decreased activity tolerance  Visit Diagnosis: Pain in right hip  Muscle weakness (generalized)  Cramp and spasm  Other symptoms and signs involving the musculoskeletal system     Problem List Patient Active Problem List   Diagnosis Date Noted  . Routine general medical examination at a health care facility 05/27/2018  . Essential (primary) hypertension 05/20/2017  . Syncope 02/09/2017  . DDD (degenerative disc disease), lumbar 01/16/2011  . RESTLESS LEG SYNDROME 06/07/2009  . Hyperglycemia 06/07/2009  . Hyperlipidemia 07/19/2007    Jule Ser, PT 11/22/2019, 4:13 PM  [facility] [address], [place], [place], [address] Phone: [phone]   Fax:  [phone]  Name: Andrea Kelley MRN: [id number] Date of Birth: [birth year]

## 2019-11-22 NOTE — Patient Instructions (Signed)
Access Code: [email or web address] URL: [email or web address] Date: 11/22/2019 Prepared by: Dwana Curd  Exercises Supine Hamstring Stretch with Strap - 2 x daily - 7 x weekly - 3 reps - 1 sets - 30 sec hold Seated Hamstring Stretch - 2 x daily - 7 x weekly - 3 reps - 1 sets - 30 sec hold Standing Glute Med Mobilization with Small Ball on Wall - 2 x daily - 7 x weekly - 1 reps - 1 sets - 2-3 min hold Hooklying Single Leg Bent Knee Fallouts with Resistance - 2 x daily - 7 x weekly - 10 reps - 1 sets Supine Figure 4 Piriformis Stretch - 1 x daily - 7 x weekly - 3 reps - 1 sets - 30 sec hold Clam - 1 x daily - 7 x weekly - 10 reps - 3 sets Seated Sidebending Arms Overhead - 1 x daily - 7 x weekly - 1 sets - 5 reps - 10 sec hold  Patient Education Trigger Point Dry Needling

## 2019-11-29 ENCOUNTER — Other Ambulatory Visit: Payer: Self-pay

## 2019-11-29 ENCOUNTER — Ambulatory Visit: Payer: Medicare Other | Admitting: Physical Therapy

## 2019-11-29 ENCOUNTER — Encounter: Payer: Self-pay | Admitting: Physical Therapy

## 2019-11-29 DIAGNOSIS — R252 Cramp and spasm: Secondary | ICD-10-CM

## 2019-11-29 DIAGNOSIS — M25551 Pain in right hip: Secondary | ICD-10-CM | POA: Diagnosis not present

## 2019-11-29 DIAGNOSIS — M6281 Muscle weakness (generalized): Secondary | ICD-10-CM | POA: Diagnosis not present

## 2019-11-29 DIAGNOSIS — R29898 Other symptoms and signs involving the musculoskeletal system: Secondary | ICD-10-CM | POA: Diagnosis not present

## 2019-11-29 NOTE — Therapy (Signed)
[facility] [address], [address] [place], [place], [address] Phone: [phone]   Fax:  [phone]  Physical Therapy Treatment  Patient Details  Name: Andrea Kelley MRN: [id number] Date of Birth: 06-Feb-1941 Referring Provider (PT): Dr. Lavada Mesi   Encounter Date: 11/29/2019  PT End of Session - 11/29/19 0759    Visit Number  3    Date for PT Re-Evaluation  01/12/20    Authorization Type  BCBS Medicare    PT Start Time  0759    PT Stop Time  0840    PT Time Calculation (min)  41 min    Activity Tolerance  Patient tolerated treatment well;No increased pain    Behavior During Therapy  WFL for tasks assessed/performed       Past Medical History:  Diagnosis Date  . DDD (degenerative disc disease), lumbar   . Hyperlipidemia   . Osteopenia    Marianna Elam  . Syncope     Past Surgical History:  Procedure Laterality Date  . ABDOMINAL HYSTERECTOMY     For dysfunctional bleeding; no BSO  . BACK SURGERY  1970   ruptured disc ; Dr Roxan Hockey  . COLONOSCOPY  2001   Negative; done for rectal bleeding  . LOOP RECORDER INSERTION N/A 02/16/2017   Procedure: Loop Recorder Insertion;  Surgeon: Thurmon Fair, MD;  Location: [facility];  Service: Cardiovascular;  Laterality: N/A;    There were no vitals filed for this visit.  Subjective Assessment - 11/29/19 0803    Subjective  I feel about the same because I did more walking when I traveled.  Pt states the previous treatment helped but it flared up after.    Patient Stated Goals  get rid of pain in right leg    Currently in Pain?  Yes    Pain Score  3     Pain Location  Hip    Pain Orientation  Right;Lateral    Pain Descriptors / Indicators  Sharp    Pain Type  Chronic pain    Pain Onset  More than a month ago    Pain Frequency  Intermittent    Aggravating Factors   walking    Multiple Pain Sites  No                       [facility] Adult PT  Treatment/Exercise - 11/29/19 0001      Lumbar Exercises: Stretches   Active Hamstring Stretch  Right;Left;1 rep;30 seconds    Active Hamstring Stretch Limitations  sitting    Hip Flexor Stretch  Right;Left;3 reps;30 seconds    Piriformis Stretch  Right;Left;1 rep;30 seconds    Piriformis Stretch Limitations  attempt sitting but still a little more painful       Lumbar Exercises: Standing   Other Standing Lumbar Exercises  marching tapping step with gluteal activation - 20x cues to keep pelvis neutral      Lumbar Exercises: Sidelying   Clam  Right;20 reps   pain free     Iontophoresis   Type of Iontophoresis  Dexamethasone    Location  right gluteal    Dose  1 ml, #3    Time  4-6 hour release      Manual Therapy   Manual Therapy  Soft tissue mobilization;Muscle Energy Technique    Soft tissue mobilization  right gluteal to elongate the muscle after dry needling     addaday to Rt  glutes  Trigger Point Dry Needling - 11/29/19 0001    Consent Given?  Yes    Education Handout Provided  Previously provided    Gluteus Minimus Response  Twitch response elicited;Palpable increased muscle length    Gluteus Medius Response  Twitch response elicited;Palpable increased muscle length    Gluteus Maximus Response  Palpable increased muscle length;Twitch response elicited             PT Short Term Goals - 11/17/19 1708      PT SHORT TERM GOAL #1   Title  independent with initial HEP    Time  4    Period  Weeks    Status  New    Target Date  12/15/19      PT SHORT TERM GOAL #2   Title  right hip pain decreased >/= 25% with walking her dog and sitting    Time  4    Period  Weeks    Status  New    Target Date  12/15/19      PT SHORT TERM GOAL #3   Title  patient right ilium is in correct alignment for 4 weeks    Time  4    Period  Weeks    Status  New    Target Date  12/15/19        PT Long Term Goals - 11/17/19 1709      PT LONG TERM GOAL #1   Title  independent  with HEP    Time  8    Period  Weeks    Status  New    Target Date  01/12/20      PT LONG TERM GOAL #2   Title  report ability to walk dog for at least 3/4 mile without increase in pain for improved function    Time  8    Period  Weeks    Status  New    Target Date  01/12/20      PT LONG TERM GOAL #3   Title  report ability to stand > 15 min without increase in pain for improved function    Time  8    Period  Weeks    Status  New    Target Date  01/12/20      PT LONG TERM GOAL #4   Title  demonstrate at least 4/5 bil hip abduction strength for improved function and decreased risk of reinjury    Time  8    Period  Weeks    Status  New    Target Date  01/12/20      PT LONG TERM GOAL #5   Title  FOTO score </= 24% limitation    Time  8    Period  Weeks    Status  New    Target Date  01/12/20            Plan - 11/29/19 0807    Clinical Impression Statement  Pt reported she did not have time to do the stretches since last visit and did more walking so her hip started hurting.  Pt progressed to standing exercises and was able to demonstrate improved pelvic stability with cues to activate glutes.  Pt had tight glutius med and max that release with dry needling andmanual techniques.  Pt will benefit from skilled PT to keep working on glute strength for improved gait and reduced trendelenburge on the Rt side.    PT Treatment/Interventions  Cryotherapy;Iontophoresis  4mg /ml Dexamethasone;Moist Heat;Neuromuscular re-education;Therapeutic exercise;Therapeutic activities;Patient/family education;Manual techniques;Dry needling;Taping;Joint Manipulations    PT Next Visit Plan  dry needling to the right gluteal as needed, ionto #4, soft tissue work to right gluteal, f/u on right ilium correction, Rt gluteal strength progression sidelying abduction and bridges    PT Home Exercise Plan  Access Code:    Consulted and Agree with Plan of Care  Patient       Patient will benefit  from skilled therapeutic intervention in order to improve the following deficits and impairments:  Abnormal gait, Decreased range of motion, Increased fascial restricitons, Increased muscle spasms, Pain, Decreased mobility, Decreased strength, Decreased activity tolerance  Visit Diagnosis: Pain in right hip  Muscle weakness (generalized)  Cramp and spasm     Problem List Patient Active Problem List   Diagnosis Date Noted  . Routine general medical examination at a health care facility 05/27/2018  . Essential (primary) hypertension 05/20/2017  . Syncope 02/09/2017  . DDD (degenerative disc disease), lumbar 01/16/2011  . RESTLESS LEG SYNDROME 06/07/2009  . Hyperglycemia 06/07/2009  . Hyperlipidemia 07/19/2007    14/03/2007, PT 11/29/2019, 9:31 AM  [facility] [address], [address] [place], [place], [place] Phone: [phone]   Fax:  [phone]  Name: Andrea Kelley MRN: Doralee Albino Date of Birth: 11/27/1940

## 2019-12-06 ENCOUNTER — Encounter: Payer: Self-pay | Admitting: Physical Therapy

## 2019-12-06 ENCOUNTER — Ambulatory Visit: Payer: Medicare Other | Admitting: Physical Therapy

## 2019-12-06 ENCOUNTER — Other Ambulatory Visit: Payer: Self-pay

## 2019-12-06 DIAGNOSIS — R252 Cramp and spasm: Secondary | ICD-10-CM

## 2019-12-06 DIAGNOSIS — M6281 Muscle weakness (generalized): Secondary | ICD-10-CM

## 2019-12-06 DIAGNOSIS — M25551 Pain in right hip: Secondary | ICD-10-CM | POA: Diagnosis not present

## 2019-12-06 DIAGNOSIS — R29898 Other symptoms and signs involving the musculoskeletal system: Secondary | ICD-10-CM

## 2019-12-06 NOTE — Therapy (Signed)
[facility] [address] [place], [place], [address] Phone: [phone]   Fax:  [phone]  Physical Therapy Treatment  Patient Details  Name: Andrea Kelley MRN: [id number] Date of Birth: 19-Jul-1941 Referring Provider (PT): Dr. Eunice Blase   Encounter Date: 12/06/2019  PT End of Session - 12/06/19 0852    Visit Number  4    Date for PT Re-Evaluation  01/12/20    Authorization Type  BCBS Medicare    PT Start Time  2831    PT Stop Time  0923    PT Time Calculation (min)  39 min    Activity Tolerance  Patient tolerated treatment well;No increased pain    Behavior During Therapy  WFL for tasks assessed/performed       Past Medical History:  Diagnosis Date  . DDD (degenerative disc disease), lumbar   . Hyperlipidemia   . Osteopenia    [place]anark Elam  . Syncope     Past Surgical History:  Procedure Laterality Date  . ABDOMINAL HYSTERECTOMY     For dysfunctional bleeding; no BSO  . BACK SURGERY  1970   ruptured disc ; Dr Quentin Cornwall  . COLONOSCOPY  2001   Negative; done for rectal bleeding  . LOOP RECORDER INSERTION N/A 02/16/2017   Procedure: Loop Recorder Insertion;  Surgeon: Sanda Klein, MD;  Location: [place];  Service: Cardiovascular;  Laterality: N/A;    There were no vitals filed for this visit.  Subjective Assessment - 12/06/19 0850    Subjective  The day after the treatment was bad, but I was able to have some good days after that.  I have been turning in bed with less pain.    Currently in Pain?  No/denies                       [facility] Adult PT Treatment/Exercise - 12/06/19 0001      Ambulation/Gait   Gait Comments  slight trendelenburge on Rt side noticed      Neuro Re-ed    Neuro Re-ed Details   cues to activate Rt glute for improved pelvic stability during gait - ambualated 68ft x 4 with gluteal activation      Lumbar Exercises: Aerobic   Nustep  L3 x 10 min  - PT present for status update      Lumbar Exercises: Supine   Clam  10 reps;1 second   single leg yellow band   Bent Knee Raise  10 reps;1 second    Bent Knee Raise Limitations  yellow band around thighs    Bridge  10 reps    Bridge Limitations  yellow band around thighs      Iontophoresis   Type of Iontophoresis  Dexamethasone    Location  right gluteal    Dose  1 ml, #4    Time  4-6 hour release      Manual Therapy   Manual Therapy  Soft tissue mobilization;Muscle Energy Technique    Soft tissue mobilization  right gluteal to elongate the muscle after dry needling       Trigger Point Dry Needling - 12/06/19 0001    Gluteus Minimus Response  Palpable increased muscle length    Gluteus Medius Response  Palpable increased muscle length             PT Short Term Goals - 12/06/19 0853      PT SHORT TERM GOAL #1  Title  independent with initial HEP    Status  Achieved      PT SHORT TERM GOAL #2   Title  right hip pain decreased >/= 25% with walking her dog and sitting    Baseline  50%    Status  Achieved      PT SHORT TERM GOAL #3   Title  patient right ilium is in correct alignment for 4 weeks        PT Long Term Goals - 11/17/19 1709      PT LONG TERM GOAL #1   Title  independent with HEP    Time  8    Period  Weeks    Status  New    Target Date  01/12/20      PT LONG TERM GOAL #2   Title  report ability to walk dog for at least 3/4 mile without increase in pain for improved function    Time  8    Period  Weeks    Status  New    Target Date  01/12/20      PT LONG TERM GOAL #3   Title  report ability to stand > 15 min without increase in pain for improved function    Time  8    Period  Weeks    Status  New    Target Date  01/12/20      PT LONG TERM GOAL #4   Title  demonstrate at least 4/5 bil hip abduction strength for improved function and decreased risk of reinjury    Time  8    Period  Weeks    Status  New    Target Date  01/12/20       PT LONG TERM GOAL #5   Title  FOTO score </= 24% limitation    Time  8    Period  Weeks    Status  New    Target Date  01/12/20            Plan - 12/06/19 5537    Clinical Impression Statement  Pt did well with today'streatment.  She was able to demonstrate improved pelivc stability with exercises and during gait when given verbal and tactile cues.  Cues for keeping core engaged in sidelying and supine and activating gluteals standing/walking.  Pt will benefit from skilled PT to contineu to work on improved pelvic alignment and core and gluteal strength.    PT Treatment/Interventions  Cryotherapy;Iontophoresis 4mg /ml Dexamethasone;Moist Heat;Neuromuscular re-education;Therapeutic exercise;Therapeutic activities;Patient/family education;Manual techniques;Dry needling;Taping;Joint Manipulations    PT Next Visit Plan  dry needling to the right gluteal as needed, ionto #5, soft tissue work to right gluteal, f/u on right ilium correction, Rt gluteal and core strength progression sidelying abduction and bridges    PT Home Exercise Plan  Access Code:    Consulted and Agree with Plan of Care  Patient       Patient will benefit from skilled therapeutic intervention in order to improve the following deficits and impairments:  Abnormal gait, Decreased range of motion, Increased fascial restricitons, Increased muscle spasms, Pain, Decreased mobility, Decreased strength, Decreased activity tolerance  Visit Diagnosis: Pain in right hip  Muscle weakness (generalized)  Cramp and spasm  Other symptoms and signs involving the musculoskeletal system     Problem List Patient Active Problem List   Diagnosis Date Noted  . Routine general medical examination at a health care facility 05/27/2018  . Essential (primary) hypertension 05/20/2017  .  Syncope 02/09/2017  . DDD (degenerative disc disease), lumbar 01/16/2011  . RESTLESS LEG SYNDROME 06/07/2009  . Hyperglycemia 06/07/2009  .  Hyperlipidemia 07/19/2007    Brayton Caves Romell Cavanah 12/06/2019, 9:30 AM  [facility] [address] [place], [place], [address] Phone: [phone]   Fax:  [phone]  Name: CHRISELDA LEPPERT MRN: [id number] Date of Birth: Mar 04, 1941

## 2019-12-07 DIAGNOSIS — L237 Allergic contact dermatitis due to plants, except food: Secondary | ICD-10-CM | POA: Diagnosis not present

## 2019-12-07 DIAGNOSIS — L718 Other rosacea: Secondary | ICD-10-CM | POA: Diagnosis not present

## 2019-12-08 ENCOUNTER — Ambulatory Visit: Payer: Medicare Other | Admitting: Physical Therapy

## 2019-12-08 ENCOUNTER — Encounter: Payer: Self-pay | Admitting: Physical Therapy

## 2019-12-08 ENCOUNTER — Other Ambulatory Visit: Payer: Self-pay

## 2019-12-08 DIAGNOSIS — M25551 Pain in right hip: Secondary | ICD-10-CM

## 2019-12-08 DIAGNOSIS — R29898 Other symptoms and signs involving the musculoskeletal system: Secondary | ICD-10-CM | POA: Diagnosis not present

## 2019-12-08 DIAGNOSIS — R252 Cramp and spasm: Secondary | ICD-10-CM | POA: Diagnosis not present

## 2019-12-08 DIAGNOSIS — M6281 Muscle weakness (generalized): Secondary | ICD-10-CM | POA: Diagnosis not present

## 2019-12-08 NOTE — Therapy (Signed)
[facility] [address] [place], [place], [address] Phone: [phone]   Fax:  [phone]  Physical Therapy Treatment  Patient Details  Name: Andrea Kelley MRN: [id number] Date of Birth: 08/10/1941 Referring Provider (PT): Dr. Eunice Kelley   Encounter Date: 12/08/2019  PT End of Session - 12/08/19 0853    Visit Number  5    Date for PT Re-Evaluation  01/12/20    Authorization Type  BCBS Medicare    PT Start Time  9242    PT Stop Time  0929    PT Time Calculation (min)  42 min    Activity Tolerance  Patient tolerated treatment well;No increased pain    Behavior During Therapy  WFL for tasks assessed/performed       Past Medical History:  Diagnosis Date  . DDD (degenerative disc disease), lumbar   . Hyperlipidemia   . Osteopenia    Andrea Kelley  . Syncope     Past Surgical History:  Procedure Laterality Date  . ABDOMINAL HYSTERECTOMY     For dysfunctional bleeding; no BSO  . BACK SURGERY  1970   ruptured disc ; Dr Andrea Kelley  . COLONOSCOPY  2001   Negative; done for rectal bleeding  . LOOP RECORDER INSERTION N/A 02/16/2017   Procedure: Loop Recorder Insertion;  Surgeon: Andrea Klein, MD;  Location: [facility];  Service: Cardiovascular;  Laterality: N/A;    There were no vitals filed for this visit.  Subjective Assessment - 12/08/19 0852    Subjective  Pt states yesterday was a bad day but today is better so far.    Patient Stated Goals  get rid of pain in right leg    Currently in Pain?  No/denies                       [facility] Adult PT Treatment/Exercise - 12/08/19 0001      Lumbar Exercises: Aerobic   Nustep  L3 x 10 min - PT present for status update      Lumbar Exercises: Standing   Other Standing Lumbar Exercises  hip ext and ext to the diagonal Rt side with patient touching glute med to provide feedback; she felt hamstring being used more during these exercises       Lumbar Exercises: Supine   Ab Set  10 reps   ball press   Clam  1 second;20 reps   single leg green band   Bridge  20 reps    Bridge Limitations  green band around thighs    Bridge with clamshell  10 reps   green band   Large Ball Abdominal Isometric  20 reps    Large Ball Abdominal Isometric Limitations  LE and UE roll outs      Lumbar Exercises: Sidelying   Clam  Right;20 reps;Left   pain free; yellow band     Iontophoresis   Type of Iontophoresis  Dexamethasone    Location  right gluteal    Dose  1 ml, #5    Time  4-6 hour release               PT Short Term Goals - 12/06/19 0853      PT SHORT TERM GOAL #1   Title  independent with initial HEP    Status  Achieved      PT SHORT TERM GOAL #2   Title  right hip pain decreased >/= 25%  with walking her dog and sitting    Baseline  50%    Status  Achieved      PT SHORT TERM GOAL #3   Title  patient right ilium is in correct alignment for 4 weeks        PT Long Term Goals - 11/17/19 1709      PT LONG TERM GOAL #1   Title  independent with HEP    Time  8    Period  Weeks    Status  New    Target Date  01/12/20      PT LONG TERM GOAL #2   Title  report ability to walk dog for at least 3/4 mile without increase in pain for improved function    Time  8    Period  Weeks    Status  New    Target Date  01/12/20      PT LONG TERM GOAL #3   Title  report ability to stand > 15 min without increase in pain for improved function    Time  8    Period  Weeks    Status  New    Target Date  01/12/20      PT LONG TERM GOAL #4   Title  demonstrate at least 4/5 bil hip abduction strength for improved function and decreased risk of reinjury    Time  8    Period  Weeks    Status  New    Target Date  01/12/20      PT LONG TERM GOAL #5   Title  FOTO score </= 24% limitation    Time  8    Period  Weeks    Status  New    Target Date  01/12/20            Plan - 12/08/19 0934    Clinical Impression  Statement  Pt did well with exercises today and no increased pain during treatment.  Pt was guided with focus on activating glutes and core during exercises today.  Pt was able to increased resistance.  She appeared to be challenged by exercises and will benefit from skilled PT to continue to work on progressing core and hip strength.    PT Treatment/Interventions  Cryotherapy;Iontophoresis 4mg /ml Dexamethasone;Moist Heat;Neuromuscular re-education;Therapeutic exercise;Therapeutic activities;Patient/family education;Manual techniques;Dry needling;Taping;Joint Manipulations    PT Next Visit Plan  dry needling to the right gluteal as needed, ionto #5, soft tissue work to right glutea, Rt gluteal and core strength progression    PT Home Exercise Plan  Access Code: [email or web address]    Consulted and Agree with Plan of Care  Patient       Patient will benefit from skilled therapeutic intervention in order to improve the following deficits and impairments:  Abnormal gait, Decreased range of motion, Increased fascial restricitons, Increased muscle spasms, Pain, Decreased mobility, Decreased strength, Decreased activity tolerance  Visit Diagnosis: Pain in right hip  Muscle weakness (generalized)  Cramp and spasm  Other symptoms and signs involving the musculoskeletal system     Problem List Patient Active Problem List   Diagnosis Date Noted  . Routine general medical examination at a health care facility 05/27/2018  . Essential (primary) hypertension 05/20/2017  . Syncope 02/09/2017  . DDD (degenerative disc disease), lumbar 01/16/2011  . RESTLESS LEG SYNDROME 06/07/2009  . Hyperglycemia 06/07/2009  . Hyperlipidemia 07/19/2007    14/03/2007, PT 12/08/2019, 9:40 AM  [facility] [address] [place], [place], [address] Phone: [phone]   Fax:  [phone]  Name: Andrea Kelley MRN: [id number] Date of Birth:  1940/10/24

## 2019-12-13 ENCOUNTER — Other Ambulatory Visit: Payer: Self-pay

## 2019-12-13 ENCOUNTER — Ambulatory Visit: Payer: Medicare Other | Attending: Family Medicine | Admitting: Physical Therapy

## 2019-12-13 DIAGNOSIS — M6281 Muscle weakness (generalized): Secondary | ICD-10-CM | POA: Diagnosis not present

## 2019-12-13 DIAGNOSIS — M25551 Pain in right hip: Secondary | ICD-10-CM | POA: Diagnosis not present

## 2019-12-13 DIAGNOSIS — R252 Cramp and spasm: Secondary | ICD-10-CM | POA: Diagnosis not present

## 2019-12-13 DIAGNOSIS — R29898 Other symptoms and signs involving the musculoskeletal system: Secondary | ICD-10-CM | POA: Diagnosis not present

## 2019-12-13 NOTE — Patient Instructions (Signed)
Access Code: [email or web address] Access Code: [email or web address] URL: [email or web address] Date: 12/13/2019 Prepared by: Lavinia Sharps  Exercises Supine Hamstring Stretch with Strap - 2 x daily - 7 x weekly - 3 reps - 1 sets - 30 sec hold Seated Hamstring Stretch - 2 x daily - 7 x weekly - 3 reps - 1 sets - 30 sec hold Standing Glute Med Mobilization with Small Ball on Wall - 2 x daily - 7 x weekly - 1 reps - 1 sets - 2-3 min hold Hooklying Single Leg Bent Knee Fallouts with Resistance - 2 x daily - 7 x weekly - 10 reps - 1 sets Supine Figure 4 Piriformis Stretch - 1 x daily - 7 x weekly - 3 reps - 1 sets - 30 sec hold Clam - 1 x daily - 7 x weekly - 10 reps - 3 sets Seated Sidebending Arms Overhead - 1 x daily - 7 x weekly - 1 sets - 5 reps - 10 sec hold Beginner Bridge - 1 x daily - 7 x weekly - 10 reps - 3 sets Sit to Stand - 1 x daily - 7 x weekly - 1 sets - 10 reps Single Leg Stance with Support - 1 x daily - 7 x weekly - 1 sets - 10 reps  Patient Education Trigger Point Dry Needling

## 2019-12-13 NOTE — Therapy (Signed)
[facility] [address], [place], [place], [address] Phone: [phone]   Fax:  [phone]  Physical Therapy Treatment  Patient Details  Name: Andrea Kelley MRN: [id number] Date of Birth: 1940/11/02 Referring Provider (PT): Dr. Eunice Blase   Encounter Date: 12/13/2019  PT End of Session - 12/13/19 1459    Visit Number  6    Date for PT Re-Evaluation  01/12/20    Authorization Type  BCBS Medicare    PT Start Time  0845    PT Stop Time  0930    PT Time Calculation (min)  45 min    Activity Tolerance  Patient tolerated treatment well;No increased pain       Past Medical History:  Diagnosis Date  . DDD (degenerative disc disease), lumbar   . Hyperlipidemia   . Osteopenia    [facility]  . Syncope     Past Surgical History:  Procedure Laterality Date  . ABDOMINAL HYSTERECTOMY     For dysfunctional bleeding; no BSO  . BACK SURGERY  1970   ruptured disc ; Dr Quentin Cornwall  . COLONOSCOPY  2001   Negative; done for rectal bleeding  . LOOP RECORDER INSERTION N/A 02/16/2017   Procedure: Loop Recorder Insertion;  Surgeon: Sanda Klein, MD;  Location: [facility];  Service: Cardiovascular;  Laterality: N/A;    There were no vitals filed for this visit.  Subjective Assessment - 12/13/19 0848    Subjective  No pain yet.  The mornings are usually OK, it's the walking that brings it on.  Night time brings it on.  The Dn helps the day after and overall it's improving.  Better with getting in/out of the car.    Currently in Pain?  No/denies    Pain Score  0-No pain    Pain Location  Hip    Pain Orientation  Right    Pain Type  Chronic pain                       [facility] Adult PT Treatment/Exercise - 12/13/19 0001      Lumbar Exercises: Aerobic   Nustep  L3 x 10 min - PT present for status update      Lumbar Exercises: Standing   Other Standing Lumbar Exercises  glute med stand tall against  wall 10x right/left       Knee/Hip Exercises: Standing   Gait Training  pt demo gait with attention to heel strike to activate gluteals       Knee/Hip Exercises: Seated   Sit to Sand  10 reps;without UE support   with emphasis on driving heel into the floor     Iontophoresis   Type of Iontophoresis  -- Not done today   Location  --    Dose  --    Time  --      Manual Therapy   Joint Mobilization  right long leg distraction grade 3 5x; hip inferior mob grade 3 5x     Soft tissue mobilization  gluteals, piriformis        Trigger Point Dry Needling - 12/13/19 0001    Consent Given?  Yes    Gluteus Minimus Response  Palpable increased muscle length    Gluteus Medius Response  Palpable increased muscle length    Gluteus Maximus Response  Palpable increased muscle length;Twitch response elicited    Piriformis Response  Twitch response elicited;Palpable increased muscle length  PT Education - 12/13/19 1458    Education Details  stand tall glute med activation; SLS, sit to stand with glute activation    Person(s) Educated  Patient    Methods  Explanation;Demonstration;Handout    Comprehension  Returned demonstration;Verbalized understanding       PT Short Term Goals - 12/13/19 1510      PT SHORT TERM GOAL #1   Title  independent with initial HEP    Status  Achieved      PT SHORT TERM GOAL #2   Title  right hip pain decreased >/= 25% with walking her dog and sitting    Status  Achieved      PT SHORT TERM GOAL #3   Title  patient right ilium is in correct alignment for 4 weeks    Status  Achieved        PT Long Term Goals - 11/17/19 1709      PT LONG TERM GOAL #1   Title  independent with HEP    Time  8    Period  Weeks    Status  New    Target Date  01/12/20      PT LONG TERM GOAL #2   Title  report ability to walk dog for at least 3/4 mile without increase in pain for improved function    Time  8    Period  Weeks    Status  New    Target Date   01/12/20      PT LONG TERM GOAL #3   Title  report ability to stand > 15 min without increase in pain for improved function    Time  8    Period  Weeks    Status  New    Target Date  01/12/20      PT LONG TERM GOAL #4   Title  demonstrate at least 4/5 bil hip abduction strength for improved function and decreased risk of reinjury    Time  8    Period  Weeks    Status  New    Target Date  01/12/20      PT LONG TERM GOAL #5   Title  FOTO score </= 24% limitation    Time  8    Period  Weeks    Status  New    Target Date  01/12/20            Plan - 12/13/19 6578    Clinical Impression Statement  The patient notes functional improvements including greater ease with getting in/out of the car and decreased intensity and frequency of symptoms.  She is able to perform a progression of glute medius focused muscle strengthening with verbal and tactile cues.  She responds well with the cue to "dig your heel into the floor."  Several tender points identified in gluteals and piriformis with improved soft tissue length noted following treatment session.  All STGs met.    Comorbidities  Osteopenia; Back surgery 1970, loop reader insertion of the left chest    Examination-Participation Restrictions  Laundry;Yard Work;Community Activity;Shop    Rehab Potential  Excellent    PT Frequency  2x / week    PT Duration  8 weeks    PT Treatment/Interventions  Cryotherapy;Iontophoresis '4mg'$ /ml Dexamethasone;Moist Heat;Neuromuscular re-education;Therapeutic exercise;Therapeutic activities;Patient/family education;Manual techniques;Dry needling;Taping;Joint Manipulations    PT Next Visit Plan  assess response to dry needling, ionto #6, soft tissue work to right glute, Rt gluteal and core strength progression; right  hip mobs    PT Home Exercise Plan  Access Code: [email or web address]       Patient will benefit from skilled therapeutic intervention in order to improve the following deficits and impairments:  Abnormal  gait, Decreased range of motion, Increased fascial restricitons, Increased muscle spasms, Pain, Decreased mobility, Decreased strength, Decreased activity tolerance  Visit Diagnosis: Pain in right hip  Muscle weakness (generalized)  Cramp and spasm  Other symptoms and signs involving the musculoskeletal system     Problem List Patient Active Problem List   Diagnosis Date Noted  . Routine general medical examination at a health care facility 05/27/2018  . Essential (primary) hypertension 05/20/2017  . Syncope 02/09/2017  . DDD (degenerative disc disease), lumbar 01/16/2011  . RESTLESS LEG SYNDROME 06/07/2009  . Hyperglycemia 06/07/2009  . Hyperlipidemia 07/19/2007   Ruben Im, PT 12/13/19 3:12 PM Phone: [phone] Fax: [phone] Alvera Singh 12/13/2019, 3:11 PM  [facility] [address] [place], [place], [address] Phone: [phone]   Fax:  [phone]  Name: AKIRA PERUSSE MRN: [id number] Date of Birth: 24-Dec-1940

## 2019-12-15 ENCOUNTER — Ambulatory Visit: Payer: Medicare Other | Admitting: Physical Therapy

## 2019-12-15 ENCOUNTER — Other Ambulatory Visit: Payer: Self-pay

## 2019-12-15 DIAGNOSIS — M6281 Muscle weakness (generalized): Secondary | ICD-10-CM | POA: Diagnosis not present

## 2019-12-15 DIAGNOSIS — R29898 Other symptoms and signs involving the musculoskeletal system: Secondary | ICD-10-CM

## 2019-12-15 DIAGNOSIS — M25551 Pain in right hip: Secondary | ICD-10-CM

## 2019-12-15 DIAGNOSIS — R252 Cramp and spasm: Secondary | ICD-10-CM

## 2019-12-15 LAB — CUP PACEART REMOTE DEVICE CHECK
Date Time Interrogation Session: 20210505035818
Implantable Pulse Generator Implant Date: 20180709

## 2019-12-15 NOTE — Therapy (Signed)
[facility] [address] [place], [place], [address] Phone: [phone]   Fax:  [phone]  Physical Therapy Treatment  Patient Details  Name: Andrea Kelley MRN: [id number] Date of Birth: 02/07/1941 Referring Provider (PT): Dr. Lavada Mesi   Encounter Date: 12/15/2019  PT End of Session - 12/15/19 1945    Visit Number  7    Date for PT Re-Evaluation  01/12/20    Authorization Type  BCBS Medicare    PT Start Time  0845    PT Stop Time  0930    PT Time Calculation (min)  45 min    Activity Tolerance  Patient tolerated treatment well       Past Medical History:  Diagnosis Date  . DDD (degenerative disc disease), lumbar   . Hyperlipidemia   . Osteopenia    [facility]  . Syncope     Past Surgical History:  Procedure Laterality Date  . ABDOMINAL HYSTERECTOMY     For dysfunctional bleeding; no BSO  . BACK SURGERY  1970   ruptured disc ; Dr Roxan Hockey  . COLONOSCOPY  2001   Negative; done for rectal bleeding  . LOOP RECORDER INSERTION N/A 02/16/2017   Procedure: Loop Recorder Insertion;  Surgeon: Thurmon Fair, MD;  [facility];  Service: Cardiovascular;  Laterality: N/A;    There were no vitals filed for this visit.  Subjective Assessment - 12/15/19 1935    Subjective  That dry needling last time really helped!   I feel so much better.  Instant relief.    Currently in Pain?  No/denies    Pain Score  0-No pain    Pain Location  Hip    Pain Orientation  Right                       [facility] Adult PT Treatment/Exercise - 12/15/19 0001      Lumbar Exercises: Stretches   Active Hamstring Stretch  Right;Left;5 reps    Active Hamstring Stretch Limitations  on 2nd step    Figure 4 Stretch  3 reps;30 seconds    Figure 4 Stretch Limitations  seated    Other Lumbar Stretch Exercise  hip flexor stretch on 2nd step 10x right/left       Lumbar Exercises: Standing   Other  Standing Lumbar Exercises  hip abduction with ball on wall 5x 5 sec hold right/left     Other Standing Lumbar Exercises  hip extension on wall 5x 5 sec holds right /left       Lumbar Exercises: Sidelying   Clam  Right;20 reps   pain free     Knee/Hip Exercises: Standing   Other Standing Knee Exercises  single leg dead lift 10x holding 10# weight     Other Standing Knee Exercises  hip hinge with cane 3 points of contact;  10# dead lift to shin level       Iontophoresis   Type of Iontophoresis  Dexamethasone    Location  right gluteal    Dose  1 ml, #6    Time  4-6 hour release      Manual Therapy   Joint Mobilization  right long leg distraction grade 3 5x; hip inferior mob; AP in internal rotation grade 3 5x     Soft tissue mobilization  gluteals, piriformis     Muscle Energy Technique  contract relax piriformis 3x 5 sec  PT Short Term Goals - 12/13/19 1510      PT SHORT TERM GOAL #1   Title  independent with initial HEP    Status  Achieved      PT SHORT TERM GOAL #2   Title  right hip pain decreased >/= 25% with walking her dog and sitting    Status  Achieved      PT SHORT TERM GOAL #3   Title  patient right ilium is in correct alignment for 4 weeks    Status  Achieved        PT Long Term Goals - 11/17/19 1709      PT LONG TERM GOAL #1   Title  independent with HEP    Time  8    Period  Weeks    Status  New    Target Date  01/12/20      PT LONG TERM GOAL #2   Title  report ability to walk dog for at least 3/4 mile without increase in pain for improved function    Time  8    Period  Weeks    Status  New    Target Date  01/12/20      PT LONG TERM GOAL #3   Title  report ability to stand > 15 min without increase in pain for improved function    Time  8    Period  Weeks    Status  New    Target Date  01/12/20      PT LONG TERM GOAL #4   Title  demonstrate at least 4/5 bil hip abduction strength for improved function and decreased  risk of reinjury    Time  8    Period  Weeks    Status  New    Target Date  01/12/20      PT LONG TERM GOAL #5   Title  FOTO score </= 24% limitation    Time  8    Period  Weeks    Status  New    Target Date  01/12/20            Plan - 12/15/19 1945    Clinical Impression Statement  The patient reports signficant pain relief from DN.  With verbal cues, she is able to activate her gluteals and use good technique with hip hinge/dead lift.  Much improved hip joint mobility noted.  Therapist monitoring response with all treatment interventions.    Comorbidities  Osteopenia; Back surgery 1970, loop reader insertion of the left chest    Rehab Potential  Excellent    PT Duration  8 weeks    PT Treatment/Interventions  Cryotherapy;Iontophoresis 4mg /ml Dexamethasone;Moist Heat;Neuromuscular re-education;Therapeutic exercise;Therapeutic activities;Patient/family education;Manual techniques;Dry needling;Taping;Joint Manipulations    PT Next Visit Plan  DN to prepare for upcoming trip to [place];  ionto series complete;  right hip manual therapy;  glute strengthening    PT Home Exercise Plan  Access Code: [email or web address]       Patient will benefit from skilled therapeutic intervention in order to improve the following deficits and impairments:  Abnormal gait, Decreased range of motion, Increased fascial restricitons, Increased muscle spasms, Pain, Decreased mobility, Decreased strength, Decreased activity tolerance  Visit Diagnosis: Pain in right hip  Muscle weakness (generalized)  Cramp and spasm  Other symptoms and signs involving the musculoskeletal system     Problem List Patient Active Problem List   Diagnosis Date Noted  . Routine general medical examination at a health  care facility 05/27/2018  . Essential (primary) hypertension 05/20/2017  . Syncope 02/09/2017  . DDD (degenerative disc disease), lumbar 01/16/2011  . RESTLESS LEG SYNDROME 06/07/2009  . Hyperglycemia  06/07/2009  . Hyperlipidemia 07/19/2007   Lavinia Sharps, PT 12/15/19 7:49 PM Phone: [phone] Fax: [phone] Vivien Presto 12/15/2019, 7:48 PM  [facility] [address] [place], [place], [address] Phone: [phone]   Fax:  [phone]  Name: LAKAYLA BARRINGTON MRN: [id number] Date of Birth: 05-18-1941

## 2019-12-19 ENCOUNTER — Encounter: Payer: Self-pay | Admitting: Family Medicine

## 2019-12-19 ENCOUNTER — Telehealth: Payer: Self-pay

## 2019-12-19 ENCOUNTER — Telehealth (INDEPENDENT_AMBULATORY_CARE_PROVIDER_SITE_OTHER): Payer: Medicare Other | Admitting: Family Medicine

## 2019-12-19 VITALS — BP 130/70 | Ht 65.5 in

## 2019-12-19 DIAGNOSIS — G2581 Restless legs syndrome: Secondary | ICD-10-CM | POA: Diagnosis not present

## 2019-12-19 DIAGNOSIS — I10 Essential (primary) hypertension: Secondary | ICD-10-CM | POA: Diagnosis not present

## 2019-12-19 MED ORDER — ROPINIROLE HCL 5 MG PO TABS: 5.0000 mg | ORAL_TABLET | Freq: Every day | ORAL | 1 refills | Status: DC

## 2019-12-19 NOTE — Progress Notes (Signed)
Virtual Visit via Video Note   I connected with Andrea Kelley on 12/19/19 by a video enabled telemedicine application and verified that I am speaking with the correct person using two identifiers.  Location patient: home Location provider:work office Persons participating in the virtual visit: patient, provider  I discussed the limitations of evaluation and management by telemedicine and the availability of in person appointments. The patient expressed understanding and agreed to proceed.  HPI: Andrea Kelley is a 79 yo female following on some chronic medical problems. She needs a refill on Ropinirole 5 mg, which she takes for RLS. She has been on medication since 2008.  Problem has improved since she started medication but still has some episodes. She has not noted lower extremity erythema, skin rash, or edema. Exacerbated with prolonged sitting with extended LE's and when in bed. Alleviated by moving LE's. Problem sometiems interferes with sleep.  HTN: Dx'ed around 2017. She is on Losartan 50 mg daily. She checks BP regularly, 120-130's/70's. Negative for severe/frequent headache, visual changes, chest pain, dyspnea, palpitation, claudication, focal weakness, or edema.  Lab Results  Component Value Date   CREATININE 0.76 05/16/2019   BUN 16 05/16/2019   NA 139 05/16/2019   K 4.6 05/16/2019   CL 105 05/16/2019   CO2 26 05/16/2019   ROS: See pertinent positives and negatives per HPI.  Past Medical History:  Diagnosis Date  . DDD (degenerative disc disease), lumbar   . Hyperlipidemia   . Osteopenia    [facility]  . Syncope     Past Surgical History:  Procedure Laterality Date  . ABDOMINAL HYSTERECTOMY     For dysfunctional bleeding; no BSO  . BACK SURGERY  1970   ruptured disc ; Dr Roxan Hockey  . COLONOSCOPY  2001   Negative; done for rectal bleeding  . LOOP RECORDER INSERTION N/A 02/16/2017   Procedure: Loop Recorder Insertion;  Surgeon: Thurmon Fair, MD;  [facility];  Service: Cardiovascular;  Laterality: N/A;    Family History  Problem Relation Age of Onset  . Lung cancer Father        smoker  . Rheum arthritis Father   . Heart attack Mother        31s  . Stroke Mother        in 30s  . Breast cancer Maternal Aunt   . Diabetes Neg Hx     Social History   Socioeconomic History  . Marital status: Widowed    Spouse name: Not on file  . Number of children: Not on file  . Years of education: Not on file  . Highest education level: Not on file  Occupational History  . Occupation: Retired Landscape architect, trained in Facilities manager therapy  Tobacco Use  . Smoking status: Never Smoker  . Smokeless tobacco: Never Used  Substance and Sexual Activity  . Alcohol use: Yes    Comment: Socially , 1-2 glasseswine / night  . Drug use: No  . Sexual activity: Not on file  Other Topics Concern  . Not on file  Social History Narrative   Lives w/ Husband in [place]   Social Determinants of Health   Financial Resource Strain:   . Difficulty of Paying Living Expenses:   Food Insecurity:   . Worried About Programme researcher, broadcasting/film/video in the Last Year:   . Barista in the Last Year:   Transportation Needs:   . Freight forwarder (Medical):   Marland Kitchen Lack of  Transportation (Non-Medical):   Physical Activity:   . Days of Exercise per Week:   . Minutes of Exercise per Session:   Stress:   . Feeling of Stress :   Social Connections:   . Frequency of Communication with Friends and Family:   . Frequency of Social Gatherings with Friends and Family:   . Attends Religious Services:   . Active Member of Clubs or Organizations:   . Attends Archivist Meetings:   Marland Kitchen Marital Status:   Intimate Partner Violence:   . Fear of Current or Ex-Partner:   . Emotionally Abused:   Marland Kitchen Physically Abused:   . Sexually Abused:     Current Outpatient Medications:  .  augmented betamethasone dipropionate (DIPROLENE-AF) 0.05 % cream, APPLY  TOPICALLY TO THE AFFECTED AREA TWICE DAILY, Disp: , Rfl:  .  cephALEXin (KEFLEX) 500 MG capsule, Take 500 mg by mouth 3 (three) times daily., Disp: , Rfl:  .  losartan (COZAAR) 50 MG tablet, Take 1 tablet (50 mg total) by mouth daily., Disp: 90 tablet, Rfl: 2 .  pravastatin (PRAVACHOL) 20 MG tablet, TAKE 1 TABLET(20 MG) BY MOUTH DAILY. Need updated lab work for more refills., Disp: 90 tablet, Rfl: 3 .  ropinirole (REQUIP) 5 MG tablet, Take 1 tablet (5 mg total) by mouth at bedtime. Must keep schedule appt w/new provider for future refills, Disp: 90 tablet, Rfl: 1  EXAM:  VITALS per patient if applicable:BP 007/12   Ht 5' 5.5" (1.664 m)   BMI 24.68 kg/m   GENERAL: alert, oriented, appears well and in no acute distress  HEENT: atraumatic, conjunctiva clear, no obvious abnormalities on inspection.  NECK: normal movements of the head and neck  LUNGS: on inspection no signs of respiratory distress, breathing rate appears normal, no obvious gross SOB, gasping or wheezing  CV: no obvious cyanosis  PSYCH/NEURO: pleasant and cooperative, no obvious depression or anxiety, speech and thought processing grossly intact  ASSESSMENT AND PLAN:  Discussed the following assessment and plan: Orders Placed This Encounter  Procedures  . Basic metabolic panel    Essential (primary) hypertension BP adequately controlled. No changes in current management. Continue low-salt diet.  RESTLESS LEG SYNDROME Not quite well controlled but stable. Still having episodes but in general medication has helped with frequency and intensity. Continue Requip 5 mg daily.    I discussed the assessment and treatment plan with the patient. Andrea Kelley was provided an opportunity to ask questions and all were answered. She agreed with the plan and demonstrated an understanding of the instructions.   Return in about 5 weeks (around 01/24/2020) for Lab appt 01/24/20 and awv/cpe late 05/2020.Marland Kitchen   Aicia Babinski [place], MD

## 2019-12-19 NOTE — Telephone Encounter (Signed)
OK to discharge from device clinic

## 2019-12-19 NOTE — Telephone Encounter (Signed)
Patient made aware that Dr Recardo Evangelist is [facility] with discontinuing monitoring LINQ. Instructed to unplug monitor and that a return kit will be sent by [employer] in 7-10 days to return the home monitor. Address confirmed . Remote transmissions cancelled.

## 2019-12-19 NOTE — Assessment & Plan Note (Signed)
BP adequately controlled. No changes in current management. Continue low salt diet. 

## 2019-12-19 NOTE — Assessment & Plan Note (Signed)
Not quite well controlled but stable. Still having episodes but in general medication has helped with frequency and intensity. Continue Requip 5 mg daily.

## 2019-12-19 NOTE — Telephone Encounter (Signed)
Patient called and wants to know if she can just unplug the monitor at home and just not deal with it anymore; she says she has had the linq for 3 years and she hasnt had any episodes and she just doesn't want to deal with it anymore. Also patient will be out of town for a couple weeks and wont be near her monitor. Please give patient a call back if no answer please leave a message per patient.

## 2019-12-20 ENCOUNTER — Ambulatory Visit: Payer: Medicare Other | Admitting: Physical Therapy

## 2019-12-20 ENCOUNTER — Other Ambulatory Visit: Payer: Self-pay

## 2019-12-20 ENCOUNTER — Encounter: Payer: Self-pay | Admitting: Physical Therapy

## 2019-12-20 DIAGNOSIS — M25551 Pain in right hip: Secondary | ICD-10-CM | POA: Diagnosis not present

## 2019-12-20 DIAGNOSIS — M6281 Muscle weakness (generalized): Secondary | ICD-10-CM | POA: Diagnosis not present

## 2019-12-20 DIAGNOSIS — R29898 Other symptoms and signs involving the musculoskeletal system: Secondary | ICD-10-CM

## 2019-12-20 DIAGNOSIS — R252 Cramp and spasm: Secondary | ICD-10-CM

## 2019-12-20 NOTE — Patient Instructions (Signed)
Access Code: [email or web address] URL: [email or web address] Date: 12/13/2019 Prepared by: Lavinia Sharps  Exercises Supine Hamstring Stretch with Strap - 2 x daily - 7 x weekly - 3 reps - 1 sets - 30 sec hold Seated Hamstring Stretch - 2 x daily - 7 x weekly - 3 reps - 1 sets - 30 sec hold Standing Glute Med Mobilization with Small Ball on Wall - 2 x daily - 7 x weekly - 1 reps - 1 sets - 2-3 min hold Hooklying Single Leg Bent Knee Fallouts with Resistance - 2 x daily - 7 x weekly - 10 reps - 1 sets Supine Figure 4 Piriformis Stretch - 1 x daily - 7 x weekly - 3 reps - 1 sets - 30 sec hold Clam - 1 x daily - 7 x weekly - 10 reps - 3 sets Seated Sidebending Arms Overhead - 1 x daily - 7 x weekly - 1 sets - 5 reps - 10 sec hold Beginner Bridge - 1 x daily - 7 x weekly - 10 reps - 3 sets Sit to Stand - 1 x daily - 7 x weekly - 1 sets - 10 reps Single Leg Stance with Support - 1 x daily - 7 x weekly - 1 sets - 10 reps Standing Hip Abduction Kicks - 1 x daily - 7 x weekly - 1 sets - 10 reps Standing Hip Extension with Resistance at Ankles and Unilateral Counter Support - 1 x daily - 7 x weekly - 1 sets - 10 reps  Patient Education Trigger Point Dry Needling

## 2019-12-20 NOTE — Therapy (Signed)
[facility] [address], [address] [place], [place], [address] Phone: [phone]   Fax:  [phone]  Physical Therapy Treatment  Patient Details  Name: SACHE SANE MRN: [id number] Date of Birth: 04-Aug-1941 Referring Provider (PT): Dr. Lavada Mesi   Encounter Date: 12/20/2019  PT End of Session - 12/20/19 1920    Visit Number  8    Date for PT Re-Evaluation  01/12/20    Authorization Type  BCBS Medicare    PT Start Time  0850    PT Stop Time  0929    PT Time Calculation (min)  39 min    Activity Tolerance  Patient tolerated treatment well       Past Medical History:  Diagnosis Date  . DDD (degenerative disc disease), lumbar   . Hyperlipidemia   . Osteopenia    [place] [facility]  . Syncope     Past Surgical History:  Procedure Laterality Date  . ABDOMINAL HYSTERECTOMY     For dysfunctional bleeding; no BSO  . BACK SURGERY  1970   ruptured disc ; Dr Roxan Hockey  . COLONOSCOPY  2001   Negative; done for rectal bleeding  . LOOP RECORDER INSERTION N/A 02/16/2017   Procedure: Loop Recorder Insertion;  Surgeon: Thurmon Fair, MD;  [facility];  Service: Cardiovascular;  Laterality: N/A;    There were no vitals filed for this visit.  Subjective Assessment - 12/20/19 0853    Subjective  I was really sore for a day after last time.    Currently in Pain?  No/denies    Pain Score  0-No pain                       [facility] Adult PT Treatment/Exercise - 12/20/19 0001      Therapeutic Activites    Therapeutic Activities  ADL's;Lifting;Other Therapeutic Activities    Other Therapeutic Activities  review technique and instructed in stooping (to clean up after her dog) without adducting hips/knees or rotating      Lumbar Exercises: Stretches   Active Hamstring Stretch  Right;Left;5 reps    Active Hamstring Stretch Limitations  on 2nd step    Other Lumbar Stretch Exercise  hip flexor stretch  on 2nd step 10x right/left       Knee/Hip Exercises: Standing   Hip Abduction  Stengthening;Right;Left;1 set;10 reps    Abduction Limitations  red band    Hip Extension  Stengthening;Right;Left;10 reps    Extension Limitations  red band    Forward Step Up  Right;Left;1 set;10 reps;Hand Hold: 2    Other Standing Knee Exercises  hold on stand tall ex secondary to 6/10 pain       Manual Therapy   Manual therapy comments  instrument assisted soft tissue Addaday gluteals, piriformis     Joint Mobilization  right long leg distraction grade 3 5x; hip inferior mob; AP in internal rotation grade 3 5x     Soft tissue mobilization  gluteals, piriformis     Muscle Energy Technique  contract relax piriformis 3x 5 sec              PT Education - 12/20/19 1919    Education Details  red band hip extension and hip abduction    Person(s) Educated  Patient    Methods  Explanation;Demonstration;Handout    Comprehension  Returned demonstration;Verbalized understanding       PT Short Term Goals - 12/13/19 1510  PT SHORT TERM GOAL #1   Title  independent with initial HEP    Status  Achieved      PT SHORT TERM GOAL #2   Title  right hip pain decreased >/= 25% with walking her dog and sitting    Status  Achieved      PT SHORT TERM GOAL #3   Title  patient right ilium is in correct alignment for 4 weeks    Status  Achieved        PT Long Term Goals - 11/17/19 1709      PT LONG TERM GOAL #1   Title  independent with HEP    Time  8    Period  Weeks    Status  New    Target Date  01/12/20      PT LONG TERM GOAL #2   Title  report ability to walk dog for at least 3/4 mile without increase in pain for improved function    Time  8    Period  Weeks    Status  New    Target Date  01/12/20      PT LONG TERM GOAL #3   Title  report ability to stand > 15 min without increase in pain for improved function    Time  8    Period  Weeks    Status  New    Target Date  01/12/20       PT LONG TERM GOAL #4   Title  demonstrate at least 4/5 bil hip abduction strength for improved function and decreased risk of reinjury    Time  8    Period  Weeks    Status  New    Target Date  01/12/20      PT LONG TERM GOAL #5   Title  FOTO score </= 24% limitation    Time  8    Period  Weeks    Status  New    Target Date  01/12/20            Plan - 12/20/19 9798    Clinical Impression Statement  The patient has quick muscular fatigue on right compared to left with gluteal strengthening ex's.  She reports good pain relief with instrument assisted soft tissue work and hip joint mobilizations.  She requests DN next visit which she feels will give her additional relief for her upcoming trip to [place].  Therapist monitoring response to all interventions and modifying treatment as needed.    Comorbidities  Osteopenia; Back surgery 1970, loop reader insertion of the left chest    Examination-Participation Restrictions  Laundry;Yard Work;Community Activity;Shop    Rehab Potential  Excellent    PT Frequency  2x / week    PT Duration  8 weeks    PT Treatment/Interventions  Cryotherapy;Iontophoresis 4mg /ml Dexamethasone;Moist Heat;Neuromuscular re-education;Therapeutic exercise;Therapeutic activities;Patient/family education;Manual techniques;Dry needling;Taping;Joint Manipulations    PT Next Visit Plan  DN to prepare for upcoming trip to ;  right hip manual therapy;  glute strengthening    PT Home Exercise Plan  Access Code: [place]       Patient will benefit from skilled therapeutic intervention in order to improve the following deficits and impairments:  Abnormal gait, Decreased range of motion, Increased fascial restricitons, Increased muscle spasms, Pain, Decreased mobility, Decreased strength, Decreased activity tolerance  Visit Diagnosis: Pain in right hip  Muscle weakness (generalized)  Cramp and spasm  Other symptoms and signs involving the musculoskeletal  system  Problem List Patient Active Problem List   Diagnosis Date Noted  . Routine general medical examination at a health care facility 05/27/2018  . Essential (primary) hypertension 05/20/2017  . Syncope 02/09/2017  . DDD (degenerative disc disease), lumbar 01/16/2011  . RESTLESS LEG SYNDROME 06/07/2009  . Hyperglycemia 06/07/2009  . Hyperlipidemia 07/19/2007   Ruben Im, PT 12/20/19 7:27 PM Phone: [phone] Fax: [phone] Alvera Singh 12/20/2019, 7:27 PM  [facility] [address] [place], [place], [address] Phone: [phone]   Fax:  [phone]  Name: ELLOISE ROARK MRN: [id number] Date of Birth: 1941-07-12

## 2019-12-22 ENCOUNTER — Ambulatory Visit: Payer: Medicare Other | Admitting: Physical Therapy

## 2019-12-22 ENCOUNTER — Encounter: Payer: Self-pay | Admitting: Physical Therapy

## 2019-12-22 ENCOUNTER — Other Ambulatory Visit: Payer: Self-pay

## 2019-12-22 DIAGNOSIS — R29898 Other symptoms and signs involving the musculoskeletal system: Secondary | ICD-10-CM | POA: Diagnosis not present

## 2019-12-22 DIAGNOSIS — M6281 Muscle weakness (generalized): Secondary | ICD-10-CM

## 2019-12-22 DIAGNOSIS — M25551 Pain in right hip: Secondary | ICD-10-CM | POA: Diagnosis not present

## 2019-12-22 DIAGNOSIS — R252 Cramp and spasm: Secondary | ICD-10-CM | POA: Diagnosis not present

## 2019-12-22 NOTE — Therapy (Signed)
[facility] [address] [place], [place], [address] Phone: [phone]   Fax:  [phone]  Physical Therapy Treatment  Patient Details  Name: Andrea Kelley MRN: [id number] Date of Birth: 25-Apr-1941 Referring Provider (PT): Dr. Lavada Mesi   Encounter Date: 12/22/2019  PT End of Session - 12/22/19 0929    Visit Number  9    Date for PT Re-Evaluation  01/12/20    Authorization Type  BCBS Medicare    PT Start Time  0845    PT Stop Time  0932    PT Time Calculation (min)  47 min    Activity Tolerance  Patient tolerated treatment well       Past Medical History:  Diagnosis Date  . DDD (degenerative disc disease), lumbar   . Hyperlipidemia   . Osteopenia    Andrea Kelley  . Syncope     Past Surgical History:  Procedure Laterality Date  . ABDOMINAL HYSTERECTOMY     For dysfunctional bleeding; no BSO  . BACK SURGERY  1970   ruptured disc ; Dr Roxan Hockey  . COLONOSCOPY  2001   Negative; done for rectal bleeding  . LOOP RECORDER INSERTION N/A 02/16/2017   Procedure: Loop Recorder Insertion;  Surgeon: Thurmon Fair, MD;  [facility];  Service: Cardiovascular;  Laterality: N/A;    There were no vitals filed for this visit.  Subjective Assessment - 12/22/19 0846    Subjective  I'm feeling good today.  That manual therapy really helped last time for 2 days!  I can get in the car without pain now.    Currently in Pain?  No/denies    Pain Score  0-No pain    Pain Location  Hip    Pain Orientation  Right                        [facility] Adult PT Treatment/Exercise - 12/22/19 0001      Self-Care   Self-Care  ADL's    ADL's  Discussion on self care on long drive to [place] tomorrow and appropriate stretches, walking on a frequent basis      Moist Heat Therapy   Number Minutes Moist Heat  5 Minutes    Moist Heat Location  Hip      Manual Therapy   Manual therapy comments   instrument assisted soft tissue Addaday gluteals, piriformis     Joint Mobilization  right long leg distraction grade 3 5x; hip inferior mob; AP in internal rotation grade 3 5x     Soft tissue mobilization  gluteals, piriformis     Muscle Energy Technique  contract relax piriformis 3x 5 sec        Trigger Point Dry Needling - 12/22/19 0001    Consent Given?  Yes    Gluteus Minimus Response  Palpable increased muscle length    Gluteus Medius Response  Palpable increased muscle length    Gluteus Maximus Response  Palpable increased muscle length;Twitch response elicited    Piriformis Response  Twitch response elicited;Palpable increased muscle length      Right only       PT Short Term Goals - 12/13/19 1510      PT SHORT TERM GOAL #1   Title  independent with initial HEP    Status  Achieved      PT SHORT TERM GOAL #2   Title  right hip pain decreased >/= 25%  with walking her dog and sitting    Status  Achieved      PT SHORT TERM GOAL #3   Title  patient right ilium is in correct alignment for 4 weeks    Status  Achieved        PT Long Term Goals - 11/17/19 1709      PT LONG TERM GOAL #1   Title  independent with HEP    Time  8    Period  Weeks    Status  New    Target Date  01/12/20      PT LONG TERM GOAL #2   Title  report ability to walk dog for at least 3/4 mile without increase in pain for improved function    Time  8    Period  Weeks    Status  New    Target Date  01/12/20      PT LONG TERM GOAL #3   Title  report ability to stand > 15 min without increase in pain for improved function    Time  8    Period  Weeks    Status  New    Target Date  01/12/20      PT LONG TERM GOAL #4   Title  demonstrate at least 4/5 bil hip abduction strength for improved function and decreased risk of reinjury    Time  8    Period  Weeks    Status  New    Target Date  01/12/20      PT LONG TERM GOAL #5   Title  FOTO score </= 24% limitation    Time  8    Period   Weeks    Status  New    Target Date  01/12/20            Plan - 12/22/19 1243    Clinical Impression Statement  The patient notes continued functional improvements especially with getting in/out of the car.   She has signficantly fewer tender points in hip and thigh musculature compared to previous visit.  Improved soft tissue lengthening and joint mobility as well.  We discussed a plan to handle a very long car ride tomorrow and she has a good understanding of her current HEP.  Therapist monitoring response with all interventions.    Comorbidities  Osteopenia; Back surgery 1970, loop reader insertion of the left chest    Examination-Activity Limitations  Bed Mobility;Locomotion Level;Carry;Stand    Rehab Potential  Excellent    PT Frequency  2x / week    PT Duration  8 weeks    PT Treatment/Interventions  Cryotherapy;Iontophoresis 4mg /ml Dexamethasone;Moist Heat;Neuromuscular re-education;Therapeutic exercise;Therapeutic activities;Patient/family education;Manual techniques;Dry needling;Taping;Joint Manipulations    PT Next Visit Plan  pt to return in 2 weeks after her trip;  10th visit progress note;  FOTO;  MMT and ROM;  progress toward goals;  right hip manual therapy;  gluteal strengthening progression    PT Home Exercise Plan  Access Code:       Patient will benefit from skilled therapeutic intervention in order to improve the following deficits and impairments:  Abnormal gait, Decreased range of motion, Increased fascial restricitons, Increased muscle spasms, Pain, Decreased mobility, Decreased strength, Decreased activity tolerance  Visit Diagnosis: Pain in right hip  Muscle weakness (generalized)  Cramp and spasm  Other symptoms and signs involving the musculoskeletal system     Problem List Patient Active Problem List   Diagnosis Date Noted  .  Routine general medical examination at a health care facility 05/27/2018  . Essential (primary) hypertension  05/20/2017  . Syncope 02/09/2017  . DDD (degenerative disc disease), lumbar 01/16/2011  . RESTLESS LEG SYNDROME 06/07/2009  . Hyperglycemia 06/07/2009  . Hyperlipidemia 07/19/2007   Andrea Kelley, PT 12/22/19 12:51 PM Phone: [phone] Fax: [phone] Andrea Kelley 12/22/2019, 12:50 PM  [facility] [address] [place], [place], [address] Phone: [phone]   Fax:  [phone]  Name: TAKEYAH WIEMAN MRN: [id number] Date of Birth: 08-03-1941

## 2019-12-27 ENCOUNTER — Encounter: Payer: Medicare Other | Admitting: Physical Therapy

## 2019-12-29 ENCOUNTER — Encounter: Payer: Medicare Other | Admitting: Physical Therapy

## 2020-01-03 ENCOUNTER — Encounter: Payer: Medicare Other | Admitting: Physical Therapy

## 2020-01-05 ENCOUNTER — Encounter: Payer: Medicare Other | Admitting: Physical Therapy

## 2020-01-10 ENCOUNTER — Other Ambulatory Visit: Payer: Self-pay

## 2020-01-10 ENCOUNTER — Ambulatory Visit: Payer: Medicare Other | Attending: Family Medicine | Admitting: Physical Therapy

## 2020-01-10 DIAGNOSIS — R29898 Other symptoms and signs involving the musculoskeletal system: Secondary | ICD-10-CM | POA: Insufficient documentation

## 2020-01-10 DIAGNOSIS — M25551 Pain in right hip: Secondary | ICD-10-CM | POA: Diagnosis not present

## 2020-01-10 DIAGNOSIS — M6281 Muscle weakness (generalized): Secondary | ICD-10-CM | POA: Insufficient documentation

## 2020-01-10 DIAGNOSIS — R252 Cramp and spasm: Secondary | ICD-10-CM | POA: Insufficient documentation

## 2020-01-10 NOTE — Patient Instructions (Signed)
Access Code: [email or web address] URL: [email or web address] Date: 12/13/2019 Prepared by: Lavinia Sharps  Exercises Supine Hamstring Stretch with Strap - 2 x daily - 7 x weekly - 3 reps - 1 sets - 30 sec hold Seated Hamstring Stretch - 2 x daily - 7 x weekly - 3 reps - 1 sets - 30 sec hold Standing Glute Med Mobilization with Small Ball on Wall - 2 x daily - 7 x weekly - 1 reps - 1 sets - 2-3 min hold Hooklying Single Leg Bent Knee Fallouts with Resistance - 2 x daily - 7 x weekly - 10 reps - 1 sets Supine Figure 4 Piriformis Stretch - 1 x daily - 7 x weekly - 3 reps - 1 sets - 30 sec hold Clam - 1 x daily - 7 x weekly - 10 reps - 3 sets Seated Sidebending Arms Overhead - 1 x daily - 7 x weekly - 1 sets - 5 reps - 10 sec hold Beginner Bridge - 1 x daily - 7 x weekly - 10 reps - 3 sets Sit to Stand - 1 x daily - 7 x weekly - 1 sets - 10 reps Single Leg Stance with Support - 1 x daily - 7 x weekly - 1 sets - 10 reps Standing Hip Abduction Kicks - 1 x daily - 7 x weekly - 1 sets - 10 reps Standing Hip Extension with Resistance at Ankles and Unilateral Counter Support - 1 x daily - 7 x weekly - 1 sets - 10 reps Supine Transversus Abdominis Bracing - Hands on Stomach - 1 x daily - 7 x weekly - 1 sets - 10 reps Supine Transversus Abdominis Bracing - Hands on Thighs - 1 x daily - 7 x weekly - 1 sets - 10 reps Sidelying Transversus Abdominis Bracing - 1 x daily - 7 x weekly - 1 sets - 10 reps Seated Transversus Abdominis Bracing - 1 x daily - 7 x weekly - 1 sets - 10 reps Standing Transverse Abdominis Contraction - 1 x daily - 7 x weekly - 1 sets - 10 reps  Patient Education Trigger Point Dry Needling

## 2020-01-10 NOTE — Therapy (Signed)
[facility] [address], [place], [place], [address] Phone: [phone]   Fax:  [phone]  Physical Therapy Treatment  Patient Details  Name: Andrea Kelley MRN: [id number] Date of Birth: 08/04/1941 Referring Provider (PT): Dr. Eunice Blase  Progress Note Reporting Period 11/17/19 to 01/10/20  See note below for Objective Data and Assessment of Progress/Goals.      Encounter Date: 01/10/2020  PT End of Session - 01/10/20 1130    Visit Number  10    Date for PT Re-Evaluation  01/12/20    Authorization Type  BCBS Medicare    PT Start Time  0848    PT Stop Time  0930    PT Time Calculation (min)  42 min    Activity Tolerance  Patient tolerated treatment well       Past Medical History:  Diagnosis Date  . DDD (degenerative disc disease), lumbar   . Hyperlipidemia   . Osteopenia    [place] [facility]  . Syncope     Past Surgical History:  Procedure Laterality Date  . ABDOMINAL HYSTERECTOMY     For dysfunctional bleeding; no BSO  . BACK SURGERY  1970   ruptured disc ; Dr Quentin Cornwall  . COLONOSCOPY  2001   Negative; done for rectal bleeding  . LOOP RECORDER INSERTION N/A 02/16/2017   Procedure: Loop Recorder Insertion;  Surgeon: Sanda Klein, MD;  Location: [facility];  Service: Cardiovascular;  Laterality: N/A;    There were no vitals filed for this visit.  Subjective Assessment - 01/10/20 0850    Subjective  Some days my hip was wonderful.  Some days weren't as good.  Hurt yesterday when trying to move flower pots.  I was able to lie on my stomach some at the beach.  Riding in the car to [place] was fine.    How long can you walk comfortably?  variable some days a long time, sometimes not so long    Currently in Pain?  No/denies    Pain Score  0-No pain    Pain Location  Hip    Pain Orientation  Right    Pain Type  Chronic pain    Aggravating Factors   lying    Pain Relieving Factors  sitting;          [facility] PT Assessment - 01/10/20 0001      AROM   Overall AROM Comments  lumbar ROM decreased by 10%      Strength   Right Hip Extension  3/5    Right Hip ABduction  4-/5                    [facility] Adult PT Treatment/Exercise - 01/10/20 0001      Self-Care   ADL's  discussion of abdominal brace with turning in bed       Lumbar Exercises: Supine   Ab Set  10 reps    Clam  10 reps    Clam Limitations  isometrically with blue band around thighs with abdominal brace    Isometric Hip Flexion  5 reps    Other Supine Lumbar Exercises  abdominal brace with ball squeeze 10x       Knee/Hip Exercises: Standing   Other Standing Knee Exercises  abdominal brace against wall 5x       Knee/Hip Exercises: Seated   Other Seated Knee/Hip Exercises  seated abdominal brace back of the chair and pressing on thighs  10x       Manual Therapy   Manual therapy comments  instrument assisted soft tissue Addaday gluteals, piriformis, HS, quads    Joint Mobilization  right long leg distraction grade 3 5x; hip inferior mob; AP in internal rotation grade 3 5x     Soft tissue mobilization  gluteals, piriformis     Muscle Energy Technique  contract relax piriformis 3x 5 sec              PT Education - 01/10/20 1130    Education Details  [id number]  abdominal brace supine, sidelying, seated, standing    Person(s) Educated  Patient    Methods  Explanation;Demonstration;Handout    Comprehension  Returned demonstration;Verbalized understanding       PT Short Term Goals - 12/13/19 1510      PT SHORT TERM GOAL #1   Title  independent with initial HEP    Status  Achieved      PT SHORT TERM GOAL #2   Title  right hip pain decreased >/= 25% with walking her dog and sitting    Status  Achieved      PT SHORT TERM GOAL #3   Title  patient right ilium is in correct alignment for 4 weeks    Status  Achieved        PT Long Term Goals - 01/10/20 1132      PT LONG TERM GOAL #1    Title  independent with HEP    Time  8    Period  Weeks    Status  On-going      PT LONG TERM GOAL #2   Title  report ability to walk dog for at least 3/4 mile without increase in pain for improved function    Time  8    Period  Weeks    Status  On-going      PT LONG TERM GOAL #3   Title  report ability to stand > 15 min without increase in pain for improved function    Time  8    Period  Weeks    Status  On-going      PT LONG TERM GOAL #4   Title  demonstrate at least 4/5 bil hip abduction strength for improved function and decreased risk of reinjury    Time  8    Period  Weeks    Status  On-going      PT LONG TERM GOAL #5   Title  FOTO score </= 24% limitation    Time  8    Period  Weeks    Status  On-going            Plan - 01/10/20 0925    Clinical Impression Statement  The patient returns after a 2 week vacation.  She reports a variable status with pain and function.  She plans on doing some walking this walking to determine walking tolerance.  Her lumbar/hip ROM has improved from 25% limitation to 10%.  Her hip extension and hip abduction strength has improved significantly since initial assessment but still deficient for strength needed for longer distance ambulation and standing for longer periods of time.  Tender points in piriformis improved with manual therapy and she has had a positive response to DN during course of PT.  She has pain with rolling in bed.  Instructed pt in isometric lumbo/pelvic/hip stabilization today to decrease pain with bed mobility.  Will further reassess progress toward goals next  visit.    Comorbidities  Osteopenia; Back surgery 1970, loop reader insertion of the left chest    Stability/Clinical Decision Making  Evolving/Moderate complexity    Rehab Potential  Excellent    PT Frequency  2x / week    PT Duration  8 weeks    PT Treatment/Interventions  Cryotherapy;Iontophoresis 4mg /ml Dexamethasone;Moist Heat;Neuromuscular  re-education;Therapeutic exercise;Therapeutic activities;Patient/family education;Manual techniques;Dry needling;Taping;Joint Manipulations    PT Next Visit Plan  ERO;  FOTO;  MMT and ROM;  progress toward goals;  right hip manual therapy;  gluteal strengthening progression;  DN next visit    PT Home Exercise Plan  Access Code:       Patient will benefit from skilled therapeutic intervention in order to improve the following deficits and impairments:  Abnormal gait, Decreased range of motion, Increased fascial restricitons, Increased muscle spasms, Pain, Decreased mobility, Decreased strength, Decreased activity tolerance  Visit Diagnosis: Pain in right hip  Muscle weakness (generalized)  Cramp and spasm  Other symptoms and signs involving the musculoskeletal system     Problem List Patient Active Problem List   Diagnosis Date Noted  . Routine general medical examination at a health care facility 05/27/2018  . Essential (primary) hypertension 05/20/2017  . Syncope 02/09/2017  . DDD (degenerative disc disease), lumbar 01/16/2011  . RESTLESS LEG SYNDROME 06/07/2009  . Hyperglycemia 06/07/2009  . Hyperlipidemia 07/19/2007   14/03/2007, PT 01/10/20 11:43 AM Phone: [phone] Fax: [phone] 01/10/2020, 11:43 AM  [facility] [address] [place], [place], [place] Phone: [phone]   Fax:  [phone]  Name: Andrea Kelley MRN: Doralee Albino Date of Birth: Mar 12, 1941

## 2020-01-12 ENCOUNTER — Ambulatory Visit: Payer: Medicare Other | Admitting: Physical Therapy

## 2020-01-12 ENCOUNTER — Other Ambulatory Visit: Payer: Self-pay

## 2020-01-12 DIAGNOSIS — M25551 Pain in right hip: Secondary | ICD-10-CM

## 2020-01-12 DIAGNOSIS — R252 Cramp and spasm: Secondary | ICD-10-CM | POA: Diagnosis not present

## 2020-01-12 DIAGNOSIS — M6281 Muscle weakness (generalized): Secondary | ICD-10-CM

## 2020-01-12 DIAGNOSIS — R29898 Other symptoms and signs involving the musculoskeletal system: Secondary | ICD-10-CM

## 2020-01-12 NOTE — Patient Instructions (Signed)
Access Code: [email or web address] URL: [email or web address] Date: 12/13/2019 Prepared by: Lavinia Sharps  Exercises Supine Hamstring Stretch with Strap - 2 x daily - 7 x weekly - 3 reps - 1 sets - 30 sec hold Seated Hamstring Stretch - 2 x daily - 7 x weekly - 3 reps - 1 sets - 30 sec hold Standing Glute Med Mobilization with Small Ball on Wall - 2 x daily - 7 x weekly - 1 reps - 1 sets - 2-3 min hold Hooklying Single Leg Bent Knee Fallouts with Resistance - 2 x daily - 7 x weekly - 10 reps - 1 sets Supine Figure 4 Piriformis Stretch - 1 x daily - 7 x weekly - 3 reps - 1 sets - 30 sec hold Clam - 1 x daily - 7 x weekly - 10 reps - 3 sets Seated Sidebending Arms Overhead - 1 x daily - 7 x weekly - 1 sets - 5 reps - 10 sec hold Beginner Bridge - 1 x daily - 7 x weekly - 10 reps - 3 sets Sit to Stand - 1 x daily - 7 x weekly - 1 sets - 10 reps Single Leg Stance with Support - 1 x daily - 7 x weekly - 1 sets - 10 reps Standing Hip Abduction Kicks - 1 x daily - 7 x weekly - 1 sets - 10 reps Standing Hip Extension with Resistance at Ankles and Unilateral Counter Support - 1 x daily - 7 x weekly - 1 sets - 10 reps Supine Transversus Abdominis Bracing - Hands on Stomach - 1 x daily - 7 x weekly - 1 sets - 10 reps Supine Transversus Abdominis Bracing - Hands on Thighs - 1 x daily - 7 x weekly - 1 sets - 10 reps Sidelying Transversus Abdominis Bracing - 1 x daily - 7 x weekly - 1 sets - 10 reps Seated Transversus Abdominis Bracing - 1 x daily - 7 x weekly - 1 sets - 10 reps Standing Transverse Abdominis Contraction - 1 x daily - 7 x weekly - 1 sets - 10 reps Hip Flexor Stretch at Edge of Bed - 1 x daily - 7 x weekly - 1 sets - 3 reps - 30 hold Sidelying Hip Abduction - 1 x daily - 7 x weekly - 1 sets - 10 reps  Patient Education Trigger Point Dry Needling

## 2020-01-12 NOTE — Therapy (Addendum)
[facility] [address], [place], [place], [address] Phone: [phone]   Fax:  [phone]  Physical Therapy Treatment/Recertification/Discharge Summary  Patient Details  Name: Andrea Kelley MRN: [id number] Date of Birth: 10-Jul-1941 Referring Provider (PT): Dr. Eunice Blase   Encounter Date: 01/12/2020  PT End of Session - 01/12/20 0937    Visit Number  11    Date for PT Re-Evaluation  03/08/20    Authorization Type  [employer] Medicare    PT Start Time  0846    PT Stop Time  0936    PT Time Calculation (min)  50 min    Activity Tolerance  Patient tolerated treatment well       Past Medical History:  Diagnosis Date  . DDD (degenerative disc disease), lumbar   . Hyperlipidemia   . Osteopenia    [place] [facility]  . Syncope     Past Surgical History:  Procedure Laterality Date  . ABDOMINAL HYSTERECTOMY     For dysfunctional bleeding; no BSO  . BACK SURGERY  1970   ruptured disc ; Dr Quentin Cornwall  . COLONOSCOPY  2001   Negative; done for rectal bleeding  . LOOP RECORDER INSERTION N/A 02/16/2017   Procedure: Loop Recorder Insertion;  Surgeon: Sanda Klein, MD;  Location: [facility];  Service: Cardiovascular;  Laterality: N/A;    There were no vitals filed for this visit.  Subjective Assessment - 01/12/20 0846    Subjective  I walked 1/2 mile without pain.  Sometimes no pain.  The bed is the tough part.  It's the lying down thing.  I'm doing a lot of the abdominal bracing.  I kind of ran down the aisle at the grocery store when I forgot something without pain.    Currently in Pain?  Yes    Pain Score  2     Pain Orientation  Right         [facility] PT Assessment - 01/12/20 0001      Observation/Other Assessments   Focus on Therapeutic Outcomes (FOTO)   37%       Strength   Overall Strength Comments  left hip abduction 4/5     Right Hip Extension  4-/5    Right Hip ABduction  4-/5      Flexibility    Soft Tissue Assessment /Muscle Length  yes    Quadriceps  dec right hip flexor length                     [facility] Adult PT Treatment/Exercise - 01/12/20 0001      Lumbar Exercises: Stretches   Other Lumbar Stretch Exercise  hip flexor stretch off the side of the bed 3x 30 sec; left KTC       Lumbar Exercises: Sidelying   Clam  Right;5 reps    Hip Abduction  Right;5 reps      Moist Heat Therapy   Number Minutes Moist Heat  5 Minutes    Moist Heat Location  Hip      Manual Therapy   Soft tissue mobilization  gluteals, piriformis       Review of HEP  Trigger Point Dry Needling - 01/12/20 0001    Consent Given?  Yes    Gluteus Minimus Response  Palpable increased muscle length    Gluteus Medius Response  Palpable increased muscle length    Gluteus Maximus Response  Palpable increased muscle length;Twitch response elicited  Piriformis Response  Palpable increased muscle length           PT Education - 01/12/20 0910    Education Details  supine hip flexor stretch;  sidelying hip abduction    Person(s) Educated  Patient    Methods  Explanation;Demonstration;Handout    Comprehension  Returned demonstration;Verbalized understanding       PT Short Term Goals - 01/12/20 0945      PT SHORT TERM GOAL #1   Title  independent with initial HEP    Status  Achieved      PT SHORT TERM GOAL #2   Title  right hip pain decreased >/= 25% with walking her dog and sitting    Status  Achieved      PT SHORT TERM GOAL #3   Title  patient right ilium is in correct alignment for 4 weeks    Status  Achieved        PT Long Term Goals - 01/12/20 0946      PT LONG TERM GOAL #1   Title  independent with HEP    Period  Weeks    Status  On-going    Target Date  03/08/20      PT LONG TERM GOAL #2   Title  report ability to walk dog for at least 3/4 mile without increase in pain for improved function    Time  8    Period  Weeks    Status  On-going      PT LONG TERM  GOAL #3   Title  report ability to stand > 15 min without increase in pain for improved function    Status  Achieved      PT LONG TERM GOAL #4   Title  demonstrate at least 4/5 bil hip abduction strength for improved function and decreased risk of reinjury    Time  8    Period  Weeks    Status  On-going      PT LONG TERM GOAL #5   Title  FOTO score </= 24% limitation    Time  8    Period  Weeks    Status  On-going            Plan - 01/12/20 0902    Clinical Impression Statement  The patient reports she has variable pain mostly at night time now with turning in bed.  She has been able to walk 1/2 mile without pain and even walked fast in the grocery store this morning without an issue.  Her FOTO functional outcome score has improved from 47% limitation to 37% limitation.  Good overall lumbar and hip ROM but with limitation with right hip flexor lengthening.  Decreased right hip abduction strength 4-/5 and left hip abduction 4/5.  She has responded well to DN to piriformis and gluteal muscles, hip mobilizations and therapeutic exercise.  She would benefit from 1-3 additional visits for further progression of HEP and manual as needed before discharge to independent HEP and self management.    Comorbidities  Osteopenia; Back surgery 1970, loop reader insertion of the left chest    Examination-Participation Restrictions  Laundry;Yard Work;Community Activity;Shop    Stability/Clinical Decision Making  Evolving/Moderate complexity    Rehab Potential  Excellent    PT Frequency  Biweekly    PT Duration  8 weeks    PT Treatment/Interventions  Cryotherapy;Iontophoresis 44m/ml Dexamethasone;Moist Heat;Neuromuscular re-education;Therapeutic exercise;Therapeutic activities;Patient/family education;Manual techniques;Dry needling;Taping;Joint Manipulations    PT Next Visit Plan  follow up in 4 weeks after travels to [place] and [place];  recheck right hip flexor lengths and hip abduction MMT;   check walking tolerance    PT Home Exercise Plan  Access Code: [email or web address]       Patient will benefit from skilled therapeutic intervention in order to improve the following deficits and impairments:  Abnormal gait, Decreased range of motion, Increased fascial restricitons, Increased muscle spasms, Pain, Decreased mobility, Decreased strength, Decreased activity tolerance  Visit Diagnosis: Pain in right hip - Plan: PT plan of care cert/re-cert  Muscle weakness (generalized) - Plan: PT plan of care cert/re-cert  Cramp and spasm - Plan: PT plan of care cert/re-cert  Other symptoms and signs involving the musculoskeletal system - Plan: PT plan of care cert/re-cert  PHYSICAL THERAPY DISCHARGE SUMMARY  Visits from Start of Care: 11  Current functional level related to goals / functional outcomes: The patient called to report she "is doing great and feeling tons better".  She requests discharge from PT.  Remaining deficits: As above   Education / Equipment: HEP Plan: Patient agrees to discharge.  Patient goals were partially met. Patient is being discharged due to being pleased with the current functional level.  ?????       Problem List Patient Active Problem List   Diagnosis Date Noted  . Routine general medical examination at a health care facility 05/27/2018  . Essential (primary) hypertension 05/20/2017  . Syncope 02/09/2017  . DDD (degenerative disc disease), lumbar 01/16/2011  . RESTLESS LEG SYNDROME 06/07/2009  . Hyperglycemia 06/07/2009  . Hyperlipidemia 07/19/2007   Ruben Im, PT 01/12/20 9:49 AM Phone: [phone] Fax: [phone] Alvera Singh 01/12/2020, 9:48 AM  [facility] [address], [place], [place], [address] Phone: [phone]   Fax:  [phone]  Name: Andrea Kelley MRN: [id number] Date of Birth: May 31, 1941

## 2020-01-24 ENCOUNTER — Other Ambulatory Visit: Payer: Medicare Other

## 2020-01-24 ENCOUNTER — Other Ambulatory Visit (INDEPENDENT_AMBULATORY_CARE_PROVIDER_SITE_OTHER): Payer: Medicare Other

## 2020-01-24 DIAGNOSIS — I10 Essential (primary) hypertension: Secondary | ICD-10-CM | POA: Diagnosis not present

## 2020-01-24 LAB — BASIC METABOLIC PANEL
BUN: 17 mg/dL (ref 6–23)
CO2: 25 mEq/L (ref 19–32)
Calcium: 9.1 mg/dL (ref 8.4–10.5)
Chloride: 108 mEq/L (ref 96–112)
Creatinine, Ser: 0.63 mg/dL (ref 0.40–1.20)
GFR: 90.94 mL/min (ref >60.00)
Glucose, Bld: 103 mg/dL — ABNORMAL HIGH (ref 70–99)
Potassium: 4.1 mEq/L (ref 3.5–5.1)
Sodium: 139 mEq/L (ref 135–145)

## 2020-02-09 ENCOUNTER — Ambulatory Visit: Payer: Medicare Other | Admitting: Physical Therapy

## 2020-02-19 ENCOUNTER — Other Ambulatory Visit: Payer: Self-pay | Admitting: Family Medicine

## 2020-02-19 DIAGNOSIS — I10 Essential (primary) hypertension: Secondary | ICD-10-CM

## 2020-03-21 DIAGNOSIS — H353132 Nonexudative age-related macular degeneration, bilateral, intermediate dry stage: Secondary | ICD-10-CM | POA: Diagnosis not present

## 2020-03-21 DIAGNOSIS — Z961 Presence of intraocular lens: Secondary | ICD-10-CM | POA: Diagnosis not present

## 2020-03-27 ENCOUNTER — Encounter (HOSPITAL_COMMUNITY): Payer: Self-pay

## 2020-03-27 ENCOUNTER — Other Ambulatory Visit: Payer: Self-pay

## 2020-03-27 ENCOUNTER — Ambulatory Visit (HOSPITAL_COMMUNITY)
Admission: EM | Admit: 2020-03-27 | Discharge: 2020-03-27 | Disposition: A | Discharge status: Home / Self Care | Payer: Medicare Other | Attending: Family Medicine | Admitting: Family Medicine

## 2020-03-27 DIAGNOSIS — B029 Zoster without complications: Secondary | ICD-10-CM

## 2020-03-27 MED ORDER — VALACYCLOVIR HCL 1 G PO TABS: 1000.0000 mg | ORAL_TABLET | Freq: Three times a day (TID) | ORAL | 0 refills | Status: DC

## 2020-03-27 NOTE — Discharge Instructions (Signed)
May take oral antihistamine such as Zyrtec or Allegra for itching

## 2020-03-27 NOTE — ED Triage Notes (Signed)
Pt c/o rash on right side of facex2 days. PT states it itches. Pt denies new products or meds. Pt has red raised hives on right side of face.

## 2020-03-27 NOTE — ED Provider Notes (Signed)
[facility]    CSN: [id number] Arrival date & time: 03/27/20  0809      History   Chief Complaint Chief Complaint  Patient presents with  . Rash    HPI Andrea Kelley is a 79 y.o. female.   1 day history of urticarial lesions on the right side of her face only.  She denies pain.  She denies any allergic type exposure.  Her only symptom is pruritus.  She has used some topical steroid.  HPI  Past Medical History:  Diagnosis Date  . DDD (degenerative disc disease), lumbar   . Hyperlipidemia   . Osteopenia    [place] [facility]  . Syncope     Patient Active Problem List   Diagnosis Date Noted  . Routine general medical examination at a health care facility 05/27/2018  . Essential (primary) hypertension 05/20/2017  . Syncope 02/09/2017  . DDD (degenerative disc disease), lumbar 01/16/2011  . RESTLESS LEG SYNDROME 06/07/2009  . Hyperglycemia 06/07/2009  . Hyperlipidemia 07/19/2007    Past Surgical History:  Procedure Laterality Date  . ABDOMINAL HYSTERECTOMY     For dysfunctional bleeding; no BSO  . BACK SURGERY  1970   ruptured disc ; Dr Roxan Hockey  . COLONOSCOPY  2001   Negative; done for rectal bleeding  . LOOP RECORDER INSERTION N/A 02/16/2017   Procedure: Loop Recorder Insertion;  Surgeon: Thurmon Fair, MD;  Location: [facility];  Service: Cardiovascular;  Laterality: N/A;    OB History   No obstetric history on file.      Home Medications    Prior to Admission medications   Medication Sig Start Date End Date Taking? Authorizing Provider  augmented betamethasone dipropionate (DIPROLENE-AF) 0.05 % cream APPLY TOPICALLY TO THE AFFECTED AREA TWICE DAILY 12/07/19   [provider]  cephALEXin (KEFLEX) 500 MG capsule Take 500 mg by mouth 3 (three) times daily. 12/07/19   [provider]  losartan (COZAAR) 50 MG tablet TAKE 1 TABLET(50 MG) BY MOUTH DAILY 02/20/20   [place], Betty G, MD  pravastatin (PRAVACHOL) 20 MG tablet TAKE 1  TABLET(20 MG) BY MOUTH DAILY. Need updated lab work for more refills. 05/19/19   [place], Betty G, MD  ropinirole (REQUIP) 5 MG tablet Take 1 tablet (5 mg total) by mouth at bedtime. Must keep schedule appt w/new provider for future refills 12/19/19   [place], Betty G, MD    Family History Family History  Problem Relation Age of Onset  . Lung cancer Father        smoker  . Rheum arthritis Father   . Heart attack Mother        34s  . Stroke Mother        in 8s  . Breast cancer Maternal Aunt   . Diabetes Neg Hx     Social History Social History   Tobacco Use  . Smoking status: Never Smoker  . Smokeless tobacco: Never Used  Substance Use Topics  . Alcohol use: Yes    Alcohol/week: 7.0 standard drinks    Types: 7 Glasses of wine per week  . Drug use: No     Allergies   Sulfonamide derivatives   Review of Systems Review of Systems  Skin: Positive for rash.       On right side of face  All other systems reviewed and are negative.    Physical Exam Triage Vital Signs ED Triage Vitals  Enc Vitals Group     BP 03/27/20  1103 (!) 150/79     Pulse Rate 03/27/20 0835 83     Resp 03/27/20 0835 16     Temp 03/27/20 0835 98.1 F (36.7 C)     Temp Source 03/27/20 0835 Oral     SpO2 03/27/20 0835 98 %     Weight 03/27/20 0836 145 lb (65.8 kg)     Height 03/27/20 0836 5\' 5"  (1.651 m)     Head Circumference --      Peak Flow --      Pain Score 03/27/20 0836 0     Pain Loc --      Pain Edu? --      Excl. in GC? --    No data found.  Updated Vital Signs BP (!) 150/79   Pulse 83   Temp 98.1 F (36.7 C) (Oral)   Resp 16   Ht 5\' 5"  (1.651 m)   Wt 65.8 kg   SpO2 98%   BMI 24.13 kg/m   Visual Acuity Right Eye Distance:   Left Eye Distance:   Bilateral Distance:    Right Eye Near:   Left Eye Near:    Bilateral Near:     Physical Exam Vitals and nursing note reviewed.  Constitutional:      Appearance: Normal appearance.  HENT:     Head: Normocephalic.    Skin:    Comments: Read raised rash on right side of face only.  There is no blistering.  Neurological:     Mental Status: She is alert.      UC Treatments / Results  Labs (all labs ordered are listed, but only abnormal results are displayed) Labs Reviewed - No data to display  EKG   Radiology No results found.  Procedures Procedures (including critical care time)  Medications Ordered in UC Medications - No data to display  Initial Impression / Assessment and Plan / UC Course  I have reviewed the triage vital signs and the nursing notes.  Pertinent labs & imaging results that were available during my care of the patient were reviewed by me and considered in my medical decision making (see chart for details).     Rash on right side of face.  Because it is localized to 1 area and even though it is not associated with pain I believe this is a variant of zoster.  Will treat with Valtrex and oral antihistamine for itching Final Clinical Impressions(s) / UC Diagnoses   Final diagnoses:  None   Discharge Instructions   None    ED Prescriptions    None     PDMP not reviewed this encounter.   03/29/20, MD 03/27/20 [phone]

## 2020-04-16 ENCOUNTER — Encounter: Payer: Self-pay | Admitting: Family Medicine

## 2020-04-26 ENCOUNTER — Encounter (INDEPENDENT_AMBULATORY_CARE_PROVIDER_SITE_OTHER): Payer: Medicare Other | Admitting: Ophthalmology

## 2020-04-26 ENCOUNTER — Other Ambulatory Visit: Payer: Self-pay

## 2020-04-26 DIAGNOSIS — H35033 Hypertensive retinopathy, bilateral: Secondary | ICD-10-CM | POA: Diagnosis not present

## 2020-04-26 DIAGNOSIS — H353112 Nonexudative age-related macular degeneration, right eye, intermediate dry stage: Secondary | ICD-10-CM | POA: Diagnosis not present

## 2020-04-26 DIAGNOSIS — H353221 Exudative age-related macular degeneration, left eye, with active choroidal neovascularization: Secondary | ICD-10-CM | POA: Diagnosis not present

## 2020-04-26 DIAGNOSIS — I10 Essential (primary) hypertension: Secondary | ICD-10-CM

## 2020-04-26 DIAGNOSIS — H43813 Vitreous degeneration, bilateral: Secondary | ICD-10-CM

## 2020-05-19 ENCOUNTER — Other Ambulatory Visit: Payer: Self-pay | Admitting: Family Medicine

## 2020-05-19 DIAGNOSIS — E782 Mixed hyperlipidemia: Secondary | ICD-10-CM

## 2020-06-05 ENCOUNTER — Ambulatory Visit (INDEPENDENT_AMBULATORY_CARE_PROVIDER_SITE_OTHER): Payer: Medicare Other | Admitting: Family Medicine

## 2020-06-05 ENCOUNTER — Other Ambulatory Visit: Payer: Self-pay

## 2020-06-05 ENCOUNTER — Encounter: Payer: Self-pay | Admitting: Family Medicine

## 2020-06-05 VITALS — BP 124/80 | HR 85 | Resp 16 | Ht 65.0 in | Wt 157.4 lb

## 2020-06-05 DIAGNOSIS — E782 Mixed hyperlipidemia: Secondary | ICD-10-CM

## 2020-06-05 DIAGNOSIS — Z0000 Encounter for general adult medical examination without abnormal findings: Secondary | ICD-10-CM | POA: Diagnosis not present

## 2020-06-05 DIAGNOSIS — I10 Essential (primary) hypertension: Secondary | ICD-10-CM | POA: Diagnosis not present

## 2020-06-05 DIAGNOSIS — R7303 Prediabetes: Secondary | ICD-10-CM

## 2020-06-05 MED ORDER — ROPINIROLE HCL 1 MG PO TABS: ORAL_TABLET | ORAL | 0 refills | Status: DC

## 2020-06-05 MED ORDER — SHINGRIX 50 MCG/0.5ML IM SUSR: INTRAMUSCULAR | 1 refills | Status: DC

## 2020-06-05 NOTE — Progress Notes (Signed)
HPI: Ms.Laityn B Rogel is a pleasant 79 y.o. female, who is here today for AWV and follow up.   She was last seen on 12/19/19. She lives alone. Independent ADL's and IADL's.  Functional Status Survey: Is the patient deaf or have difficulty hearing?: No Does the patient have difficulty seeing, even when wearing glasses/contacts?: No Does the patient have difficulty concentrating, remembering, or making decisions?: No Does the patient have difficulty walking or climbing stairs?: No Does the patient have difficulty dressing or bathing?: No Does the patient have difficulty doing errands alone such as visiting a doctor's office or shopping?: No  Fall Risk  09/10/2017 10/24/2013  Falls in the past year? Yes No  Number falls in past yr: 1 -  Injury with Fall? No -   She fell in front of her house about 2 weeks ago. Tripped and fell in between side walk and grass. She was able to get up with no help. Landed on left side,rib cage hit the edge of her side waklk Left rib cage pain was very painful, exacerbated by certain movements, greatly improved.  Providers she sees regularly: Ortho: Dr Junius Roads Cardiologist: Dr Sallyanne Kuster.  Depression screen [facility] 2/9 06/05/2020  Decreased Interest 0  Down, Depressed, Hopeless 0  PHQ - 2 Score 0    Mini-Cog - 06/05/20 0910    Normal clock drawing test? yes    How many words correct? 3           Hearing Screening   '125Hz'$  $Remo'250Hz'fThUD$'500Hz'$'1000Hz'$'2000Hz'$'3000Hz'$'4000Hz'$'6000Hz'$'8000Hz'$   Right ear:   Pass Pass Pass  Pass    Left ear:   Pass Pass Pass  Pass      Visual Acuity Screening   Right eye Left eye Both eyes  Without correction:     With correction: $RemoveBeforeDE'20/25 20/25 20/25 '[id number]$    RLS: She is on Requip 5 mg daily at bedtime. At the end of the day she sits to watch TV and cannot be comfortable because leg discomfort. She wonders if something can be added. Negative for LE edema,pain,or erythema.  HTN: Dx'ed about 4 years ago. She is on Losartan 50 mg  daily. She checks BP occasionally.  Negative for severe/frequent headache, visual changes, chest pain, dyspnea, palpitation, or focal weakness. Heart loop recorder inserted in 02/2017 after episode of syncope.  Remote interrogation q 2-3 months and so far it has been otherwise negative.  Component     Latest Ref Rng & Units 01/24/2020  Sodium     135 - 146 mmol/L 139  Potassium     3.5 - 5.3 mmol/L 4.1  Chloride     98 - 110 mmol/L 108  CO2     20 - 32 mmol/L 25  Glucose     65 - 99 mg/dL 103 (H)  BUN     7 - 25 mg/dL 17  Creatinine     0.60 - 0.88 mg/dL 0.63   HLD: She is on Pravastatin 20 mg daily. Tolerating medication well.  Component     Latest Ref Rng & Units 05/16/2019  Cholesterol     <200 mg/dL 138  Triglycerides     <150 mg/dL 141.0  HDL Cholesterol     > OR = 50 mg/dL 44.50  VLDL     0.0 - 40.0 mg/dL 28.2  Total CHOL/HDL Ratio     <5.0 (calc) 3  NonHDL      93.39   Prediabetes: Negative for  polydipsia,polyuria, or polyphagia. 05/16/19 HgA1C 6.0 (5.8).  She walks her dog daily. In general she tries to follow a healthful diet but eats something sweet after dinner, cookie. Frustrates because wt gain during COVID 19 pandemia and has difficulty losing wt. She wonders about Wt loss med safety.  Review of Systems  Constitutional: Negative for activity change, appetite change, fatigue and fever.  HENT: Negative for mouth sores, nosebleeds and sore throat.   Respiratory: Negative for cough and wheezing.   Gastrointestinal: Negative for abdominal pain, nausea and vomiting.       Negative for changes in bowel habits.  Genitourinary: Negative for decreased urine volume, dysuria and hematuria.  Musculoskeletal: Joint swelling:  sub.  Neurological: Negative for syncope, facial asymmetry, weakness and numbness.  Rest of ROS, see pertinent positives sand negatives in HPI  Current Outpatient Medications on File Prior to Visit  Medication Sig Dispense Refill  .  augmented betamethasone dipropionate (DIPROLENE-AF) 0.05 % cream APPLY TOPICALLY TO THE AFFECTED AREA TWICE DAILY    . losartan (COZAAR) 50 MG tablet TAKE 1 TABLET(50 MG) BY MOUTH DAILY 90 tablet 2  . pravastatin (PRAVACHOL) 20 MG tablet TAKE 1 TABLET(20 MG) BY MOUTH DAILY 90 tablet 0  . ropinirole (REQUIP) 5 MG tablet Take 1 tablet (5 mg total) by mouth at bedtime. Must keep schedule appt w/new provider for future refills 90 tablet 1   No current facility-administered medications on file prior to visit.    Past Medical History:  Diagnosis Date  . DDD (degenerative disc disease), lumbar   . Hyperlipidemia   . Osteopenia    [facility]  . Syncope    Allergies  Allergen Reactions  . Sulfonamide Derivatives Rash    REACTION: rash as child    Social History   Socioeconomic History  . Marital status: Widowed    Spouse name: Not on file  . Number of children: Not on file  . Years of education: Not on file  . Highest education level: Not on file  Occupational History  . 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 Smoking status: Never Smoker  . Smokeless tobacco: Never Used  Substance and Sexual Activity  . Alcohol use: Yes    Alcohol/week: 7.0 standard drinks    Types: 7 Glasses of wine per week  . Drug use: No  . Sexual activity: Not on file  Other Topics Concern  . Not on file  Social History Narrative   Lives w/ Husband in [place] Determinants of Health   Financial Resource Strain:   . Difficulty of Paying Living Expenses: Not on file  Food Insecurity:   . Worried About Charity fundraiser in the Last Year: Not on file  . Ran Out of Food in the Last Year: Not on file  Transportation Needs:   . Lack of Transportation (Medical): Not on file  . Lack of Transportation (Non-Medical): Not on file  Physical Activity:   . Days of Exercise per Week: Not on file  . Minutes of Exercise per Session: Not on file  Stress:     . Feeling of Stress : Not on file  Social Connections:   . Frequency of Communication with Friends and Family: Not on file  . Frequency of Social Gatherings with Friends and Family: Not on file  . Attends Religious Services: Not on file  . Active Member of Clubs or Organizations: Not on file  . Attends Club or  Organization Meetings: Not on file  . Marital Status: Not on file    Vitals:   06/05/20 0853  BP: 124/80  Pulse: 85  Resp: 16  SpO2: 99%   Body mass index is 26.19 kg/m.  Physical Exam Vitals and nursing note reviewed.  Constitutional:      General: She is not in acute distress.    Appearance: She is well-developed.  HENT:     Head: Normocephalic and atraumatic.     Mouth/Throat:     Mouth: Mucous membranes are moist.     Pharynx: Oropharynx is clear.  Eyes:     Conjunctiva/sclera: Conjunctivae normal.     Pupils: Pupils are equal, round, and reactive to light.  Cardiovascular:     Rate and Rhythm: Normal rate and regular rhythm.     Pulses:          Dorsalis pedis pulses are 2+ on the right side and 2+ on the left side.     Heart sounds: No murmur heard.   Pulmonary:     Effort: Pulmonary effort is normal. No respiratory distress.     Breath sounds: Normal breath sounds.  Abdominal:     Palpations: Abdomen is soft. There is no hepatomegaly or mass.     Tenderness: There is no abdominal tenderness.  Lymphadenopathy:     Cervical: No cervical adenopathy.  Skin:    General: Skin is warm.     Findings: No erythema or rash.  Neurological:     Mental Status: She is alert and oriented to person, place, and time.     Cranial Nerves: No cranial nerve deficit.     Gait: Gait normal.  Psychiatric:     Comments: Well groomed, good eye contact.   ASSESSMENT AND PLAN:  Ms. IMAAN PADGETT was seen today for AWV and follow-up.  Orders Placed This Encounter  Procedures  . COMPLETE METABOLIC PANEL WITH GFR  . Hemoglobin A1c  . Lipid panel   Lab Results   Component Value Date   HGBA1C 6.0 (H) 06/05/2020   Lab Results  Component Value Date   CREATININE 0.65 06/05/2020   BUN 17 06/05/2020   NA 139 06/05/2020   K 4.3 06/05/2020   CL 106 06/05/2020   CO2 23 06/05/2020   Lab Results  Component Value Date   ALT 21 06/05/2020   AST 20 06/05/2020   ALKPHOS 92 05/16/2019   BILITOT 0.4 06/05/2020   Lab Results  Component Value Date   CHOL 143 06/05/2020   HDL 44 (L) 06/05/2020   LDLCALC 72 06/05/2020   LDLDIRECT 82.0 05/31/2018   TRIG 196 (H) 06/05/2020   CHOLHDL 3.3 06/05/2020    Medicare annual wellness visit, subsequent We discussed the importance of staying active, physically and mentally, as well as the benefits of a healthy/balance diet. Low impact exercise that involve stretching and strengthing are ideal. Vaccines up to date. We discussed preventive screening for the next 5-10 years, summery of recommendations given in AVS. Fall prevention.  Advance directives and end of life discussed, she has POA and living will.   -     Zoster Vaccine Adjuvanted [facility]) injection; 0.5 ml in muscle and repeat in 8 weeks  Mixed hyperlipidemia Continue Pravastatin 20 mg daily and low fat diet. Further recommendations according to FLP results.  Essential (primary) hypertension BP adequately controlled. Continue Losartan 50 mg daily and low salt diet. Continue monitoring BP regularly.  Prediabetes Healthy lifestyle for primary prevention. Decreasing frequency  of sweets may help with wt loss and therefor with prevention. Caution with OTC wt loss med due to possible side effects.   Return in about 6 months (around 12/04/2020) for HTN and RLS.   Quinnetta Roepke G. [place], MD  [facility]. [place] office.   Ms. Vazguez , Thank you for taking time to come for your Medicare Wellness Visit. I appreciate your ongoing commitment to your health goals. Please review the following plan we discussed and let me know if I can assist  you in the future.   These are the goals we discussed: Goals   None     This is a list of the screening recommended for you and due dates:  Health Maintenance  Topic Date Due  . Tetanus Vaccine  01/01/2025  . Flu Shot  Completed  . DEXA scan (bone density measurement)  Completed  . COVID-19 Vaccine  Completed  . Pneumonia vaccines  Completed   A few things to remember from today's visit:   Mixed hyperlipidemia - Plan: COMPLETE METABOLIC PANEL WITH GFR, Lipid panel  Essential (primary) hypertension  Prediabetes - Plan: Hemoglobin A1c  Today we added a smaller dose of Requip to take around the time you sit to watch TV.  If you need refills please call your pharmacy. Do not use My Chart to request refills or for acute issues that need immediate attention.    Please be sure medication list is accurate. If a new problem present, please set up appointment sooner than planned today. A few tips:  -As we age balance is not as good as it was, so there is a higher risks for falls. Please remove small rugs and furniture that is "in your way" and could increase the risk of falls. Stretching exercises may help with fall prevention: Yoga and Tai Chi are some examples. Low impact exercise is better, so you are not very achy the next day.  -Sun screen and avoidance of direct sun light recommended. Caution with dehydration, if working outdoors be sure to drink enough fluids.  - Some medications are not safe as we age, increases the risk of side effects and can potentially interact with other medication you are also taken;  including some of over the counter medications. Be sure to let me know when you start a new medication even if it is a dietary/vitamin supplement.   -Healthy diet low in red meet/animal fat and sugar + regular physical activity is recommended.

## 2020-06-05 NOTE — Patient Instructions (Addendum)
  Andrea Kelley , Thank you for taking time to come for your Medicare Wellness Visit. I appreciate your ongoing commitment to your health goals. Please review the following plan we discussed and let me know if I can assist you in the future.   These are the goals we discussed: Goals   None     This is a list of the screening recommended for you and due dates:  Health Maintenance  Topic Date Due  . Tetanus Vaccine  01/01/2025  . Flu Shot  Completed  . DEXA scan (bone density measurement)  Completed  . COVID-19 Vaccine  Completed  . Pneumonia vaccines  Completed   A few things to remember from today's visit:   Mixed hyperlipidemia - Plan: COMPLETE METABOLIC PANEL WITH GFR, Lipid panel  Essential (primary) hypertension  Prediabetes - Plan: Hemoglobin A1c  Today we added a smaller dose of Requip to take around the time you sit to watch TV.  If you need refills please call your pharmacy. Do not use My Chart to request refills or for acute issues that need immediate attention.    Please be sure medication list is accurate. If a new problem present, please set up appointment sooner than planned today. A few tips:  -As we age balance is not as good as it was, so there is a higher risks for falls. Please remove small rugs and furniture that is "in your way" and could increase the risk of falls. Stretching exercises may help with fall prevention: Yoga and Tai Chi are some examples. Low impact exercise is better, so you are not very achy the next day.  -Sun screen and avoidance of direct sun light recommended. Caution with dehydration, if working outdoors be sure to drink enough fluids.  - Some medications are not safe as we age, increases the risk of side effects and can potentially interact with other medication you are also taken;  including some of over the counter medications. Be sure to let me know when you start a new medication even if it is a dietary/vitamin  supplement.   -Healthy diet low in red meet/animal fat and sugar + regular physical activity is recommended.

## 2020-06-06 ENCOUNTER — Other Ambulatory Visit: Payer: Self-pay | Admitting: Family Medicine

## 2020-06-06 LAB — HEMOGLOBIN A1C
Hgb A1c MFr Bld: 6 % of total Hgb — ABNORMAL HIGH (ref <5.7)
Mean Plasma Glucose: 126 (calc)
eAG (mmol/L): 7 (calc)

## 2020-06-06 LAB — COMPLETE METABOLIC PANEL WITH GFR
AG Ratio: 1.6 (calc) (ref 1.0–2.5)
ALT: 21 U/L (ref 6–29)
AST: 20 U/L (ref 10–35)
Albumin: 4.1 g/dL (ref 3.6–5.1)
Alkaline phosphatase (APISO): 99 U/L (ref 37–153)
BUN: 17 mg/dL (ref 7–25)
CO2: 23 mmol/L (ref 20–32)
Calcium: 9 mg/dL (ref 8.6–10.4)
Chloride: 106 mmol/L (ref 98–110)
Creat: 0.65 mg/dL (ref 0.60–0.88)
GFR, Est African American: 97 mL/min/{1.73_m2} (ref >=60)
GFR, Est Non African American: 84 mL/min/{1.73_m2} (ref >=60)
Globulin: 2.5 g/dL (calc) (ref 1.9–3.7)
Glucose, Bld: 95 mg/dL (ref 65–99)
Potassium: 4.3 mmol/L (ref 3.5–5.3)
Sodium: 139 mmol/L (ref 135–146)
Total Bilirubin: 0.4 mg/dL (ref 0.2–1.2)
Total Protein: 6.6 g/dL (ref 6.1–8.1)

## 2020-06-06 LAB — LIPID PANEL
Cholesterol: 143 mg/dL (ref <200)
HDL: 44 mg/dL — ABNORMAL LOW (ref >=50)
LDL Cholesterol (Calc): 72 mg/dL (calc)
Non-HDL Cholesterol (Calc): 99 mg/dL (calc) (ref <130)
Total CHOL/HDL Ratio: 3.3 (calc) (ref <5.0)
Triglycerides: 196 mg/dL — ABNORMAL HIGH (ref <150)

## 2020-06-10 ENCOUNTER — Encounter: Payer: Self-pay | Admitting: Family Medicine

## 2020-06-18 ENCOUNTER — Other Ambulatory Visit: Payer: Self-pay | Admitting: Family Medicine

## 2020-06-18 DIAGNOSIS — G2581 Restless legs syndrome: Secondary | ICD-10-CM

## 2020-07-23 ENCOUNTER — Other Ambulatory Visit: Payer: Self-pay | Admitting: Family Medicine

## 2020-08-17 ENCOUNTER — Other Ambulatory Visit: Payer: Self-pay | Admitting: Family Medicine

## 2020-08-17 DIAGNOSIS — E782 Mixed hyperlipidemia: Secondary | ICD-10-CM

## 2020-08-22 ENCOUNTER — Ambulatory Visit (INDEPENDENT_AMBULATORY_CARE_PROVIDER_SITE_OTHER): Payer: Medicare Other | Admitting: Family Medicine

## 2020-08-22 ENCOUNTER — Other Ambulatory Visit: Payer: Self-pay

## 2020-08-22 ENCOUNTER — Ambulatory Visit: Payer: Self-pay

## 2020-08-22 DIAGNOSIS — G8929 Other chronic pain: Secondary | ICD-10-CM | POA: Diagnosis not present

## 2020-08-22 DIAGNOSIS — M5441 Lumbago with sciatica, right side: Secondary | ICD-10-CM | POA: Diagnosis not present

## 2020-08-22 DIAGNOSIS — M25551 Pain in right hip: Secondary | ICD-10-CM

## 2020-08-22 MED ORDER — ETODOLAC 400 MG PO TABS: 400.0000 mg | ORAL_TABLET | Freq: Two times a day (BID) | ORAL | 3 refills | Status: DC | PRN

## 2020-08-22 MED ORDER — TIZANIDINE HCL 2 MG PO TABS: 2.0000 mg | ORAL_TABLET | Freq: Four times a day (QID) | ORAL | 1 refills | Status: DC | PRN

## 2020-08-22 NOTE — Progress Notes (Signed)
Office Visit Note   Patient: Andrea Kelley           Date of Birth: February 27, 1941           MRN: [id number] Visit Date: 08/22/2020 Requested by: [place], Betty G, MD [address] [place],  [place] [address] PCP: [place], Betty G, MD  Subjective: Chief Complaint  Patient presents with  . Right Leg - Pain    Random pain in the leg - sometimes groin area and sometimes in the lateral hip area. Now having pain down into the ankle/foot x 3 weeks now. Has been doing PT off and on since last visit - last had June/2021  . Right Foot - Pain    Some tingling in the anterior ankle and top of foot.     HPI: She is here with recurrent right posterior hip and leg pain.  When I last saw her, she went to physical therapy and her pain went away completely.  Last spring it started to come back and now is getting worse.  Sometimes she cannot sleep at night, sometimes the pain goes all the way down to the wound and causes a numbness and tingling sensation on top of her foot.  She has not noticed any weakness, no bowel or bladder dysfunction.  Medications really do not help.                ROS:   All other systems were reviewed and are negative.  Objective: Vital Signs: There were no vitals taken for this visit.  Physical Exam:  General:  Alert and oriented, in no acute distress. Pulm:  Breathing unlabored. Psy:  Normal mood, congruent affect.  Low back: She has slight tenderness on the posterior aspect of the right greater trochanter.  Straight leg raise is negative, no pain with internal hip rotation and her range of motion is good.  Lower extremity strength and reflexes are normal.  Imaging: XR HIP UNILAT W OR W/O PELVIS 2-3 VIEWS RIGHT  Result Date: 08/22/2020 X-rays of the right hip reveal normal bony anatomy, no significant degenerative change.  XR Lumbar Spine 2-3 Views  Result Date: 08/22/2020 X-rays of the lumbar spine reveal severe disc space narrowing at L5-S1 and mild to moderate  at multiple other levels.  No sign of compression fracture or neoplasm.   Assessment & Plan: 1. 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 -She will resume physical therapy.  If symptoms persist, then either epidural injection or MRI scan. -Trial of Lodine and Zanaflex as needed.     Procedures: No procedures performed        PMFS History: Patient Active Problem List   Diagnosis Date Noted  . Routine general medical examination at a health care facility 05/27/2018  . Essential (primary) hypertension 05/20/2017  . Syncope 02/09/2017  . DDD (degenerative disc disease), lumbar 01/16/2011  . RESTLESS LEG SYNDROME 06/07/2009  . Hyperglycemia 06/07/2009  . Hyperlipidemia 07/19/2007   Past Medical History:  Diagnosis Date  . DDD (degenerative disc disease), lumbar   . Hyperlipidemia   . Osteopenia    [facility]  . Syncope     Family History  Problem Relation Age of Onset  . Lung cancer Father        smoker  . Rheum arthritis Father   . Heart attack Mother        56s  . Stroke Mother        in 50s  .  Breast cancer Maternal Aunt   . Diabetes Neg Hx     Past Surgical History:  Procedure Laterality Date  . ABDOMINAL HYSTERECTOMY     For dysfunctional bleeding; no BSO  . BACK SURGERY  1970   ruptured disc ; Dr Roxan Hockey  . COLONOSCOPY  2001   Negative; done for rectal bleeding  . LOOP RECORDER INSERTION N/A 02/16/2017   Procedure: Loop Recorder Insertion;  Surgeon: Thurmon Fair, MD;  Location: [facility];  Service: Cardiovascular;  Laterality: N/A;   Social History   Occupational History  . Occupation: Retired Landscape architect, trained in Facilities manager therapy  Tobacco Use  . Smoking status: Never Smoker  . Smokeless tobacco: Never Used  Substance and Sexual Activity  . Alcohol use: Yes    Alcohol/week: 7.0 standard drinks    Types: 7 Glasses of wine per week  . Drug use: No  . Sexual activity: Not on file

## 2020-09-10 ENCOUNTER — Other Ambulatory Visit: Payer: Self-pay

## 2020-09-10 ENCOUNTER — Ambulatory Visit: Payer: Medicare Other | Attending: Family Medicine

## 2020-09-10 DIAGNOSIS — G8929 Other chronic pain: Secondary | ICD-10-CM | POA: Insufficient documentation

## 2020-09-10 DIAGNOSIS — M5441 Lumbago with sciatica, right side: Secondary | ICD-10-CM | POA: Insufficient documentation

## 2020-09-10 DIAGNOSIS — M6281 Muscle weakness (generalized): Secondary | ICD-10-CM | POA: Diagnosis not present

## 2020-09-10 DIAGNOSIS — R252 Cramp and spasm: Secondary | ICD-10-CM | POA: Diagnosis not present

## 2020-09-10 NOTE — Therapy (Signed)
[facility] [address] [place], [place], [address] Phone: [phone]   Fax:  [phone]  Physical Therapy Evaluation  Patient Details  Name: Andrea Kelley MRN: [id number] Date of Birth: [birth year] Referring Provider (PT): Lavada Mesi, MD   Encounter Date: 09/10/2020   PT End of Session - 09/10/20 0927    Visit Number 1    Date for PT Re-Evaluation 11/05/20    Authorization Type BCBS Medicare    PT Start Time 0846    PT Stop Time 0929    PT Time Calculation (min) 43 min    Activity Tolerance Patient tolerated treatment well    Behavior During Therapy [facility] for tasks assessed/performed           Past Medical History:  Diagnosis Date  . DDD (degenerative disc disease), lumbar   . Hyperlipidemia   . Osteopenia    [place] [facility]  . Syncope     Past Surgical History:  Procedure Laterality Date  . ABDOMINAL HYSTERECTOMY     For dysfunctional bleeding; no BSO  . BACK SURGERY  1970   ruptured disc ; Dr Roxan Hockey  . COLONOSCOPY  2001   Negative; done for rectal bleeding  . LOOP RECORDER INSERTION N/A 02/16/2017   Procedure: Loop Recorder Insertion;  Surgeon: Thurmon Fair, MD;  [facility];  Service: Cardiovascular;  Laterality: N/A;    There were no vitals filed for this visit.    Subjective Assessment - 09/10/20 0900    Subjective Pt presents to PT with intermittent Rt LE pain that began early January 2022.  Pt reports that the pain radiates to the Rt foot.  Pt had pain in Rt gluteals last year and this resolved with PT at this clinic.    Pertinent History lumbar sugery 1970.    Limitations Standing;Walking    How long can you stand comfortably? this varies: intermittent limitation in standing tolerance    How long can you walk comfortably? this varies: intermittent    Diagnostic tests x-ray: L5-S1 stenosis    Patient Stated Goals reduce Rt LE pain    Currently in Pain? 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  No    Is the patient reluctant to leave their  home because of a fear of falling?  No      Home Environment   Living Environment Private residence    Living Arrangements Alone    Type of Home House    Home Access Stairs to enter    Entrance Stairs-Number of Steps 4    Home Layout One level    Home Equipment None      Prior Function   Level of Independence Independent    Vocation Retired    Leisure reading      Cognition   Overall Cognitive Status Within Functional Limits for tasks assessed      Observation/Other Assessments   Focus on Therapeutic Outcomes (FOTO)  44 (goal is 63)      Posture/Postural Control   Posture/Postural Control Postural limitations    Postural Limitations Flexed trunk      ROM / Strength   AROM / PROM / Strength AROM;PROM;Strength      AROM   Overall AROM  Deficits    Overall AROM Comments lumbar A/ROM is full with Rt LE pain with Rt sidebending and extension.  Rt hip ER limited by 30% vs the Lt.      PROM   Overall PROM   Within functional limits for tasks performed    Overall PROM Comments Rt=Lt with Rt gluteal pain at end range ER      Strength   Overall Strength Deficits    Overall Strength Comments Rt hip abduction 4/5.  All other hip strength 4+/5      Palpation   Spinal mobility reduced PA mobility without pain in the lumbar spine.    Palpation comment trigger points and tension in Rt proximal gluteals  and Rt>Lt lumbar paraspinals      Special Tests    Special Tests Lumbar    Lumbar Tests Straight Leg Raise      Straight Leg Raise   Findings Positive    Side  Right      Transfers   Transfers Independent with all Transfers      Ambulation/Gait   Ambulation/Gait Yes    Gait Pattern Step-through pattern;Trendelenburg                      Objective measurements completed on examination: See above findings.               PT Education - 09/10/20 0926    Education Details Access Code: [email or web address]    Person(s) Educated Patient    Methods Explanation;Demonstration;Handout    Comprehension Verbalized understanding;Returned demonstration            PT Short Term Goals - 09/10/20 0859      PT SHORT TERM GOAL #1   Title independent with initial HEP    Time 4    Period Weeks    Status New    Target Date 10/08/20      PT SHORT TERM GOAL #2   Title report a 30% reduction in Rt LE pain with standing and walking    Baseline --    Time 4    Period Weeks    Status New    Target Date 10/08/20      PT SHORT TERM GOAL #3   Title initiate a walking program with her dog for short distances and advance as able    Time 4    Period Weeks    Status New    Target Date 10/08/20  PT SHORT TERM GOAL #4   Title report a 25% improvement in abilty to fall asleep at night due to reduced Rt LE pain    Time 4    Period Weeks    Status New    Target Date 10/08/20             PT Long Term Goals - 09/10/20 0906      PT LONG TERM GOAL #1   Title independent with HEP     Time 8    Period Weeks    Status New    Target Date 11/05/20      PT LONG TERM GOAL #2   Title report ability to walk dog for at least 3/4 mile without increase in pain for improved function    Time 8    Period Weeks    Status New    Target Date 11/05/20      PT LONG TERM GOAL #3   Title improve FOTO to > or = to 63 to improve function    Time 8    Period Weeks    Status New    Target Date 11/05/20      PT LONG TERM GOAL #4   Title report a 60% reduction in Rt LE radiculopathy with stading and walking to improve tolerance for this    Time 8    Period Weeks    Status New    Target Date 11/05/20      PT LONG TERM GOAL #5   Title report reduced Rt LE pain to allow 50% greater ease with falling asleep at night    Time 8    Period Weeks    Status New    Target Date 11/05/20      Additional Long Term Goals   Additional Long Term Goals Yes      PT LONG TERM GOAL #6   Title demonstrate 4+/5 Rt hip abduction to improve endurance and pelvic alignment with walking    Time 8    Period Weeks    Status New    Target Date 11/05/20                  Plan - 09/10/20 1006    Clinical Impression Statement Pt presents to PT with flare-up of Rt LE and groin pain.  Pt was treated at this clinic 11/17/19-01/12/20 for Rt gluteal pain and had a good result.  Recent x-ray showed narrowing at L5-S1.  Pain is now more radicular in nature and pt describes up to 10/10 Rt LE pain that is shooting into the foot.  This pain is intermittent and pt arrived without pain.  Pain increases with laying down at night and pt has a hard time getting to sleep.  Pain is also increased with and limits standing and walking.  Pt demonstrates Trendelenburg gait and reduced Rt hip abduction strength.  Pt with reproduction of Rt LE pain with SLR test, lumbar extension and Rt side bending, and pt has mild trigger points in Rt gluteals and Rt>Lt lumbar paraspinals. Pt will benefit from skilled PT to address Rt LE  radiculopathy and Rt hip weakness.    Personal Factors and Comorbidities Comorbidity 1    Comorbidities lumbar surgery 1970 and lumbar stenosis L5/s1 now    Examination-Activity Limitations Carry;Sleep;Locomotion Level;Stairs    Examination-Participation Restrictions Meal Prep;Community Activity;Shop;Laundry    Stability/Clinical Decision Making Stable/Uncomplicated    Clinical Decision Making Low    Rehab Potential Good  PT Frequency 2x / week    PT Duration 8 weeks    PT Treatment/Interventions ADLs/Self Care Home Management;Cryotherapy;Electrical Stimulation;Traction;Gait training;Stair training;Functional mobility training;Therapeutic activities;Therapeutic exercise;Balance training;Neuromuscular re-education;Patient/family education;Manual techniques;Dry needling;Joint Manipulations    PT Next Visit Plan review HEP, consider lumbar traction, core and hip strength, dry needling to lumbar and Rt gluteals    PT Home Exercise Plan Access Code: [email or web address]    Consulted and Agree with Plan of Care Patient           Patient will benefit from skilled therapeutic intervention in order to improve the following deficits and impairments:  Abnormal gait,Decreased activity tolerance,Decreased strength,Impaired flexibility,Pain,Decreased range of motion,Increased muscle spasms,Decreased endurance,Postural dysfunction  Visit Diagnosis: Muscle weakness (generalized) - Plan: PT plan of care cert/re-cert  Cramp and spasm - Plan: PT plan of care cert/re-cert  Chronic right-sided low back pain with right-sided sciatica - Plan: PT plan of care cert/re-cert     Problem List Patient Active Problem List   Diagnosis Date Noted  . Routine general medical examination at a health care facility 05/27/2018  . Essential (primary) hypertension 05/20/2017  . Syncope 02/09/2017  . DDD (degenerative disc disease), lumbar 01/16/2011  . RESTLESS LEG SYNDROME 06/07/2009  . Hyperglycemia 06/07/2009  .  Hyperlipidemia 07/19/2007     Lorrene Reid, PT 09/10/20 10:16 AM  [facility] [address], [address] [place], [place], [address] Phone: [phone]   Fax:  [phone]  Name: Andrea Kelley MRN: [id number] Date of Birth: January 25, 1941

## 2020-09-10 NOTE — Patient Instructions (Signed)
Access Code: [email or web address] URL: [email or web address] Date: 09/10/2020 Prepared by: Tresa Endo  Exercises Supine Double Knee to Chest - 2 x daily - 7 x weekly - 1 sets - 3 reps - 20-30 hold Hooklying Single Knee to Chest Stretch - 2 x daily - 7 x weekly - 1 sets - 3 reps - 20-30 hold Supine Sciatic Nerve Glide - 3 x daily - 7 x weekly - 1 sets - 5 reps - 10 hold Seated Figure 4 Piriformis Stretch - 3 x daily - 7 x weekly - 1 sets - 3 reps - 20 hold Seated Hamstring Stretch - 2 x daily - 7 x weekly - 1 sets - 3 reps - 20 hold

## 2020-09-12 ENCOUNTER — Other Ambulatory Visit: Payer: Self-pay

## 2020-09-12 ENCOUNTER — Encounter: Payer: Self-pay | Admitting: Physical Therapy

## 2020-09-12 ENCOUNTER — Ambulatory Visit: Payer: Medicare Other | Attending: Family Medicine | Admitting: Physical Therapy

## 2020-09-12 DIAGNOSIS — R252 Cramp and spasm: Secondary | ICD-10-CM | POA: Insufficient documentation

## 2020-09-12 DIAGNOSIS — G8929 Other chronic pain: Secondary | ICD-10-CM

## 2020-09-12 DIAGNOSIS — M6281 Muscle weakness (generalized): Secondary | ICD-10-CM | POA: Diagnosis not present

## 2020-09-12 DIAGNOSIS — M25551 Pain in right hip: Secondary | ICD-10-CM | POA: Diagnosis not present

## 2020-09-12 DIAGNOSIS — M5441 Lumbago with sciatica, right side: Secondary | ICD-10-CM | POA: Diagnosis not present

## 2020-09-12 DIAGNOSIS — R29898 Other symptoms and signs involving the musculoskeletal system: Secondary | ICD-10-CM | POA: Diagnosis not present

## 2020-09-12 NOTE — Patient Instructions (Signed)
Access Code: [email or web address] URL: [email or web address] Date: 09/12/2020 Prepared by: [name]  Exercises Supine Double Knee to Chest - 2 x daily - 7 x weekly - 1 sets - 3 reps - 20-30 hold Hooklying Single Knee to Chest Stretch - 2 x daily - 7 x weekly - 1 sets - 3 reps - 20-30 hold Supine Sciatic Nerve Glide - 3 x daily - 7 x weekly - 1 sets - 5 reps - 10 hold Seated Hamstring Stretch - 2 x daily - 7 x weekly - 1 sets - 3 reps - 20 hold Supine Piriformis Stretch with Leg Straight - 2 x daily - 7 x weekly - 3 reps - 1 sets - 30 sec hold Sidelying ITB Stretch off Table - 2 x daily - 7 x weekly - 1 sets - 3 reps - 60 seconds hold Sidelying Hip Abduction - 1 x daily - 7 x weekly - 3 sets - 10 reps

## 2020-09-12 NOTE — Therapy (Signed)
[facility] [address], [address] [place], [place], [address] Phone: [phone]   Fax:  [phone]  Physical Therapy Treatment  Patient Details  Name: Andrea Kelley MRN: [id number] Date of Birth: 04/17/1941 Referring Provider (PT): Lavada Mesi, MD   Encounter Date: 09/12/2020   PT End of Session - 09/12/20 0934    Visit Number 2    Date for PT Re-Evaluation 11/05/20    Authorization Type BCBS Medicare    PT Start Time [phone]    PT Stop Time 1014    PT Time Calculation (min) 40 min    Activity Tolerance Patient tolerated treatment well    Behavior During Therapy [facility] for tasks assessed/performed           Past Medical History:  Diagnosis Date  . DDD (degenerative disc disease), lumbar   . Hyperlipidemia   . Osteopenia    [name]  . Syncope     Past Surgical History:  Procedure Laterality Date  . ABDOMINAL HYSTERECTOMY     For dysfunctional bleeding; no BSO  . BACK SURGERY  1970   ruptured disc ; Dr Roxan Hockey  . COLONOSCOPY  2001   Negative; done for rectal bleeding  . LOOP RECORDER INSERTION N/A 02/16/2017   Procedure: Loop Recorder Insertion;  Surgeon: Thurmon Fair, MD;  [facility];  Service: Cardiovascular;  Laterality: N/A;    There were no vitals filed for this visit.   Subjective Assessment - 09/12/20 0934    Subjective Some pain in right groin. Worse time is at night trying to find position that is comfortable    Patient Stated Goals reduce Rt LE pain    Currently in Pain? Yes    Pain Score 4     Pain Location Groin    Pain Orientation Right                             [facility] Adult PT Treatment/Exercise - 09/12/20 0001      Exercises   Exercises Knee/Hip      Knee/Hip Exercises: Stretches   Passive Hamstring Stretch Both;1 rep;30 seconds    Passive Hamstring Stretch Limitations seated    ITB Stretch Right;2 reps;60 seconds    ITB Stretch  Limitations left SDLY over pillow for QL stretch    Piriformis Stretch Right;2 reps;30 seconds    Piriformis Stretch Limitations cross body right knee bent    Other Knee/Hip Stretches SKTC and DKTC x 30 sec ea    Other Knee/Hip Stretches supine nerve glide x 5      Knee/Hip Exercises: Supine   Bridges Right;10 reps      Knee/Hip Exercises: Sidelying   Hip ABduction Right;10 reps    Clams right 10 reps      Manual Therapy   Manual Therapy Manual Traction;Soft tissue mobilization;Joint mobilization    Joint Mobilization UPA mobs to bil lumbar Gd 3   painful at right L2/3 and L3/4, stiff at L1/2 less pain   Soft tissue mobilization to right lumbar/QL in prone and left SDLY over pillow    Manual Traction right long leg traction 2x20 sec                  PT Education - 09/12/20 1216    Education Details HEP progressed    Person(s) Educated Patient    Methods Explanation;Demonstration;Handout    Comprehension Verbalized understanding;Returned demonstration  PT Short Term Goals - 09/10/20 0859      PT SHORT TERM GOAL #1   Title independent with initial HEP    Time 4    Period Weeks    Status New    Target Date 10/08/20      PT SHORT TERM GOAL #2   Title report a 30% reduction in Rt LE pain with standing and walking    Baseline --    Time 4    Period Weeks    Status New    Target Date 10/08/20      PT SHORT TERM GOAL #3   Title initiate a walking program with her dog for short distances and advance as able    Time 4    Period Weeks    Status New    Target Date 10/08/20      PT SHORT TERM GOAL #4   Title report a 25% improvement in abilty to fall asleep at night due to reduced Rt LE pain    Time 4    Period Weeks    Status New    Target Date 10/08/20             PT Long Term Goals - 09/10/20 0906      PT LONG TERM GOAL #1   Title independent with HEP    Time 8    Period Weeks    Status New    Target Date 11/05/20      PT LONG TERM  GOAL #2   Title report ability to walk dog for at least 3/4 mile without increase in pain for improved function    Time 8    Period Weeks    Status New    Target Date 11/05/20      PT LONG TERM GOAL #3   Title improve FOTO to > or = to 63 to improve function    Time 8    Period Weeks    Status New    Target Date 11/05/20      PT LONG TERM GOAL #4   Title report a 60% reduction in Rt LE radiculopathy with stading and walking to improve tolerance for this    Time 8    Period Weeks    Status New    Target Date 11/05/20      PT LONG TERM GOAL #5   Title report reduced Rt LE pain to allow 50% greater ease with falling asleep at night    Time 8    Period Weeks    Status New    Target Date 11/05/20      Additional Long Term Goals   Additional Long Term Goals Yes      PT LONG TERM GOAL #6   Title demonstrate 4+/5 Rt hip abduction to improve endurance and pelvic alignment with walking    Time 8    Period Weeks    Status New    Target Date 11/05/20                 Plan - 09/12/20 1222    Clinical Impression Statement Patient returns for first f/u visit after eval. She was not able to tolerate the figure 4 stretch so we modified to supine cross body stretch. HEP reviewed and progressed with QL stretch and SDLY hip ABD. She is very tight in right lumbar and QL and responded well to [facility] here. She may benefit from DN here as well.  Comorbidities lumbar surgery 1970 and lumbar stenosis L5/s1 now    PT Treatment/Interventions ADLs/Self Care Home Management;Cryotherapy;Electrical Stimulation;Traction;Gait training;Stair training;Functional mobility training;Therapeutic activities;Therapeutic exercise;Balance training;Neuromuscular re-education;Patient/family education;Manual techniques;Dry needling;Joint Manipulations    PT Next Visit Plan consider lumbar traction, core and hip strength, continue manual/ dry needling to lumbar and Rt gluteals    PT Home Exercise Plan Access Code:  [email or web address]           Patient will benefit from skilled therapeutic intervention in order to improve the following deficits and impairments:  Abnormal gait,Decreased activity tolerance,Decreased strength,Impaired flexibility,Pain,Decreased range of motion,Increased muscle spasms,Decreased endurance,Postural dysfunction  Visit Diagnosis: Chronic right-sided low back pain with right-sided sciatica  Cramp and spasm  Muscle weakness (generalized)  Pain in right hip  Other symptoms and signs involving the musculoskeletal system     Problem List Patient Active Problem List   Diagnosis Date Noted  . Routine general medical examination at a health care facility 05/27/2018  . Essential (primary) hypertension 05/20/2017  . Syncope 02/09/2017  . DDD (degenerative disc disease), lumbar 01/16/2011  . RESTLESS LEG SYNDROME 06/07/2009  . Hyperglycemia 06/07/2009  . Hyperlipidemia 07/19/2007    Solon Palm PT 09/12/2020, 12:25 PM  [facility] [address] [place], [place], [address] Phone: [phone]   Fax:  [phone]  Name: Andrea Kelley MRN: [id number] Date of Birth: 03/10/1941

## 2020-09-25 ENCOUNTER — Ambulatory Visit: Payer: Medicare Other

## 2020-09-25 ENCOUNTER — Other Ambulatory Visit: Payer: Self-pay

## 2020-09-25 DIAGNOSIS — R29898 Other symptoms and signs involving the musculoskeletal system: Secondary | ICD-10-CM | POA: Diagnosis not present

## 2020-09-25 DIAGNOSIS — G8929 Other chronic pain: Secondary | ICD-10-CM

## 2020-09-25 DIAGNOSIS — M5441 Lumbago with sciatica, right side: Secondary | ICD-10-CM | POA: Diagnosis not present

## 2020-09-25 DIAGNOSIS — M6281 Muscle weakness (generalized): Secondary | ICD-10-CM

## 2020-09-25 DIAGNOSIS — R252 Cramp and spasm: Secondary | ICD-10-CM | POA: Diagnosis not present

## 2020-09-25 DIAGNOSIS — M25551 Pain in right hip: Secondary | ICD-10-CM | POA: Diagnosis not present

## 2020-09-25 NOTE — Therapy (Signed)
[facility] [address], [address] [place], [place], [address] Phone: [phone]   Fax:  [phone]  Physical Therapy Treatment  Patient Details  Name: Andrea Kelley MRN: [id number] Date of Birth: Nov 20, 1940 Referring Provider (PT): Lavada Mesi, MD   Encounter Date: 09/25/2020   PT End of Session - 09/25/20 0803    Visit Number 3    Date for PT Re-Evaluation 11/05/20    Authorization Type BCBS Medicare    PT Start Time 0728    PT Stop Time 0802    PT Time Calculation (min) 34 min    Activity Tolerance Patient tolerated treatment well    Behavior During Therapy [facility] for tasks assessed/performed           Past Medical History:  Diagnosis Date  . DDD (degenerative disc disease), lumbar   . Hyperlipidemia   . Osteopenia     [place]  . Syncope     Past Surgical History:  Procedure Laterality Date  . ABDOMINAL HYSTERECTOMY     For dysfunctional bleeding; no BSO  . BACK SURGERY  1970   ruptured disc ; Dr Roxan Hockey  . COLONOSCOPY  2001   Negative; done for rectal bleeding  . LOOP RECORDER INSERTION N/A 02/16/2017   Procedure: Loop Recorder Insertion;  Surgeon: Thurmon Fair, MD;  [facility];  Service: Cardiovascular;  Laterality: N/A;    There were no vitals filed for this visit.   Subjective Assessment - 09/25/20 0731    Subjective I am feeling better.  No pain over the past few days.    Currently in Pain? No/denies                             [facility] Adult PT Treatment/Exercise - 09/25/20 0001      Knee/Hip Exercises: Stretches   Active Hamstring Stretch Both;2 reps;20 seconds    Piriformis Stretch Right;2 reps;30 seconds    Piriformis Stretch Limitations cross body right knee bent    Other Knee/Hip Stretches SKTC and DKTC x 30 sec ea      Knee/Hip Exercises: Aerobic   Elliptical Level 1x 6 minutes- PT present to discuss progress      Knee/Hip Exercises:  Sidelying   Hip ABduction Right;10 reps      Manual Therapy   Manual Therapy Manual Traction;Soft tissue mobilization;Joint mobilization    Joint Mobilization UPA mobs to bil lumbar Gd 3    Soft tissue mobilization to right lumbar/QL in prone    Manual Traction right long leg traction 4x20 sec                    PT Short Term Goals - 09/25/20 0733      PT SHORT TERM GOAL #1   Title independent with initial HEP    Status Achieved      PT SHORT TERM GOAL #2   Title report a 30% reduction in Rt LE pain with standing and walking    Baseline 90-95% better this week- haven't been doing a lot lately    Period Weeks    Status On-going      PT SHORT TERM GOAL #3   Title initiate a walking program with her dog for short distances and advance as able    Baseline has tried a couple of times    Time 4    Status On-going  PT Long Term Goals - 09/10/20 0906      PT LONG TERM GOAL #1   Title independent with HEP    Time 8    Period Weeks    Status New    Target Date 11/05/20      PT LONG TERM GOAL #2   Title report ability to walk dog for at least 3/4 mile without increase in pain for improved function    Time 8    Period Weeks    Status New    Target Date 11/05/20      PT LONG TERM GOAL #3   Title improve FOTO to > or = to 63 to improve function    Time 8    Period Weeks    Status New    Target Date 11/05/20      PT LONG TERM GOAL #4   Title report a 60% reduction in Rt LE radiculopathy with stading and walking to improve tolerance for this    Time 8    Period Weeks    Status New    Target Date 11/05/20      PT LONG TERM GOAL #5   Title report reduced Rt LE pain to allow 50% greater ease with falling asleep at night    Time 8    Period Weeks    Status New    Target Date 11/05/20      Additional Long Term Goals   Additional Long Term Goals Yes      PT LONG TERM GOAL #6   Title demonstrate 4+/5 Rt hip abduction to improve endurance and  pelvic alignment with walking    Time 8    Period Weeks    Status New    Target Date 11/05/20                 Plan - 09/25/20 0743    Clinical Impression Statement Pt reports 90% overall improvement in symptoms in Rt LE since the start of care. Pt denies pain today and is independent with HEP for flexibility and strength.  Pt has been taking shorter walks with her dog and is able to walk 3 long blocks. Pt required tactile and max verbal cues for hip abduction in sidelying to reduced subsitution with hip flexors.  Pt with tension in Rt lumbar spine with unilateral PA mobs and elongation today.  Pt will be taking a trip to [place] and will be walking a lot.  She will continue to stretch on this trip and return to PT after.  Pt will continue to benefit from skilled PT to address stenosis and Rt LE pain.    PT Frequency 2x / week    PT Duration 8 weeks    PT Treatment/Interventions ADLs/Self Care Home Management;Cryotherapy;Electrical Stimulation;Traction;Gait training;Stair training;Functional mobility training;Therapeutic activities;Therapeutic exercise;Balance training;Neuromuscular re-education;Patient/family education;Manual techniques;Dry needling;Joint Manipulations    PT Next Visit Plan See how pt is feeling after trip.  Manual, strength, flexibility dry needling as needed    PT Home Exercise Plan Access Code: [email or web address]    Consulted and Agree with Plan of Care Patient           Patient will benefit from skilled therapeutic intervention in order to improve the following deficits and impairments:  Abnormal gait,Decreased activity tolerance,Decreased strength,Impaired flexibility,Pain,Decreased range of motion,Increased muscle spasms,Decreased endurance,Postural dysfunction  Visit Diagnosis: Chronic right-sided low back pain with right-sided sciatica  Cramp and spasm  Muscle weakness (generalized)  Pain in right hip  Problem List Patient Active Problem List    Diagnosis Date Noted  . Routine general medical examination at a health care facility 05/27/2018  . Essential (primary) hypertension 05/20/2017  . Syncope 02/09/2017  . DDD (degenerative disc disease), lumbar 01/16/2011  . RESTLESS LEG SYNDROME 06/07/2009  . Hyperglycemia 06/07/2009  . Hyperlipidemia 07/19/2007     Lorrene Reid, PT 09/25/20 8:14 AM  [facility] [address] [place], [place], [address] Phone: [phone]   Fax:  [phone]  Name: Andrea Kelley MRN: [id number] Date of Birth: 07/18/1941

## 2020-10-02 ENCOUNTER — Ambulatory Visit: Payer: Medicare Other

## 2020-10-02 ENCOUNTER — Other Ambulatory Visit: Payer: Self-pay

## 2020-10-02 DIAGNOSIS — M25551 Pain in right hip: Secondary | ICD-10-CM

## 2020-10-02 DIAGNOSIS — M5441 Lumbago with sciatica, right side: Secondary | ICD-10-CM | POA: Diagnosis not present

## 2020-10-02 DIAGNOSIS — R252 Cramp and spasm: Secondary | ICD-10-CM

## 2020-10-02 DIAGNOSIS — R29898 Other symptoms and signs involving the musculoskeletal system: Secondary | ICD-10-CM | POA: Diagnosis not present

## 2020-10-02 DIAGNOSIS — M6281 Muscle weakness (generalized): Secondary | ICD-10-CM | POA: Diagnosis not present

## 2020-10-02 DIAGNOSIS — G8929 Other chronic pain: Secondary | ICD-10-CM

## 2020-10-02 NOTE — Therapy (Signed)
[facility] [address] [place], [place], [address] Phone: [phone]   Fax:  [phone]  Physical Therapy Treatment  Patient Details  Name: Andrea Kelley MRN: [id number] Date of Birth: 1941-02-28 Referring Provider (PT): Lavada Mesi, MD   Encounter Date: 10/02/2020   PT End of Session - 10/02/20 0843    Visit Number 4    Date for PT Re-Evaluation 11/05/20    Authorization Type BCBS Medicare    PT Start Time 0800    PT Stop Time 0842    PT Time Calculation (min) 42 min    Activity Tolerance Patient tolerated treatment well    Behavior During Therapy [facility] for tasks assessed/performed           Past Medical History:  Diagnosis Date  . DDD (degenerative disc disease), lumbar   . Hyperlipidemia   . Osteopenia    [place]  . Syncope     Past Surgical History:  Procedure Laterality Date  . ABDOMINAL HYSTERECTOMY     For dysfunctional bleeding; no BSO  . BACK SURGERY  1970   ruptured disc ; Dr Roxan Hockey  . COLONOSCOPY  2001   Negative; done for rectal bleeding  . LOOP RECORDER INSERTION N/A 02/16/2017   Procedure: Loop Recorder Insertion;  Surgeon: Thurmon Fair, MD;  [facility];  Service: Cardiovascular;  Laterality: N/A;    There were no vitals filed for this visit.   Subjective Assessment - 10/02/20 0809    Subjective I did really well on my trip to [place].  I walked a lot and wasn't limited.    Currently in Pain? No/denies                             [facility] Adult PT Treatment/Exercise - 10/02/20 0001      Knee/Hip Exercises: Stretches   Active Hamstring Stretch Both;2 reps;20 seconds    Active Hamstring Stretch Limitations supine with strap and seated    Piriformis Stretch Right;2 reps;30 seconds    Piriformis Stretch Limitations cross body right knee bent      Knee/Hip Exercises: Aerobic   Elliptical --    Nustep Level 2x 10 minutes- PT  present to discuss progress      Knee/Hip Exercises: Seated   Sit to [employer] 15 reps      Knee/Hip Exercises: Supine   Straight Leg Raises Strengthening;Both;2 sets;10 reps    Straight Leg Raises Limitations with bracing      Knee/Hip Exercises: Sidelying   Clams 2x10 bil with bracing                    PT Short Term Goals - 09/25/20 0733      PT SHORT TERM GOAL #1   Title independent with initial HEP    Status Achieved      PT SHORT TERM GOAL #2   Title report a 30% reduction in Rt LE pain with standing and walking    Baseline 90-95% better this week- haven't been doing a lot lately    Period Weeks    Status On-going      PT SHORT TERM GOAL #3   Title initiate a walking program with her dog for short distances and advance as able    Baseline has tried a couple of times    Time 4    Status On-going  PT Long Term Goals - 10/02/20 1093      PT LONG TERM GOAL #1   Title independent with HEP    Status On-going      PT LONG TERM GOAL #4   Title report a 60% reduction in Rt LE radiculopathy with stading and walking to improve tolerance for this    Status Achieved      PT LONG TERM GOAL #5   Title report reduced Rt LE pain to allow 50% greater ease with falling asleep at night    Status Achieved                 Plan - 10/02/20 0818    Clinical Impression Statement Pt was in [place] over the weekend and was able to walk and stand without limitation.  Pt was able to walk > 1 mile at a time without increased pain. Pt denies any leg pain today.  Pt has not been able to perform HEP due to travel so session focused on completing exercises.  Pt required minor tactile and verbal cueing for technique.  Pt will continue to benefit from skilled PT to address LE pain and improve flexibility and lumbopelvic strength.    PT Frequency 2x / week    PT Duration 8 weeks    PT Treatment/Interventions ADLs/Self Care Home Management;Cryotherapy;Electrical  Stimulation;Traction;Gait training;Stair training;Functional mobility training;Therapeutic activities;Therapeutic exercise;Balance training;Neuromuscular re-education;Patient/family education;Manual techniques;Dry needling;Joint Manipulations    PT Next Visit Plan continue to work on lumbpelvic strength, flexiblity and manual for pain management as needed    PT Home Exercise Plan Access Code: [email or web address]    Recommended Other Services initial cert is signed    Consulted and Agree with Plan of Care Patient           Patient will benefit from skilled therapeutic intervention in order to improve the following deficits and impairments:  Abnormal gait,Decreased activity tolerance,Decreased strength,Impaired flexibility,Pain,Decreased range of motion,Increased muscle spasms,Decreased endurance,Postural dysfunction  Visit Diagnosis: Chronic right-sided low back pain with right-sided sciatica  Cramp and spasm  Muscle weakness (generalized)  Pain in right hip     Problem List Patient Active Problem List   Diagnosis Date Noted  . Routine general medical examination at a health care facility 05/27/2018  . Essential (primary) hypertension 05/20/2017  . Syncope 02/09/2017  . DDD (degenerative disc disease), lumbar 01/16/2011  . RESTLESS LEG SYNDROME 06/07/2009  . Hyperglycemia 06/07/2009  . Hyperlipidemia 07/19/2007    Lorrene Reid, PT 10/02/20 8:44 AM  [facility] [name], [address] [place], [place], [address] Phone: [phone]   Fax:  [phone]  Name: Andrea Kelley MRN: [id number] Date of Birth: Sep 06, 1940

## 2020-10-08 ENCOUNTER — Other Ambulatory Visit: Payer: Self-pay

## 2020-10-08 ENCOUNTER — Ambulatory Visit: Payer: Medicare Other

## 2020-10-08 DIAGNOSIS — R29898 Other symptoms and signs involving the musculoskeletal system: Secondary | ICD-10-CM | POA: Diagnosis not present

## 2020-10-08 DIAGNOSIS — M25551 Pain in right hip: Secondary | ICD-10-CM

## 2020-10-08 DIAGNOSIS — R252 Cramp and spasm: Secondary | ICD-10-CM | POA: Diagnosis not present

## 2020-10-08 DIAGNOSIS — M6281 Muscle weakness (generalized): Secondary | ICD-10-CM | POA: Diagnosis not present

## 2020-10-08 DIAGNOSIS — M5441 Lumbago with sciatica, right side: Secondary | ICD-10-CM

## 2020-10-08 DIAGNOSIS — G8929 Other chronic pain: Secondary | ICD-10-CM | POA: Diagnosis not present

## 2020-10-08 NOTE — Therapy (Signed)
[facility] [address], [place], [place], [address] Phone: [phone]   Fax:  [phone]  Physical Therapy Treatment  Patient Details  Name: Andrea Kelley MRN: [id number] Date of Birth: [birth year] Referring Provider (PT): Eunice Blase, MD   Encounter Date: 10/08/2020   PT End of Session - 10/08/20 1052    Visit Number 5    Date for PT Re-Evaluation 11/05/20    Authorization Type BCBS Medicare    PT Start Time 1017    PT Stop Time 1048    PT Time Calculation (min) 31 min    Activity Tolerance Patient tolerated treatment well    Behavior During Therapy [facility] for tasks assessed/performed           Past Medical History:  Diagnosis Date  . DDD (degenerative disc disease), lumbar   . Hyperlipidemia   . Osteopenia    [place]  . Syncope     Past Surgical History:  Procedure Laterality Date  . ABDOMINAL HYSTERECTOMY     For dysfunctional bleeding; no BSO  . BACK SURGERY  1970   ruptured disc ; Dr Quentin Cornwall  . COLONOSCOPY  2001   Negative; done for rectal bleeding  . LOOP RECORDER INSERTION N/A 02/16/2017   Procedure: Loop Recorder Insertion;  Surgeon: Sanda Klein, MD;  Location: [facility];  Service: Cardiovascular;  Laterality: N/A;    There were no vitals filed for this visit.   Subjective Assessment - 10/08/20 1020    Subjective I feel 100% better since the start of care. I haven't returned to yardwork yet and I want to try doing that.    Currently in Pain? No/denies              [facility] PT Assessment - 10/08/20 0001      Assessment   Medical Diagnosis Pain in Rt hip, chronic Rt sided low back pain with Rt sided sciatica    Referring Provider (PT) Eunice Blase, MD    Onset Date/Surgical Date 08/13/20      Cognition   Overall Cognitive Status Within Functional Limits for tasks assessed      Observation/Other Assessments   Focus on Therapeutic Outcomes (FOTO)  75- goal met       Strength   Overall Strength Comments Rt hip abduction 4/5.  All other hip strength 4+/5                         [facility] Adult PT Treatment/Exercise - 10/08/20 0001      Knee/Hip Exercises: Stretches   Piriformis Stretch Right;2 reps;30 seconds    Piriformis Stretch Limitations cross body right knee bent      Knee/Hip Exercises: Sidelying   Clams 2x10 bil with bracing- added red band                  PT Education - 10/08/20 1051    Education Details verbal review of HEP with pt with visuals, how to progress yardwork    Person(s) Educated Patient    Methods Explanation;Handout    Comprehension Verbalized understanding            PT Short Term Goals - 09/25/20 0733      PT SHORT TERM GOAL #1   Title independent with initial HEP    Status Achieved      PT SHORT TERM GOAL #2   Title report a 30% reduction in Rt LE pain  with standing and walking    Baseline 90-95% better this week- haven't been doing a lot lately    Period Weeks    Status On-going      PT SHORT TERM GOAL #3   Title initiate a walking program with her dog for short distances and advance as able    Baseline has tried a couple of times    Time 4    Status On-going             PT Long Term Goals - 10/08/20 1024      PT LONG TERM GOAL #1   Title independent with HEP    Status On-going      PT LONG TERM GOAL #2   Title report ability to walk dog for at least 3/4 mile without increase in pain for improved function    Baseline no limitations with walking    Status Achieved      PT LONG TERM GOAL #3   Title improve FOTO to > or = to 63 to improve function    Status Achieved      PT LONG TERM GOAL #4   Title report a 60% reduction in Rt LE radiculopathy with stading and walking to improve tolerance for this    Status Achieved      PT LONG TERM GOAL #5   Title report reduced Rt LE pain to allow 50% greater ease with falling asleep at night    Status On-going      PT  LONG TERM GOAL #6   Title demonstrate 4+/5 Rt hip abduction to improve endurance and pelvic alignment with walking    Status On-going                 Plan - 10/08/20 1054    Clinical Impression Statement Pt reports 95-100% overall improvement in symptoms since the start of care.  Pt is now able to walk without limitation and FOTO score is improved to 75- goals met. Pt has not yet returned to yardwork and would like to try to return to this and see if any increase in pain.  Pt will be placed on hold until appt on 10/24/20 and will return if needed.   Pt was performing all aspects of HEP for strength and flexibility correctly.  Pt will return to PT in 3 weeks as needed.    PT Frequency 2x / week    PT Duration 8 weeks    PT Treatment/Interventions ADLs/Self Care Home Management;Cryotherapy;Electrical Stimulation;Traction;Gait training;Stair training;Functional mobility training;Therapeutic activities;Therapeutic exercise;Balance training;Neuromuscular re-education;Patient/family education;Manual techniques;Dry needling;Joint Manipulations    PT Next Visit Plan Hold until 10/24/20- see how pt does with return to yardwork, finalize HEP, addrress any pain    PT Home Exercise Plan Access Code: [email or web address]    Consulted and Agree with Plan of Care Patient           Patient will benefit from skilled therapeutic intervention in order to improve the following deficits and impairments:  Abnormal gait,Decreased activity tolerance,Decreased strength,Impaired flexibility,Pain,Decreased range of motion,Increased muscle spasms,Decreased endurance,Postural dysfunction  Visit Diagnosis: Chronic right-sided low back pain with right-sided sciatica  Pain in right hip  Muscle weakness (generalized)     Problem List Patient Active Problem List   Diagnosis Date Noted  . Routine general medical examination at a health care facility 05/27/2018  . Essential (primary) hypertension 05/20/2017  . Syncope  02/09/2017  . DDD (degenerative disc disease), lumbar 01/16/2011  . RESTLESS LEG SYNDROME  06/07/2009  . Hyperglycemia 06/07/2009  . Hyperlipidemia 07/19/2007    Sigurd Sos, PT 10/08/20 10:56 AM  [facility] [address] [place], [place], [address] Phone: [phone]   Fax:  [phone]  Name: IDALYS KONECNY MRN: [id number] Date of Birth: [birth year]

## 2020-10-24 ENCOUNTER — Encounter (INDEPENDENT_AMBULATORY_CARE_PROVIDER_SITE_OTHER): Payer: Medicare Other | Admitting: Ophthalmology

## 2020-10-24 ENCOUNTER — Other Ambulatory Visit: Payer: Self-pay

## 2020-10-24 DIAGNOSIS — H353221 Exudative age-related macular degeneration, left eye, with active choroidal neovascularization: Secondary | ICD-10-CM

## 2020-10-24 DIAGNOSIS — H35033 Hypertensive retinopathy, bilateral: Secondary | ICD-10-CM | POA: Diagnosis not present

## 2020-10-24 DIAGNOSIS — H43813 Vitreous degeneration, bilateral: Secondary | ICD-10-CM

## 2020-10-24 DIAGNOSIS — H353112 Nonexudative age-related macular degeneration, right eye, intermediate dry stage: Secondary | ICD-10-CM

## 2020-10-24 DIAGNOSIS — I10 Essential (primary) hypertension: Secondary | ICD-10-CM

## 2020-11-01 ENCOUNTER — Ambulatory Visit: Payer: Medicare Other | Attending: Family Medicine

## 2020-11-01 ENCOUNTER — Other Ambulatory Visit: Payer: Self-pay

## 2020-11-01 DIAGNOSIS — M25551 Pain in right hip: Secondary | ICD-10-CM | POA: Insufficient documentation

## 2020-11-01 DIAGNOSIS — G8929 Other chronic pain: Secondary | ICD-10-CM | POA: Insufficient documentation

## 2020-11-01 DIAGNOSIS — M6281 Muscle weakness (generalized): Secondary | ICD-10-CM | POA: Diagnosis not present

## 2020-11-01 DIAGNOSIS — R252 Cramp and spasm: Secondary | ICD-10-CM | POA: Diagnosis not present

## 2020-11-01 DIAGNOSIS — M5441 Lumbago with sciatica, right side: Secondary | ICD-10-CM | POA: Insufficient documentation

## 2020-11-01 NOTE — Therapy (Signed)
[facility] [address] [place], [place], [address] Phone: [phone]   Fax:  [phone]  Physical Therapy Treatment  Patient Details  Name: Andrea Kelley MRN: [id number] Date of Birth: [birth year] Referring Provider (PT): Eunice Blase, MD   Encounter Date: 11/01/2020   PT End of Session - 11/01/20 1159    Visit Number 6    Date for PT Re-Evaluation 12/27/20    Authorization Type BCBS Medicare    PT Start Time 1147    PT Stop Time 1223    PT Time Calculation (min) 36 min    Activity Tolerance Patient tolerated treatment well    Behavior During Therapy [facility] for tasks assessed/performed           Past Medical History:  Diagnosis Date  . DDD (degenerative disc disease), lumbar   . Hyperlipidemia   . Osteopenia    [facility] [facility]  . Syncope     Past Surgical History:  Procedure Laterality Date  . ABDOMINAL HYSTERECTOMY     For dysfunctional bleeding; no BSO  . BACK SURGERY  1970   ruptured disc ; Dr Quentin Cornwall  . COLONOSCOPY  2001   Negative; done for rectal bleeding  . LOOP RECORDER INSERTION N/A 02/16/2017   Procedure: Loop Recorder Insertion;  Surgeon: Sanda Klein, MD;  Location: [facility];  Service: Cardiovascular;  Laterality: N/A;    There were no vitals filed for this visit.   Subjective Assessment - 11/01/20 1146    Subjective I was feeling good for 5 weeks and then I was walking across the floor on 10/24/20 and I started hurting again.  I have had pain since then in my Rt leg.  I wasn't doing my exercises as much when i was feeling good but I am back to doing them now.    Patient Stated Goals reduce Rt LE pain    Currently in Pain? 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  Pt is now having more pain so goal is not met.      Posture/Postural Control   Posture/Postural Control Postural limitations      Strength   Overall Strength Comments Rt hip abduction 4/5, extension is 3+/5 today, flexion 4+/5                         [facility] Adult PT Treatment/Exercise - 11/01/20 0001      Knee/Hip Exercises: Stretches   Active Hamstring Stretch Both;2  reps;20 seconds    Knee: Self-Stretch to increase Flexion Left;Right;2 reps;20 seconds    Piriformis Stretch Right;2 reps;30 seconds    Piriformis Stretch Limitations cross body right knee bent    Other Knee/Hip Stretches standing hip flexor stretch at steps 2x20 seconds bil                    PT Short Term Goals - 09/25/20 0733      PT SHORT TERM GOAL #1   Title independent with initial HEP    Status Achieved      PT SHORT TERM GOAL #2   Title report a 30% reduction in Rt LE pain with standing and walking    Baseline 90-95% better this week- haven't been doing a lot lately    Period Weeks    Status On-going      PT SHORT TERM GOAL #3   Title initiate a walking program with her dog for short distances and advance as able    Baseline has tried a couple of times    Time 4    Status On-going             PT Long Term Goals - 11/01/20 1151      PT LONG TERM GOAL #1   Title independent with HEP    Time 8    Period Weeks    Status  On-going    Target Date 12/27/20      PT LONG TERM GOAL #2   Title report ability to walk dog for at least 3/4 mile without increase in pain for improved function    Baseline hasn't been walking dog this week due to pain    Time 8    Status On-going    Target Date 12/27/20      PT LONG TERM GOAL #3   Title improve FOTO to > or = to 63 to improve function    Time 8    Period Weeks    Status On-going    Target Date 12/27/20      PT LONG TERM GOAL #4   Title report a 60% reduction in Rt LE radiculopathy with stading and walking to improve tolerance for this    Baseline 25% better overall, had relapse last week    Time 8    Period Weeks    Status On-going      PT LONG TERM GOAL #5   Title report reduced Rt LE pain to allow 50% greater ease with falling asleep at night    Time 8    Period Weeks    Status On-going    Target Date 12/27/20      PT LONG TERM GOAL #6   Title demonstrate 4+/5 Rt hip abduction to improve endurance and pelvic alignment with walking    Status On-going    Target Date 12/27/20                 Plan - 11/01/20 1226    Clinical Impression Statement Pt reported 95-100% overall improvement in symptoms when she was here last session and was placed on hold.  Pt was feeling good x 5 weeks and reports that 1 week ago, she was walking and the Rt LE pain started again.  Pt now reports intermittent Rt LE pain that is up to 8/10 with standing, walking and with sleep at night.  Pt has resumed exercises now that her pain has returned and is able to complete without  pain.  Pt is limited to walking 5-10 minutes in the community and is having difficulty with sleep at night.  Pt had reduced hip Rt extension stretch upon testing today and has been experiencing pain more in the anterior aspect of the Rt LE.  PT encouraged pt to follow-up with MD to discuss this change in status.   Pt was performing all aspects of HEP for strength and flexibility correctly.  Pt is going to  reach out to the MD for further assessment and will continue with PT to address Rt LE pain.    Rehab Potential Good    PT Frequency 2x / week    PT Duration 8 weeks    PT Treatment/Interventions ADLs/Self Care Home Management;Cryotherapy;Electrical Stimulation;Traction;Gait training;Stair training;Functional mobility training;Therapeutic activities;Therapeutic exercise;Balance training;Neuromuscular re-education;Patient/family education;Manual techniques;Dry needling;Joint Manipulations    PT Next Visit Plan continue treatment for Rt LE and stenosis.  Flexion based exercise, work on hip and core strength, DN to lumbar spine?    PT Home Exercise Plan Access Code: [email or web address]    Recommended Other Services recert sent 8/59/27    Consulted and Agree with Plan of Care Patient           Patient will benefit from skilled therapeutic intervention in order to improve the following deficits and impairments:  Abnormal gait,Decreased activity tolerance,Decreased strength,Impaired flexibility,Pain,Decreased range of motion,Increased muscle spasms,Decreased endurance,Postural dysfunction  Visit Diagnosis: Chronic right-sided low back pain with right-sided sciatica - Plan: PT plan of care cert/re-cert  Pain in right hip - Plan: PT plan of care cert/re-cert  Muscle weakness (generalized) - Plan: PT plan of care cert/re-cert  Cramp and spasm - Plan: PT plan of care cert/re-cert     Problem List Patient Active Problem List   Diagnosis Date Noted  . Routine general medical examination at a health care facility 05/27/2018  . Essential (primary) hypertension 05/20/2017  . Syncope 02/09/2017  . DDD (degenerative disc disease), lumbar 01/16/2011  . RESTLESS LEG SYNDROME 06/07/2009  . Hyperglycemia 06/07/2009  . Hyperlipidemia 07/19/2007     Sigurd Sos, PT 11/01/20 12:31 PM  [facility] [address], [place], [place],  [address] Phone: [phone]   Fax:  [phone]  Name: Andrea Kelley MRN: [id number] Date of Birth: 1941-02-29

## 2020-11-05 ENCOUNTER — Other Ambulatory Visit: Payer: Self-pay

## 2020-11-05 ENCOUNTER — Ambulatory Visit: Payer: Medicare Other | Admitting: Family Medicine

## 2020-11-05 ENCOUNTER — Encounter: Payer: Self-pay | Admitting: Family Medicine

## 2020-11-05 DIAGNOSIS — M25551 Pain in right hip: Secondary | ICD-10-CM

## 2020-11-05 MED ORDER — DICLOFENAC SODIUM 1 % EX GEL: 4.0000 g | Freq: Four times a day (QID) | CUTANEOUS | 6 refills | Status: DC | PRN

## 2020-11-05 NOTE — Progress Notes (Signed)
Office Visit Note   Patient: Andrea Kelley           Date of Birth: 04-19-1941           MRN: [id number] Visit Date: 11/05/2020 Requested by: [place], Betty G, MD [address] [place],  [place] [address] PCP: [place], Betty G, MD  Subjective: Chief Complaint  Patient presents with  . Right Hip - Pain    Pain got better with PT. It then returned 1&1/2 weeks ago -- she was just walking across the room. The pain is in the lateral hip -- occasionally radiates down the leg. Hurts to sit, hurts to lie down, and especially with walking.    HPI: She is here for follow-up right hip pain.  After last visit she went to physical therapy and after 3 weeks she was pain-free.  She did well even when she went on a trip, but then 1 day while walking on a flat surface she suddenly felt the recurrence of her lateral hip pain.  It is not as bad as before, but she is discouraged by it.  At the end of May she will be taking another big trip and wants to be able to do a lot of walking for that trip.               ROS:   All other systems were reviewed and are negative.  Objective: Vital Signs: There were no vitals taken for this visit.  Physical Exam:  General:  Alert and oriented, in no acute distress. Pulm:  Breathing unlabored. Psy:  Normal mood, congruent affect.  Right hip: She has pain when standing on her right leg, but the pain gets better with stork test.  She is point tender over the right greater trochanter.  No pain with internal hip rotation, lower extremity strength and reflexes are normal.  Imaging: No results found.  Assessment & Plan: 1.  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 -She will resume physical therapy.  Try Voltaren gel as needed.  If symptoms persist or worsen prior to her upcoming trip, we will try a cortisone injection.     Procedures: No procedures performed        PMFS  History: Patient Active Problem List   Diagnosis Date Noted  . Routine general medical examination at a health care facility 05/27/2018  . Essential (primary) hypertension 05/20/2017  . Syncope 02/09/2017  . DDD (degenerative disc disease), lumbar 01/16/2011  . RESTLESS LEG SYNDROME 06/07/2009  . Hyperglycemia 06/07/2009  . Hyperlipidemia 07/19/2007   Past Medical History:  Diagnosis Date  . DDD (degenerative disc disease), lumbar   . Hyperlipidemia   . Osteopenia    [place]  . Syncope     Family History  Problem Relation Age of Onset  . Lung cancer Father        smoker  . Rheum arthritis Father   . Heart attack Mother        24s  . Stroke Mother        in 68s  . Breast cancer Maternal Aunt   . Diabetes Neg Hx     Past Surgical History:  Procedure Laterality Date  . ABDOMINAL HYSTERECTOMY     For dysfunctional bleeding; no BSO  . BACK SURGERY  1970   ruptured disc ; Dr Roxan Hockey  . COLONOSCOPY  2001   Negative; done for rectal bleeding  . LOOP RECORDER  INSERTION N/A 02/16/2017   Procedure: Loop Recorder Insertion;  Surgeon: Thurmon Fair, MD;  [facility];  Service: Cardiovascular;  Laterality: N/A;   Social History   Occupational History  . Occupation: Retired Landscape architect, trained in Facilities manager therapy  Tobacco Use  . Smoking status: Never Smoker  . Smokeless tobacco: Never Used  Substance and Sexual Activity  . Alcohol use: Yes    Alcohol/week: 7.0 standard drinks    Types: 7 Glasses of wine per week  . Drug use: No  . Sexual activity: Not on file

## 2020-11-06 ENCOUNTER — Telehealth: Payer: Self-pay

## 2020-11-06 NOTE — Telephone Encounter (Signed)
We had already received these results through CoverMyMeds.

## 2020-11-06 NOTE — Telephone Encounter (Signed)
Brenda from [employer] called she stated diclofenac 1% gel was denied by patients insurance call back:[phone] option 5

## 2020-11-08 ENCOUNTER — Ambulatory Visit: Payer: Medicare Other | Admitting: Physical Therapy

## 2020-11-08 ENCOUNTER — Other Ambulatory Visit: Payer: Self-pay

## 2020-11-08 DIAGNOSIS — M6281 Muscle weakness (generalized): Secondary | ICD-10-CM

## 2020-11-08 DIAGNOSIS — M25551 Pain in right hip: Secondary | ICD-10-CM

## 2020-11-08 DIAGNOSIS — R252 Cramp and spasm: Secondary | ICD-10-CM | POA: Diagnosis not present

## 2020-11-08 DIAGNOSIS — M5441 Lumbago with sciatica, right side: Secondary | ICD-10-CM | POA: Diagnosis not present

## 2020-11-08 DIAGNOSIS — G8929 Other chronic pain: Secondary | ICD-10-CM | POA: Diagnosis not present

## 2020-11-08 NOTE — Patient Instructions (Signed)
[id number] Access Code: [email or web address] URL: [email or web address] Date: 11/08/2020 Prepared by: Lavinia Sharps  Exercises Supine Double Knee to Chest - 2 x daily - 7 x weekly - 1 sets - 3 reps - 20-30 hold Hooklying Single Knee to Chest Stretch - 2 x daily - 7 x weekly - 1 sets - 3 reps - 20-30 hold Supine Sciatic Nerve Glide - 3 x daily - 7 x weekly - 1 sets - 5 reps - 10 hold Seated Hamstring Stretch - 2 x daily - 7 x weekly - 1 sets - 3 reps - 20 hold Supine Piriformis Stretch with Leg Straight - 2 x daily - 7 x weekly - 3 reps - 1 sets - 30 sec hold Sidelying ITB Stretch off Table - 2 x daily - 7 x weekly - 1 sets - 3 reps - 60 seconds hold Sidelying Hip Abduction - 1 x daily - 7 x weekly - 3 sets - 10 reps Seated Slump Nerve Glide - 1 x daily - 7 x weekly - 1 sets - 10 reps

## 2020-11-08 NOTE — Therapy (Signed)
[facility] [address], [address] [place], [place], [address] Phone: [phone]   Fax:  [phone]  Physical Therapy Treatment  Patient Details  Name: Andrea Kelley MRN: [id number] Date of Birth: 01/25/1941 Referring Provider (PT): Lavada Mesi, MD   Encounter Date: 11/08/2020   PT End of Session - 11/08/20 1930    Visit Number 7    Date for PT Re-Evaluation 12/27/20    Authorization Type BCBS Medicare    PT Start Time 1100    PT Stop Time 1143    PT Time Calculation (min) 43 min    Activity Tolerance Patient tolerated treatment well           Past Medical History:  Diagnosis Date  . DDD (degenerative disc disease), lumbar   . Hyperlipidemia   . Osteopenia    [place] [facility]  . Syncope     Past Surgical History:  Procedure Laterality Date  . ABDOMINAL HYSTERECTOMY     For dysfunctional bleeding; no BSO  . BACK SURGERY  1970   ruptured disc ; Dr Roxan Hockey  . COLONOSCOPY  2001   Negative; done for rectal bleeding  . LOOP RECORDER INSERTION N/A 02/16/2017   Procedure: Loop Recorder Insertion;  Surgeon: Thurmon Fair, MD;  [facility]: [facility];  Service: Cardiovascular;  Laterality: N/A;    There were no vitals filed for this visit.   Subjective Assessment - 11/08/20 1105    Subjective Going to [place] end of May.  Dr. Prince Rome said if no better he would give me a cortisone shot.  Worse at night despite the position.  Sitting OK.  Walking would usually flare up.   Starts right lateral hip/buttock.  Symptoms to bottom of foot.    Pertinent History lumbar sugery 1970.    How long can you stand comfortably? this varies: intermittent limitation in standing tolerance    Diagnostic tests x-ray: L5-S1 stenosis    Patient Stated Goals reduce Rt LE pain    Currently in Pain? No/denies    Pain Score 0-No pain    Pain Location Buttocks    Pain Orientation Right    Pain Type Chronic pain                              [facility] Adult PT Treatment/Exercise - 11/08/20 0001      Lumbar Exercises: Seated   Other Seated Lumbar Exercises neural floss right and left 10x    Other Seated Lumbar Exercises review of all HEP to discuss response to each one      Moist Heat Therapy   Number Minutes Moist Heat 3 Minutes    Moist Heat Location Hip   initially tried supine but too painful; sidelying better     Manual Therapy   Manual therapy comments right piriformis contract relax    Joint Mobilization right inferior hip joint distraction; AP in internal rotation    Soft tissue mobilization right gluteals and piriformis            Trigger Point Dry Needling - 11/08/20 0001    Consent Given? Yes    Education Handout Provided Previously provided    Muscles Treated Back/Hip Gluteus medius;Piriformis    Dry Needling Comments right only; sidelying    Gluteus Medius Response Twitch response elicited;Palpable increased muscle length    Piriformis Response Twitch response elicited;Palpable increased muscle length  PT Education - 11/08/20 1930    Education Details neural glide seated    Person(s) Educated Patient    Methods Explanation;Demonstration;Handout    Comprehension Returned demonstration;Verbalized understanding            PT Short Term Goals - 09/25/20 0733      PT SHORT TERM GOAL #1   Title independent with initial HEP    Status Achieved      PT SHORT TERM GOAL #2   Title report a 30% reduction in Rt LE pain with standing and walking    Baseline 90-95% better this week- haven't been doing a lot lately    Period Weeks    Status On-going      PT SHORT TERM GOAL #3   Title initiate a walking program with her dog for short distances and advance as able    Baseline has tried a couple of times    Time 4    Status On-going             PT Long Term Goals - 11/01/20 1151      PT LONG TERM GOAL #1   Title independent with HEP    Time  8    Period Weeks    Status On-going    Target Date 12/27/20      PT LONG TERM GOAL #2   Title report ability to walk dog for at least 3/4 mile without increase in pain for improved function    Baseline hasn't been walking dog this week due to pain    Time 8    Status On-going    Target Date 12/27/20      PT LONG TERM GOAL #3   Title improve FOTO to > or = to 63 to improve function    Time 8    Period Weeks    Status On-going    Target Date 12/27/20      PT LONG TERM GOAL #4   Title report a 60% reduction in Rt LE radiculopathy with stading and walking to improve tolerance for this    Baseline 25% better overall, had relapse last week    Time 8    Period Weeks    Status On-going      PT LONG TERM GOAL #5   Title report reduced Rt LE pain to allow 50% greater ease with falling asleep at night    Time 8    Period Weeks    Status On-going    Target Date 12/27/20      PT LONG TERM GOAL #6   Title demonstrate 4+/5 Rt hip abduction to improve endurance and pelvic alignment with walking    Status On-going    Target Date 12/27/20                 Plan - 11/08/20 1132    Clinical Impression Statement The patient reports a recent exacerbation of symptoms which seems to start in lateral buttock region and radiates to her foot.  She reports it is generally worse at night and sometimes walking.  She states she did just come from [place] and this walking did not flare her up.  She hopes to be much better before an upcoming trip to [place].  She has had a positve response to DN in the past so initiated a trial of DN to her piriformis and gluteals.  Improved soft tissue and joint mobility following treatment session.  If no change or improvement by  next visit, may add DN to lumbar multifidi.  Therapist closely monitoring response with all treatment interventions.    Comorbidities lumbar surgery 1970 and lumbar stenosis L5/s1 now    Examination-Participation Restrictions Meal  Prep;Community Activity;Shop;Laundry    Stability/Clinical Decision Making Stable/Uncomplicated    Rehab Potential Good    PT Frequency 2x / week    PT Duration 8 weeks    PT Treatment/Interventions ADLs/Self Care Home Management;Cryotherapy;Electrical Stimulation;Traction;Gait training;Stair training;Functional mobility training;Therapeutic activities;Therapeutic exercise;Balance training;Neuromuscular re-education;Patient/family education;Manual techniques;Dry needling;Joint Manipulations    PT Next Visit Plan assess response to DN to right gluteals and piriformis;     Neural gliding;  continue treatment for Rt LE and stenosis.  Flexion based exercise, work on hip and core strength, DN to lumbar spine?    PT Home Exercise Plan Access Code: [email or web address]           Patient will benefit from skilled therapeutic intervention in order to improve the following deficits and impairments:  Abnormal gait,Decreased activity tolerance,Decreased strength,Impaired flexibility,Pain,Decreased range of motion,Increased muscle spasms,Decreased endurance,Postural dysfunction  Visit Diagnosis: Chronic right-sided low back pain with right-sided sciatica  Pain in right hip  Muscle weakness (generalized)  Cramp and spasm     Problem List Patient Active Problem List   Diagnosis Date Noted  . Routine general medical examination at a health care facility 05/27/2018  . Essential (primary) hypertension 05/20/2017  . Syncope 02/09/2017  . DDD (degenerative disc disease), lumbar 01/16/2011  . RESTLESS LEG SYNDROME 06/07/2009  . Hyperglycemia 06/07/2009  . Hyperlipidemia 07/19/2007   Lavinia Sharps, PT 11/08/20 7:38 PM Phone: [phone] Fax: [phone] Vivien Presto 11/08/2020, 7:37 PM  [facility] [address], [address] [place], [place], [address] Phone: [phone]   Fax:  [phone]  Name: Andrea Kelley MRN: [id number] Date of  Birth: 1940/10/14

## 2020-11-12 ENCOUNTER — Other Ambulatory Visit: Payer: Self-pay

## 2020-11-12 ENCOUNTER — Ambulatory Visit: Payer: Medicare Other | Attending: Family Medicine

## 2020-11-12 DIAGNOSIS — M5441 Lumbago with sciatica, right side: Secondary | ICD-10-CM | POA: Diagnosis not present

## 2020-11-12 DIAGNOSIS — R252 Cramp and spasm: Secondary | ICD-10-CM

## 2020-11-12 DIAGNOSIS — G8929 Other chronic pain: Secondary | ICD-10-CM | POA: Diagnosis not present

## 2020-11-12 DIAGNOSIS — M25551 Pain in right hip: Secondary | ICD-10-CM | POA: Insufficient documentation

## 2020-11-12 DIAGNOSIS — M6281 Muscle weakness (generalized): Secondary | ICD-10-CM

## 2020-11-12 NOTE — Therapy (Signed)
[facility] [address] [place], [place], [address] Phone: [phone]   Fax:  [phone]  Physical Therapy Treatment  Patient Details  Name: Andrea Kelley MRN: [id number] Date of Birth: 29-Aug-1940 Referring Provider (PT): Lavada Mesi, MD   Encounter Date: 11/12/2020   PT End of Session - 11/12/20 1218    Visit Number 8    Date for PT Re-Evaluation 12/27/20    Authorization Type BCBS Medicare    PT Start Time 1144    PT Stop Time 1229    PT Time Calculation (min) 45 min    Activity Tolerance Patient tolerated treatment well    Behavior During Therapy [facility] for tasks assessed/performed           Past Medical History:  Diagnosis Date  . DDD (degenerative disc disease), lumbar   . Hyperlipidemia   . Osteopenia    [facility]  . Syncope     Past Surgical History:  Procedure Laterality Date  . ABDOMINAL HYSTERECTOMY     For dysfunctional bleeding; no BSO  . BACK SURGERY  1970   ruptured disc ; Dr Roxan Hockey  . COLONOSCOPY  2001   Negative; done for rectal bleeding  . LOOP RECORDER INSERTION N/A 02/16/2017   Procedure: Loop Recorder Insertion;  Surgeon: Thurmon Fair, MD;  [facility]: [facility];  Service: Cardiovascular;  Laterality: N/A;    There were no vitals filed for this visit.   Subjective Assessment - 11/12/20 1143    Subjective I'm about the same.  I had the worst night ever on Friday after the needling on Thursday.  I made a run into [employer] and i am having pain into my Rt foot.    Currently in Pain? Yes    Pain Score 2     Pain Location Leg    Pain Orientation Right    Pain Descriptors / Indicators Aching    Pain Type Chronic pain    Pain Onset More than a month ago    Pain Frequency Intermittent    Aggravating Factors  turning over in bed, carrying items, standing/walking    Pain Relieving Factors sitting down                             [facility] Adult  PT Treatment/Exercise - 11/12/20 0001      Lumbar Exercises: Seated   Other Seated Lumbar Exercises neural floss right and left 10x      Knee/Hip Exercises: Stretches   Active Hamstring Stretch Both;2 reps;20 seconds    Active Hamstring Stretch Limitations 2 positions- seated and supine with strap    Piriformis Stretch Right;2 reps;30 seconds    Piriformis Stretch Limitations seated      Knee/Hip Exercises: Supine   Other Supine Knee/Hip Exercises ab bracing with hip abduction: yellow loop 2x10, ball squeeze 5" hold 2x10      Modalities   Modalities Electrical Stimulation;Moist Heat      Moist Heat Therapy   Number Minutes Moist Heat 15 Minutes    Moist Heat Location Lumbar Spine      Electrical Stimulation   Electrical Stimulation Location Lumbar spine and Rt gluteals and hamstring    Electrical Stimulation Action IFC    Electrical Stimulation Parameters 15 minutes    Electrical Stimulation Goals Pain  PT Short Term Goals - 11/12/20 1150      PT SHORT TERM GOAL #1   Title independent with initial HEP    Status Achieved      PT SHORT TERM GOAL #2   Title report a 30% reduction in Rt LE pain with standing and walking    Baseline flare-up    Status On-going      PT SHORT TERM GOAL #3   Title initiate a walking program with her dog for short distances and advance as able    Baseline haven't even tried    Status On-going      PT SHORT TERM GOAL #4   Title report a 25% improvement in abilty to fall asleep at night due to reduced Rt LE pain    Baseline difficult finding position    Status On-going             PT Long Term Goals - 11/01/20 1151      PT LONG TERM GOAL #1   Title independent with HEP    Time 8    Period Weeks    Status On-going    Target Date 12/27/20      PT LONG TERM GOAL #2   Title report ability to walk dog for at least 3/4 mile without increase in pain for improved function    Baseline hasn't been walking dog  this week due to pain    Time 8    Status On-going    Target Date 12/27/20      PT LONG TERM GOAL #3   Title improve FOTO to > or = to 63 to improve function    Time 8    Period Weeks    Status On-going    Target Date 12/27/20      PT LONG TERM GOAL #4   Title report a 60% reduction in Rt LE radiculopathy with stading and walking to improve tolerance for this    Baseline 25% better overall, had relapse last week    Time 8    Period Weeks    Status On-going      PT LONG TERM GOAL #5   Title report reduced Rt LE pain to allow 50% greater ease with falling asleep at night    Time 8    Period Weeks    Status On-going    Target Date 12/27/20      PT LONG TERM GOAL #6   Title demonstrate 4+/5 Rt hip abduction to improve endurance and pelvic alignment with walking    Status On-going    Target Date 12/27/20                 Plan - 11/12/20 1204    Clinical Impression Statement Pt had a bad night over the weekend where she was not able to find comfort to fall asleep at night.  Pt has been doing her new nerve glide exercise without change in symptoms.  Pt has not tried to walk her dog and experiences Rt leg aching into her foot with short distance ambulation in the grocery store.  Pt tolerates flexion based exercise and nerve glides without exacerbation of symptoms.  Trial of electrical stimulation today to determine impact on symptoms and PT issued information for Home TENs if this is helpful to manage pain.  Pt was monitored closely for technique and pain.    Rehab Potential Good    PT Frequency 2x / week    PT Duration  8 weeks    PT Treatment/Interventions ADLs/Self Care Home Management;Cryotherapy;Electrical Stimulation;Traction;Gait training;Stair training;Functional mobility training;Therapeutic activities;Therapeutic exercise;Balance training;Neuromuscular re-education;Patient/family education;Manual techniques;Dry needling;Joint Manipulations    PT Next Visit Plan DN to  gluteals and lumbar spine next, see how electrical stimulation helped pain. Lumbpelvic strength.    PT Home Exercise Plan Access Code: [email or web address]    Consulted and Agree with Plan of Care Patient           Patient will benefit from skilled therapeutic intervention in order to improve the following deficits and impairments:  Abnormal gait,Decreased activity tolerance,Decreased strength,Impaired flexibility,Pain,Decreased range of motion,Increased muscle spasms,Decreased endurance,Postural dysfunction  Visit Diagnosis: Chronic right-sided low back pain with right-sided sciatica  Pain in right hip  Muscle weakness (generalized)  Cramp and spasm     Problem List Patient Active Problem List   Diagnosis Date Noted  . Routine general medical examination at a health care facility 05/27/2018  . Essential (primary) hypertension 05/20/2017  . Syncope 02/09/2017  . DDD (degenerative disc disease), lumbar 01/16/2011  . RESTLESS LEG SYNDROME 06/07/2009  . Hyperglycemia 06/07/2009  . Hyperlipidemia 07/19/2007    Lorrene Reid, PT 11/12/20 12:19 PM  [facility] [address] [place], [place], [address] Phone: [phone]   Fax:  [phone]  Name: Andrea Kelley MRN: [id number] Date of Birth: 02/11/1941

## 2020-11-14 ENCOUNTER — Ambulatory Visit: Payer: Medicare Other

## 2020-11-14 ENCOUNTER — Other Ambulatory Visit: Payer: Self-pay

## 2020-11-14 DIAGNOSIS — M5441 Lumbago with sciatica, right side: Secondary | ICD-10-CM | POA: Diagnosis not present

## 2020-11-14 DIAGNOSIS — M6281 Muscle weakness (generalized): Secondary | ICD-10-CM

## 2020-11-14 DIAGNOSIS — G8929 Other chronic pain: Secondary | ICD-10-CM

## 2020-11-14 DIAGNOSIS — M25551 Pain in right hip: Secondary | ICD-10-CM

## 2020-11-14 DIAGNOSIS — R252 Cramp and spasm: Secondary | ICD-10-CM

## 2020-11-14 NOTE — Therapy (Signed)
[facility] [address], [address] [place], [place], [address] Phone: [phone]   Fax:  [phone]  Physical Therapy Treatment  Patient Details  Name: Andrea Kelley MRN: [id number] Date of Birth: [birth year] Referring Provider (PT): Lavada Mesi, MD   Encounter Date: 11/14/2020   PT End of Session - 11/14/20 0843    Visit Number 9    Date for PT Re-Evaluation 12/27/20    Authorization Type BCBS Medicare    PT Start Time 0758    PT Stop Time 0840    PT Time Calculation (min) 42 min    Activity Tolerance Patient tolerated treatment well    Behavior During Therapy [facility] for tasks assessed/performed           Past Medical History:  Diagnosis Date  . DDD (degenerative disc disease), lumbar   . Hyperlipidemia   . Osteopenia    Andrea Kelley  . Syncope     Past Surgical History:  Procedure Laterality Date  . ABDOMINAL HYSTERECTOMY     For dysfunctional bleeding; no BSO  . BACK SURGERY  1970   ruptured disc ; Dr Roxan Hockey  . COLONOSCOPY  2001   Negative; done for rectal bleeding  . LOOP RECORDER INSERTION N/A 02/16/2017   Procedure: Loop Recorder Insertion;  Surgeon: Thurmon Fair, MD;  [facility]: [facility];  Service: Cardiovascular;  Laterality: N/A;    There were no vitals filed for this visit.   Subjective Assessment - 11/14/20 0759    Subjective I didn't have any change in my symptoms after the electrical stimulation.    Currently in Pain? No/denies                             [facility] Adult PT Treatment/Exercise - 11/14/20 0001      Knee/Hip Exercises: Stretches   Active Hamstring Stretch Both;2 reps;20 seconds    Active Hamstring Stretch Limitations 2 positions- seated and supine with strap      Knee/Hip Exercises: Supine   Other Supine Knee/Hip Exercises ab bracing with hip abduction: yellow loop 2x10, ball squeeze 5" hold 2x10      Manual Therapy   Manual therapy comments  Rt gluteals and bil lumbar spine s/p DN.    Soft tissue mobilization monitoring, palpation and assessment during DN            Trigger Point Dry Needling - 11/14/20 0001    Consent Given? Yes    Education Handout Provided Previously provided    Muscles Treated Back/Hip Gluteus medius;Piriformis;Erector spinae    Dry Needling Comments Rt gluteals and paraspinals only    Gluteus Medius Response Twitch response elicited;Palpable increased muscle length    Piriformis Response Twitch response elicited;Palpable increased muscle length    Erector spinae Response Palpable increased muscle length;Twitch response elicited    Lumbar multifidi Response --                  PT Short Term Goals - 11/14/20 0800      PT SHORT TERM GOAL #2   Title report a 30% reduction in Rt LE pain with standing and walking    Status On-going             PT Long Term Goals - 11/01/20 1151      PT LONG TERM GOAL #1   Title independent with HEP    Time 8    Period Weeks  Status On-going    Target Date 12/27/20      PT LONG TERM GOAL #2   Title report ability to walk dog for at least 3/4 mile without increase in pain for improved function    Baseline hasn't been walking dog this week due to pain    Time 8    Status On-going    Target Date 12/27/20      PT LONG TERM GOAL #3   Title improve FOTO to > or = to 63 to improve function    Time 8    Period Weeks    Status On-going    Target Date 12/27/20      PT LONG TERM GOAL #4   Title report a 60% reduction in Rt LE radiculopathy with stading and walking to improve tolerance for this    Baseline 25% better overall, had relapse last week    Time 8    Period Weeks    Status On-going      PT LONG TERM GOAL #5   Title report reduced Rt LE pain to allow 50% greater ease with falling asleep at night    Time 8    Period Weeks    Status On-going    Target Date 12/27/20      PT LONG TERM GOAL #6   Title demonstrate 4+/5 Rt hip abduction  to improve endurance and pelvic alignment with walking    Status On-going    Target Date 12/27/20                 Plan - 11/14/20 0805    Clinical Impression Statement Pt denies any change in symptoms after electrical stimulation.  Pain reduction while machine was on and PT educated pt that she can use for pain management during her functional tasks.  Pt has not tried to walk her dog and experiences Rt leg aching into her foot with short distance ambulation in the grocery store.  Pt tolerates flexion based exercise and nerve glides without exacerbation of symptoms.  Pt with tension and trigger points in bil lumbar multifidi and Rt gluteals and demonstrated improved mobility after manual therapy today.   Pt will continue to benefit from skilled PT to address Rt LE pain and limited tolerance for functional tasks.    PT Frequency 2x / week    PT Duration 8 weeks    PT Treatment/Interventions ADLs/Self Care Home Management;Cryotherapy;Electrical Stimulation;Traction;Gait training;Stair training;Functional mobility training;Therapeutic activities;Therapeutic exercise;Balance training;Neuromuscular re-education;Patient/family education;Manual techniques;Dry needling;Joint Manipulations    PT Next Visit Plan assess response to DN and repeat if helpful, lumbopelvic strength    PT Home Exercise Plan Access Code: [email or web address]    Recommended Other Services recert is signed    Consulted and Agree with Plan of Care Patient           Patient will benefit from skilled therapeutic intervention in order to improve the following deficits and impairments:  Abnormal gait,Decreased activity tolerance,Decreased strength,Impaired flexibility,Pain,Decreased range of motion,Increased muscle spasms,Decreased endurance,Postural dysfunction  Visit Diagnosis: Chronic right-sided low back pain with right-sided sciatica  Pain in right hip  Muscle weakness (generalized)  Cramp and spasm     Problem  List Patient Active Problem List   Diagnosis Date Noted  . Routine general medical examination at a health care facility 05/27/2018  . Essential (primary) hypertension 05/20/2017  . Syncope 02/09/2017  . DDD (degenerative disc disease), lumbar 01/16/2011  . RESTLESS LEG SYNDROME 06/07/2009  . Hyperglycemia 06/07/2009  .  Hyperlipidemia 07/19/2007     Lorrene Reid, PT 11/14/20 8:44 AM  [facility] [address] [place], [place], [address] Phone: [phone]   Fax:  [phone]  Name: Andrea Kelley MRN: [id number] Date of Birth: September 19, 1940

## 2020-11-15 ENCOUNTER — Other Ambulatory Visit: Payer: Self-pay | Admitting: Family Medicine

## 2020-11-15 DIAGNOSIS — I10 Essential (primary) hypertension: Secondary | ICD-10-CM

## 2020-11-15 DIAGNOSIS — E782 Mixed hyperlipidemia: Secondary | ICD-10-CM

## 2020-11-20 ENCOUNTER — Ambulatory Visit: Payer: Medicare Other

## 2020-11-20 ENCOUNTER — Other Ambulatory Visit: Payer: Self-pay

## 2020-11-20 DIAGNOSIS — M5441 Lumbago with sciatica, right side: Secondary | ICD-10-CM | POA: Diagnosis not present

## 2020-11-20 DIAGNOSIS — M25551 Pain in right hip: Secondary | ICD-10-CM | POA: Diagnosis not present

## 2020-11-20 DIAGNOSIS — M6281 Muscle weakness (generalized): Secondary | ICD-10-CM

## 2020-11-20 DIAGNOSIS — G8929 Other chronic pain: Secondary | ICD-10-CM | POA: Diagnosis not present

## 2020-11-20 DIAGNOSIS — R252 Cramp and spasm: Secondary | ICD-10-CM | POA: Diagnosis not present

## 2020-11-20 NOTE — Patient Instructions (Signed)
Access Code: [email or web address] URL: [email or web address] Date: 11/20/2020 Prepared by: Tresa Endo   Sit to Stand Without Arm Support - 1 x daily - 7 x weekly - 2 sets - 10 reps Seated Hip Abduction with Resistance - 1 x daily - 7 x weekly - 2 sets - 10 reps

## 2020-11-20 NOTE — Therapy (Signed)
[facility] [address], [place], [place], [address] Phone: [phone]   Fax:  [phone]  Physical Therapy Treatment  Patient Details  Name: Andrea Kelley MRN: [id number] Date of Birth: 01-Aug-1941 Referring Provider (PT): Eunice Blase, MD   Encounter Date: 11/20/2020  Progress Note Reporting Period 09/10/20 to 11/20/20  See note below for Objective Data and Assessment of Progress/Goals.        PT End of Session - 11/20/20 0836    Visit Number 10    Date for PT Re-Evaluation 12/27/20    Authorization Type BCBS Medicare    Progress Note Due on Visit 20    PT Start Time 0802    PT Stop Time 0841    PT Time Calculation (min) 39 min    Activity Tolerance Patient tolerated treatment well    Behavior During Therapy [facility] for tasks assessed/performed           Past Medical History:  Diagnosis Date  . DDD (degenerative disc disease), lumbar   . Hyperlipidemia   . Osteopenia    [facility]  . Syncope     Past Surgical History:  Procedure Laterality Date  . ABDOMINAL HYSTERECTOMY     For dysfunctional bleeding; no BSO  . BACK SURGERY  1970   ruptured disc ; Dr Quentin Cornwall  . COLONOSCOPY  2001   Negative; done for rectal bleeding  . LOOP RECORDER INSERTION N/A 02/16/2017   Procedure: Loop Recorder Insertion;  Surgeon: Sanda Klein, MD;  Location: [facility];  Service: Cardiovascular;  Laterality: N/A;    There were no vitals filed for this visit.   Subjective Assessment - 11/20/20 0802    Subjective I am feeling a little bit better.  Needling didn't seem to make a big difference.  Pain is intermittent and it comes and goes.    Currently in Pain? Yes    Pain Score 3     Pain Location Leg    Pain Orientation Right    Pain Descriptors / Indicators Aching    Pain Type Chronic pain    Pain Onset More than a month ago    Pain Frequency Intermittent    Aggravating Factors   walking/standing,turning in bed, going up steps    Pain Relieving Factors sitting down              [facility] PT Assessment - 11/20/20 0001      Assessment   Medical Diagnosis Pain in Rt hip, chronic Rt sided low back pain with Rt sided sciatica    Referring Provider (PT) Eunice Blase, MD    Onset Date/Surgical Date 08/13/20      Cognition   Overall Cognitive Status Within Functional Limits for tasks assessed      Posture/Postural Control   Posture/Postural Control Postural limitations      Strength   Overall Strength Comments Rt hip abduction 4/5, extension is 3+/5 today, flexion 4+/5                         [facility] Adult PT Treatment/Exercise - 11/20/20 0001      Knee/Hip Exercises: Seated   Clamshell with TheraBand Yellow    Knee/Hip Flexion yellow loop    Sit to Sand 2 sets;10 reps;without UE support      Modalities   Modalities Traction      Traction   Type of Traction Lumbar    Min (lbs) 40  Max (lbs) 70    Hold Time 60    Rest Time 20    Time 12                  PT Education - 11/20/20 0816    Education Details Access Code: [email or web address]    Person(s) Educated Patient    Methods Explanation;Demonstration;Handout    Comprehension Verbalized understanding;Returned demonstration            PT Short Term Goals - 11/14/20 0800      PT SHORT TERM GOAL #2   Title report a 30% reduction in Rt LE pain with standing and walking    Status On-going             PT Long Term Goals - 11/20/20 0810      PT LONG TERM GOAL #1   Title independent with HEP    Status On-going      PT LONG TERM GOAL #2   Title report ability to walk dog for at least 3/4 mile without increase in pain for improved function    Baseline hasn't been walking dog this week due to pain    Time 8    Period Weeks    Status On-going      PT LONG TERM GOAL #3   Title improve FOTO to > or = to 63 to improve function    Baseline this was met 3 weeks ago.  Flare-up  now    Status On-going      PT LONG TERM GOAL #4   Title report a 60% reduction in Rt LE radiculopathy with stading and walking to improve tolerance for this    Baseline 25% better overall, had relapse last week    Status On-going      PT LONG TERM GOAL #5   Title report reduced Rt LE pain to allow 50% greater ease with falling asleep at night    Status On-going      PT LONG TERM GOAL #6   Title demonstrate 4+/5 Rt hip abduction to improve endurance and pelvic alignment with walking    Status On-going                 Plan - 11/20/20 0835    Clinical Impression Statement Pt continues to report intermittent Rt LE radiculopathy that is worse with standing, carrying and rolling in bed.  Pt rates pain as 3-5/10 and describes as aching.  PT added sit to stand and hip abduction to address lumbopelvic strength with function.  Trial of traction today to address stenosis and associated radiculopathy.  Pt with Rt LE and lumbar strength deficits and limited weightbearing tolerance due to radiculopathy and will continue to benefit from skilled PT to address this.    Rehab Potential Good    PT Frequency 2x / week    PT Duration 8 weeks    PT Treatment/Interventions ADLs/Self Care Home Management;Cryotherapy;Electrical Stimulation;Traction;Gait training;Stair training;Functional mobility training;Therapeutic activities;Therapeutic exercise;Balance training;Neuromuscular re-education;Patient/family education;Manual techniques;Dry needling;Joint Manipulations    PT Next Visit Plan assess response to traction and repeat if helpful, lumbopelvic strength-review new HEP    PT Home Exercise Plan Access Code: [email or web address]    Consulted and Agree with Plan of Care Patient           Patient will benefit from skilled therapeutic intervention in order to improve the following deficits and impairments:  Abnormal gait,Decreased activity tolerance,Decreased strength,Impaired flexibility,Pain,Decreased range  of motion,Increased muscle spasms,Decreased endurance,Postural dysfunction  Visit Diagnosis: Chronic right-sided low back pain with right-sided sciatica  Pain in right hip  Muscle weakness (generalized)  Cramp and spasm     Problem List Patient Active Problem List   Diagnosis Date Noted  . Routine general medical examination at a health care facility 05/27/2018  . Essential (primary) hypertension 05/20/2017  . Syncope 02/09/2017  . DDD (degenerative disc disease), lumbar 01/16/2011  . RESTLESS LEG SYNDROME 06/07/2009  . Hyperglycemia 06/07/2009  . Hyperlipidemia 07/19/2007     Sigurd Sos, PT 11/20/20 8:38 AM  [facility] [address], [place], [place], [address] Phone: [phone]   Fax:  [phone]  Name: Andrea Kelley MRN: [id number] Date of Birth: 1941-04-13

## 2020-12-03 ENCOUNTER — Other Ambulatory Visit: Payer: Self-pay

## 2020-12-03 ENCOUNTER — Ambulatory Visit: Payer: Medicare Other

## 2020-12-03 DIAGNOSIS — G8929 Other chronic pain: Secondary | ICD-10-CM

## 2020-12-03 DIAGNOSIS — M25551 Pain in right hip: Secondary | ICD-10-CM | POA: Diagnosis not present

## 2020-12-03 DIAGNOSIS — M6281 Muscle weakness (generalized): Secondary | ICD-10-CM | POA: Diagnosis not present

## 2020-12-03 DIAGNOSIS — R252 Cramp and spasm: Secondary | ICD-10-CM | POA: Diagnosis not present

## 2020-12-03 DIAGNOSIS — M5441 Lumbago with sciatica, right side: Secondary | ICD-10-CM | POA: Diagnosis not present

## 2020-12-03 NOTE — Therapy (Signed)
[facility] [address], [address] [place], [place], [address] Phone: [phone]   Fax:  [phone]  Physical Therapy Treatment  Patient Details  Name: Andrea Kelley MRN: [id number] Date of Birth: 10/11/1940 Referring Provider (PT): Hilts, Michael, MD   Encounter Date: 12/03/2020   PT End of Session - 12/03/20 1209    Visit Number 11    Date for PT Re-Evaluation 12/27/20    Authorization Type BCBS Medicare    Progress Note Due on Visit 20    PT Start Time 1146    PT Stop Time 1221    PT Time Calculation (min) 35 min    Activity Tolerance Patient tolerated treatment well    Behavior During Therapy WFL for tasks assessed/performed           Past Medical History:  Diagnosis Date  . DDD (degenerative disc disease), lumbar   . Hyperlipidemia   . Osteopenia    [facility]  . Syncope     Past Surgical History:  Procedure Laterality Date  . ABDOMINAL HYSTERECTOMY     For dysfunctional bleeding; no BSO  . BACK SURGERY  1970   ruptured disc ; Dr Robinson  . COLONOSCOPY  2001   Negative; done for rectal bleeding  . LOOP RECORDER INSERTION N/A 02/16/2017   Procedure: Loop Recorder Insertion;  Surgeon: Croitoru, Mihai, MD;  [facility];  Service: Cardiovascular;  Laterality: N/A;    There were no vitals filed for this visit.   Subjective Assessment - 12/03/20 1145    Subjective The traction was wonderful.  It helped by symptoms by 80%.  The pain has started to creep back up now.    Currently in Pain? No/denies   max 4/10 Rt LE pain with heavier housework                            [facility] Adult PT Treatment/Exercise - 12/03/20 0001      Knee/Hip Exercises: Seated   Clamshell with TheraBand Yellow    Knee/Hip Flexion yellow loop    Sit to Sand 2 sets;10 reps;without UE support      Modalities   Modalities Traction      Traction   Type of Traction Lumbar    Min (lbs) 40     Max (lbs) 80    Hold Time 60    Rest Time 20    Time 12                    PT Short Term Goals - 11/14/20 0800      PT SHORT TERM GOAL #2   Title report a 30% reduction in Rt LE pain with standing and walking    Status On-going             PT Long Term Goals - 11/20/20 0810      PT LONG TERM GOAL #1   Title independent with HEP    Status On-going      PT LONG TERM GOAL #2   Title report ability to walk dog for at least 3/4 mile without increase in pain for improved function    Baseline hasn't been walking dog this week due to pain    Time 8    Period Weeks    Status On-going      PT LONG TERM GOAL #3   Title improve FOTO to > or =   to 63 to improve function    Baseline this was met 3 weeks ago.  Flare-up now    Status On-going      PT LONG TERM GOAL #4   Title report a 60% reduction in Rt LE radiculopathy with stading and walking to improve tolerance for this    Baseline 25% better overall, had relapse last week    Status On-going      PT LONG TERM GOAL #5   Title report reduced Rt LE pain to allow 50% greater ease with falling asleep at night    Status On-going      PT LONG TERM GOAL #6   Title demonstrate 4+/5 Rt hip abduction to improve endurance and pelvic alignment with walking    Status On-going                 Plan - 12/03/20 1208    Clinical Impression Statement Pt reports significant relief of Rt LE symptoms, reporting 85% since traction last session.  Pt with max pain rated 4/10 since traction   PT added sit to stand and hip abduction to address lumbopelvic strength with function last session and there was review today.  Pt required minor verbal and demo cueing for each.  Traction was increased to 80# today and tolerated well.   Pt will continue to benefit from skilled PT to address Rt LE radiculopathy to improve function and mobility.    PT Frequency 2x / week    PT Duration 8 weeks    PT Treatment/Interventions ADLs/Self Care Home  Management;Cryotherapy;Electrical Stimulation;Traction;Gait training;Stair training;Functional mobility training;Therapeutic activities;Therapeutic exercise;Balance training;Neuromuscular re-education;Patient/family education;Manual techniques;Dry needling;Joint Manipulations    PT Next Visit Plan assess response to traction and repeat if helpful, lumbopelvic strength    PT Home Exercise Plan Access Code: [email or web address]    Consulted and Agree with Plan of Care Patient           Patient will benefit from skilled therapeutic intervention in order to improve the following deficits and impairments:  Abnormal gait,Decreased activity tolerance,Decreased strength,Impaired flexibility,Pain,Decreased range of motion,Increased muscle spasms,Decreased endurance,Postural dysfunction  Visit Diagnosis: Chronic right-sided low back pain with right-sided sciatica  Pain in right hip  Muscle weakness (generalized)  Cramp and spasm     Problem List Patient Active Problem List   Diagnosis Date Noted  . Routine general medical examination at a health care facility 05/27/2018  . Essential (primary) hypertension 05/20/2017  . Syncope 02/09/2017  . DDD (degenerative disc disease), lumbar 01/16/2011  . RESTLESS LEG SYNDROME 06/07/2009  . Hyperglycemia 06/07/2009  . Hyperlipidemia 07/19/2007     Sigurd Sos, PT 12/03/20 12:10 PM  [facility] [address] [place], [place], [address] Phone: [phone]   Fax:  [phone]  Name: Andrea Kelley MRN: [id number] Date of Birth: [birth year]

## 2020-12-05 ENCOUNTER — Other Ambulatory Visit: Payer: Self-pay

## 2020-12-05 ENCOUNTER — Ambulatory Visit: Payer: Medicare Other

## 2020-12-05 DIAGNOSIS — G8929 Other chronic pain: Secondary | ICD-10-CM

## 2020-12-05 DIAGNOSIS — R252 Cramp and spasm: Secondary | ICD-10-CM | POA: Diagnosis not present

## 2020-12-05 DIAGNOSIS — M5441 Lumbago with sciatica, right side: Secondary | ICD-10-CM | POA: Diagnosis not present

## 2020-12-05 DIAGNOSIS — M6281 Muscle weakness (generalized): Secondary | ICD-10-CM

## 2020-12-05 DIAGNOSIS — M25551 Pain in right hip: Secondary | ICD-10-CM | POA: Diagnosis not present

## 2020-12-05 NOTE — Therapy (Signed)
[facility] [address] [place], [place], [address] Phone: [phone]   Fax:  [phone]  Physical Therapy Treatment  Patient Details  Name: Andrea Kelley MRN: [id number] Date of Birth: 1941/04/01 Referring Provider (PT): Eunice Blase, MD   Encounter Date: 12/05/2020   PT End of Session - 12/05/20 1042    Visit Number 12    Date for PT Re-Evaluation 12/27/20    Authorization Type BCBS Medicare    Progress Note Due on Visit 20    PT Start Time 1016    PT Stop Time 1056    PT Time Calculation (min) 40 min    Activity Tolerance Patient tolerated treatment well    Behavior During Therapy [facility] for tasks assessed/performed           Past Medical History:  Diagnosis Date  . DDD (degenerative disc disease), lumbar   . Hyperlipidemia   . Osteopenia    [place] [facility] [facility]  . Syncope     Past Surgical History:  Procedure Laterality Date  . ABDOMINAL HYSTERECTOMY     For dysfunctional bleeding; no BSO  . BACK SURGERY  1970   ruptured disc ; Dr Quentin Cornwall  . COLONOSCOPY  2001   Negative; done for rectal bleeding  . LOOP RECORDER INSERTION N/A 02/16/2017   Procedure: Loop Recorder Insertion;  Surgeon: Sanda Klein, MD;  Location: [facility];  Service: Cardiovascular;  Laterality: N/A;    There were no vitals filed for this visit.   Subjective Assessment - 12/05/20 1020    Subjective I have just had pain when trying to sleep.  No pain with standing/walking.    Currently in Pain? No/denies    Pain Score --   Foot pain at night: Just uncomfortable and difficult getting comfortable                            [facility] Adult PT Treatment/Exercise - 12/05/20 0001      Knee/Hip Exercises: Stretches   Active Hamstring Stretch Both;2 reps;20 seconds    Active Hamstring Stretch Limitations supine with strap      Knee/Hip Exercises: Seated   Clamshell with TheraBand Yellow    Knee/Hip Flexion  yellow loop    Sit to Sand 2 sets;10 reps;without UE support   yellow loop around thighs     Knee/Hip Exercises: Sidelying   Clams 2x10 bil with bracing      Traction   Type of Traction Lumbar    Min (lbs) 40    Max (lbs) 80    Hold Time 60    Rest Time 20    Time 12                    PT Short Term Goals - 11/14/20 0800      PT SHORT TERM GOAL #2   Title report a 30% reduction in Rt LE pain with standing and walking    Status On-going             PT Long Term Goals - 11/20/20 0810      PT LONG TERM GOAL #1   Title independent with HEP    Status On-going      PT LONG TERM GOAL #2   Title report ability to walk dog for at least 3/4 mile without increase in pain for improved function    Baseline hasn't been walking dog this  week due to pain    Time 8    Period Weeks    Status On-going      PT LONG TERM GOAL #3   Title improve FOTO to > or = to 63 to improve function    Baseline this was met 3 weeks ago.  Flare-up now    Status On-going      PT LONG TERM GOAL #4   Title report a 60% reduction in Rt LE radiculopathy with stading and walking to improve tolerance for this    Baseline 25% better overall, had relapse last week    Status On-going      PT LONG TERM GOAL #5   Title report reduced Rt LE pain to allow 50% greater ease with falling asleep at night    Status On-going      PT LONG TERM GOAL #6   Title demonstrate 4+/5 Rt hip abduction to improve endurance and pelvic alignment with walking    Status On-going                 Plan - 12/05/20 1041    Clinical Impression Statement Pt reports significant relief of Rt LE symptoms, reporting 85% since beginning traction.  Pt reports no pain with standing but increased Rt foot ache at night when trying to go to sleep.   Pt required minor verbal and demo cueing exercises in clinic.   Pt will continue to benefit from skilled PT to address Rt LE radiculopathy to improve function and mobility.    PT  Frequency 2x / week    PT Duration 8 weeks    PT Treatment/Interventions ADLs/Self Care Home Management;Cryotherapy;Electrical Stimulation;Traction;Gait training;Stair training;Functional mobility training;Therapeutic activities;Therapeutic exercise;Balance training;Neuromuscular re-education;Patient/family education;Manual techniques;Dry needling;Joint Manipulations    PT Next Visit Plan See how pt does  on her trip. assess response to traction and repeat if helpful, lumbopelvic strength    PT Home Exercise Plan Access Code: [email or web address]    Consulted and Agree with Plan of Care Patient           Patient will benefit from skilled therapeutic intervention in order to improve the following deficits and impairments:  Abnormal gait,Decreased activity tolerance,Decreased strength,Impaired flexibility,Pain,Decreased range of motion,Increased muscle spasms,Decreased endurance,Postural dysfunction  Visit Diagnosis: Chronic right-sided low back pain with right-sided sciatica  Pain in right hip  Muscle weakness (generalized)  Cramp and spasm     Problem List Patient Active Problem List   Diagnosis Date Noted  . Routine general medical examination at a health care facility 05/27/2018  . Essential (primary) hypertension 05/20/2017  . Syncope 02/09/2017  . DDD (degenerative disc disease), lumbar 01/16/2011  . RESTLESS LEG SYNDROME 06/07/2009  . Hyperglycemia 06/07/2009  . Hyperlipidemia 07/19/2007     Sigurd Sos, PT 12/05/20 10:43 AM  [facility] [address] [place], [place], [address] Phone: [phone]   Fax:  [phone]  Name: Andrea Kelley MRN: [id number] Date of Birth: [birth year]

## 2020-12-13 ENCOUNTER — Ambulatory Visit: Payer: Medicare Other | Attending: Family Medicine

## 2020-12-13 ENCOUNTER — Other Ambulatory Visit: Payer: Self-pay

## 2020-12-13 DIAGNOSIS — M5441 Lumbago with sciatica, right side: Secondary | ICD-10-CM | POA: Insufficient documentation

## 2020-12-13 DIAGNOSIS — R252 Cramp and spasm: Secondary | ICD-10-CM | POA: Insufficient documentation

## 2020-12-13 DIAGNOSIS — G8929 Other chronic pain: Secondary | ICD-10-CM | POA: Diagnosis not present

## 2020-12-13 DIAGNOSIS — M6281 Muscle weakness (generalized): Secondary | ICD-10-CM

## 2020-12-13 DIAGNOSIS — M25551 Pain in right hip: Secondary | ICD-10-CM | POA: Diagnosis not present

## 2020-12-13 NOTE — Therapy (Signed)
[facility] [address] [place], [place], [address] Phone: [phone]   Fax:  [phone]  Physical Therapy Treatment  Patient Details  Name: Andrea Kelley MRN: [id number] Date of Birth: 09-29-1940 Referring Provider (PT): Eunice Blase, MD   Encounter Date: 12/13/2020   PT End of Session - 12/13/20 1042    Visit Number 13    Date for PT Re-Evaluation 12/27/20    Authorization Type BCBS Medicare    Progress Note Due on Visit 20    PT Start Time 1015    PT Stop Time 1050    PT Time Calculation (min) 35 min    Activity Tolerance Patient tolerated treatment well    Behavior During Therapy [facility] for tasks assessed/performed           Past Medical History:  Diagnosis Date  . DDD (degenerative disc disease), lumbar   . Hyperlipidemia   . Osteopenia    [place] [facility]  . Syncope     Past Surgical History:  Procedure Laterality Date  . ABDOMINAL HYSTERECTOMY     For dysfunctional bleeding; no BSO  . BACK SURGERY  1970   ruptured disc ; Dr Quentin Cornwall  . COLONOSCOPY  2001   Negative; done for rectal bleeding  . LOOP RECORDER INSERTION N/A 02/16/2017   Procedure: Loop Recorder Insertion;  Surgeon: Sanda Klein, MD;  Location: [facility];  Service: Cardiovascular;  Laterality: N/A;    There were no vitals filed for this visit.   Subjective Assessment - 12/13/20 1020    Subjective My trip was good and I have done nothing.  I am not having any pain.  I did some long periods of walking.  I don't go up and down the steps as easily because I am worried that I wont do well.    Currently in Pain? No/denies                             [facility] Adult PT Treatment/Exercise - 12/13/20 0001      Traction   Type of Traction Lumbar    Min (lbs) 40    Max (lbs) 85    Hold Time 60    Rest Time 20    Time 15                  PT Education - 12/13/20 1043    Education Details education  regarding body mechanics modifications, compliance with HEP and reduce time in standing to reduce spinal compression    Person(s) Educated Patient    Methods Explanation;Demonstration    Comprehension Verbalized understanding;Returned demonstration            PT Short Term Goals - 11/14/20 0800      PT SHORT TERM GOAL #2   Title report a 30% reduction in Rt LE pain with standing and walking    Status On-going             PT Long Term Goals - 11/20/20 0810      PT LONG TERM GOAL #1   Title independent with HEP    Status On-going      PT LONG TERM GOAL #2   Title report ability to walk dog for at least 3/4 mile without increase in pain for improved function    Baseline hasn't been walking dog this week due to pain    Time 8  Period Weeks    Status On-going      PT LONG TERM GOAL #3   Title improve FOTO to > or = to 63 to improve function    Baseline this was met 3 weeks ago.  Flare-up now    Status On-going      PT LONG TERM GOAL #4   Title report a 60% reduction in Rt LE radiculopathy with stading and walking to improve tolerance for this    Baseline 25% better overall, had relapse last week    Status On-going      PT LONG TERM GOAL #5   Title report reduced Rt LE pain to allow 50% greater ease with falling asleep at night    Status On-going      PT LONG TERM GOAL #6   Title demonstrate 4+/5 Rt hip abduction to improve endurance and pelvic alignment with walking    Status On-going                 Plan - 12/13/20 1046    Clinical Impression Statement Pt went on a trip and has not had any pain since last session.  Session was for traction only today as pt has a HEP in place and will continue to perform at home.  PT discussed importance of core activation, body mechanics modifications and avoidance of long periods of standing to help manage symptoms.  Pt will continue 2-3 more sessions for traction and advancement of HEP as needed.    PT Frequency 2x / week     PT Duration 8 weeks    PT Treatment/Interventions ADLs/Self Care Home Management;Cryotherapy;Electrical Stimulation;Traction;Gait training;Stair training;Functional mobility training;Therapeutic activities;Therapeutic exercise;Balance training;Neuromuscular re-education;Patient/family education;Manual techniques;Dry needling;Joint Manipulations    PT Next Visit Plan assess response to traction and repeat if helpful, lumbopelvic strength    PT Home Exercise Plan Access Code: [email or web address]    Consulted and Agree with Plan of Care Patient           Patient will benefit from skilled therapeutic intervention in order to improve the following deficits and impairments:  Abnormal gait,Decreased activity tolerance,Decreased strength,Impaired flexibility,Pain,Decreased range of motion,Increased muscle spasms,Decreased endurance,Postural dysfunction  Visit Diagnosis: Chronic right-sided low back pain with right-sided sciatica  Pain in right hip  Muscle weakness (generalized)  Cramp and spasm     Problem List Patient Active Problem List   Diagnosis Date Noted  . Routine general medical examination at a health care facility 05/27/2018  . Essential (primary) hypertension 05/20/2017  . Syncope 02/09/2017  . DDD (degenerative disc disease), lumbar 01/16/2011  . RESTLESS LEG SYNDROME 06/07/2009  . Hyperglycemia 06/07/2009  . Hyperlipidemia 07/19/2007     Sigurd Sos, PT 12/13/20 10:47 AM  [facility] [address] [place], [place], [address] Phone: [phone]   Fax:  [phone]  Name: Andrea Kelley MRN: [id number] Date of Birth: October 23, 1940

## 2020-12-15 ENCOUNTER — Other Ambulatory Visit: Payer: Self-pay | Admitting: Family Medicine

## 2020-12-15 DIAGNOSIS — G2581 Restless legs syndrome: Secondary | ICD-10-CM

## 2020-12-17 ENCOUNTER — Other Ambulatory Visit: Payer: Self-pay

## 2020-12-17 ENCOUNTER — Ambulatory Visit: Payer: Medicare Other

## 2020-12-17 DIAGNOSIS — R252 Cramp and spasm: Secondary | ICD-10-CM | POA: Diagnosis not present

## 2020-12-17 DIAGNOSIS — G8929 Other chronic pain: Secondary | ICD-10-CM | POA: Diagnosis not present

## 2020-12-17 DIAGNOSIS — M25551 Pain in right hip: Secondary | ICD-10-CM | POA: Diagnosis not present

## 2020-12-17 DIAGNOSIS — M5441 Lumbago with sciatica, right side: Secondary | ICD-10-CM

## 2020-12-17 DIAGNOSIS — M6281 Muscle weakness (generalized): Secondary | ICD-10-CM

## 2020-12-17 NOTE — Therapy (Signed)
[facility] [address] [place], [place], [address] Phone: [phone]   Fax:  [phone]  Physical Therapy Treatment  Patient Details  Name: Andrea Kelley MRN: [id number] Date of Birth: Jan 05, 1941 Referring Provider (PT): Lavada Mesi, MD   Encounter Date: 12/17/2020   PT End of Session - 12/17/20 1504    Visit Number 14    Date for PT Re-Evaluation 12/27/20    Authorization Type BCBS Medicare    Progress Note Due on Visit 20    PT Start Time 1446    PT Stop Time 1513    PT Time Calculation (min) 27 min    Activity Tolerance Patient tolerated treatment well    Behavior During Therapy [facility] for tasks assessed/performed           Past Medical History:  Diagnosis Date  . DDD (degenerative disc disease), lumbar   . Hyperlipidemia   . Osteopenia    [facility]  . Syncope     Past Surgical History:  Procedure Laterality Date  . ABDOMINAL HYSTERECTOMY     For dysfunctional bleeding; no BSO  . BACK SURGERY  1970   ruptured disc ; Dr Roxan Hockey  . COLONOSCOPY  2001   Negative; done for rectal bleeding  . LOOP RECORDER INSERTION N/A 02/16/2017   Procedure: Loop Recorder Insertion;  Surgeon: Thurmon Fair, MD;  [facility]: [facility];  Service: Cardiovascular;  Laterality: N/A;    There were no vitals filed for this visit.   Subjective Assessment - 12/17/20 1447    Subjective I am still feeling good.  I have avoided working in the yard though.    Currently in Pain? No/denies                             [facility] Adult PT Treatment/Exercise - 12/17/20 0001      Traction   Type of Traction Lumbar    Min (lbs) 40    Max (lbs) 85    Hold Time 60    Rest Time 20    Time 15                  PT Education - 12/17/20 1505    Education Details yardwork modifications- mechanics, short bouts, take breaks    Person(s) Educated Patient    Methods Explanation    Comprehension  Verbalized understanding            PT Short Term Goals - 11/14/20 0800      PT SHORT TERM GOAL #2   Title report a 30% reduction in Rt LE pain with standing and walking    Status On-going             PT Long Term Goals - 12/17/20 1449      PT LONG TERM GOAL #1   Title independent with HEP    Status On-going      PT LONG TERM GOAL #2   Title report ability to walk dog for at least 3/4 mile without increase in pain for improved function    Baseline has not been walking dog    Status On-going      PT LONG TERM GOAL #4   Title report a 60% reduction in Rt LE radiculopathy with stading and walking to improve tolerance for this    Baseline not having any leg pain at this time    Status On-going  PT LONG TERM GOAL #5   Title report reduced Rt LE pain to allow 50% greater ease with falling asleep at night    Status On-going                 Plan - 12/17/20 1503    Clinical Impression Statement Pt continues to report no leg pain at this time.  She has not been walking her dog or doing gardening as she is preparing for a trip and doesn't want to aggravate symptoms.  Pt will try gardening in short doses this week.  PT and pt discussed body mechanics modifications and need to take breaks often during this activity.  Session was for traction only today as pt has a HEP in place and will continue to perform at home.  PT discussed importance of core activation, body mechanics modifications and avoidance of long periods of standing to help manage symptoms.  Pt will continue 1-2 more sessions for traction and advancement of HEP as needed.    PT Frequency 2x / week    PT Duration 8 weeks    PT Treatment/Interventions ADLs/Self Care Home Management;Cryotherapy;Electrical Stimulation;Traction;Gait training;Stair training;Functional mobility training;Therapeutic activities;Therapeutic exercise;Balance training;Neuromuscular re-education;Patient/family education;Manual techniques;Dry  needling;Joint Manipulations    PT Next Visit Plan assess response to traction and repeat if helpful, lumbopelvic strength if nay modifcations are needed.    PT Home Exercise Plan Access Code: [email or web address]    Consulted and Agree with Plan of Care Patient           Patient will benefit from skilled therapeutic intervention in order to improve the following deficits and impairments:  Abnormal gait,Decreased activity tolerance,Decreased strength,Impaired flexibility,Pain,Decreased range of motion,Increased muscle spasms,Decreased endurance,Postural dysfunction  Visit Diagnosis: Chronic right-sided low back pain with right-sided sciatica  Pain in right hip  Muscle weakness (generalized)  Cramp and spasm     Problem List Patient Active Problem List   Diagnosis Date Noted  . Routine general medical examination at a health care facility 05/27/2018  . Essential (primary) hypertension 05/20/2017  . Syncope 02/09/2017  . DDD (degenerative disc disease), lumbar 01/16/2011  . RESTLESS LEG SYNDROME 06/07/2009  . Hyperglycemia 06/07/2009  . Hyperlipidemia 07/19/2007     Lorrene Reid, PT 12/17/20 3:06 PM  [facility] [address], [address] [place], [place], [address] Phone: [phone]   Fax:  [phone]  Name: Andrea Kelley MRN: [id number] Date of Birth: 09-03-1940

## 2020-12-20 ENCOUNTER — Ambulatory Visit: Payer: Medicare Other

## 2020-12-20 ENCOUNTER — Other Ambulatory Visit: Payer: Self-pay

## 2020-12-20 DIAGNOSIS — M5441 Lumbago with sciatica, right side: Secondary | ICD-10-CM

## 2020-12-20 DIAGNOSIS — M25551 Pain in right hip: Secondary | ICD-10-CM

## 2020-12-20 DIAGNOSIS — R252 Cramp and spasm: Secondary | ICD-10-CM | POA: Diagnosis not present

## 2020-12-20 DIAGNOSIS — G8929 Other chronic pain: Secondary | ICD-10-CM

## 2020-12-20 DIAGNOSIS — M6281 Muscle weakness (generalized): Secondary | ICD-10-CM | POA: Diagnosis not present

## 2020-12-20 NOTE — Therapy (Signed)
[facility] [address], [address] [place], [place], [address] Phone: [phone]   Fax:  [phone]  Physical Therapy Treatment  Patient Details  Name: Andrea Kelley MRN: [id number] Date of Birth: [birth year] Referring Provider (PT): Lavada Mesi, MD   Encounter Date: 12/20/2020   PT End of Session - 12/20/20 1117    Visit Number 15    Date for PT Re-Evaluation 12/27/20    Authorization Type BCBS Medicare    Progress Note Due on Visit 20    PT Start Time 1101    PT Stop Time 1122    PT Time Calculation (min) 21 min    Activity Tolerance Patient tolerated treatment well    Behavior During Therapy [facility] for tasks assessed/performed           Past Medical History:  Diagnosis Date  . DDD (degenerative disc disease), lumbar   . Hyperlipidemia   . Osteopenia    [place] [facility]  . Syncope     Past Surgical History:  Procedure Laterality Date  . ABDOMINAL HYSTERECTOMY     For dysfunctional bleeding; no BSO  . BACK SURGERY  1970   ruptured disc ; Dr Roxan Hockey  . COLONOSCOPY  2001   Negative; done for rectal bleeding  . LOOP RECORDER INSERTION N/A 02/16/2017   Procedure: Loop Recorder Insertion;  Surgeon: Thurmon Fair, MD;  Location: [facility];  Service: Cardiovascular;  Laterality: N/A;    There were no vitals filed for this visit.   Subjective Assessment - 12/20/20 1103    Subjective I am still doing very well.  Traction has really helped me.  I did short bouts of yard work without limitation.    Patient Stated Goals reduce Rt LE pain    Currently in Pain? No/denies                             [facility] Adult PT Treatment/Exercise - 12/20/20 0001      Traction   Type of Traction Lumbar    Min (lbs) 40    Max (lbs) 85    Hold Time 60    Rest Time 20    Time 15                    PT Short Term Goals - 11/14/20 0800      PT SHORT TERM GOAL #2   Title report a 30%  reduction in Rt LE pain with standing and walking    Status On-going             PT Long Term Goals - 12/17/20 1449      PT LONG TERM GOAL #1   Title independent with HEP    Status On-going      PT LONG TERM GOAL #2   Title report ability to walk dog for at least 3/4 mile without increase in pain for improved function    Baseline has not been walking dog    Status On-going      PT LONG TERM GOAL #4   Title report a 60% reduction in Rt LE radiculopathy with stading and walking to improve tolerance for this    Baseline not having any leg pain at this time    Status On-going      PT LONG TERM GOAL #5   Title report reduced Rt LE pain to allow 50% greater ease with falling  asleep at night    Status On-going                 Plan - 12/20/20 1115    Clinical Impression Statement Pt continues to report no leg pain at this time.  Pt has performed short bouts of gardening with rest breaks and didn't have any pain.  Pt will continue to make postural modifications with these tasks.   Session was for traction only today as pt has a HEP in place and will continue to perform at home.  PT discussed importance of core activation, body mechanics modifications and avoidance of long periods of standing to help manage symptoms.  Pt will continue 1 more sessions for traction and will D/C to HEP.    PT Frequency 2x / week    PT Duration 8 weeks    PT Treatment/Interventions ADLs/Self Care Home Management;Cryotherapy;Electrical Stimulation;Traction;Gait training;Stair training;Functional mobility training;Therapeutic activities;Therapeutic exercise;Balance training;Neuromuscular re-education;Patient/family education;Manual techniques;Dry needling;Joint Manipulations    PT Next Visit Plan 1 more session    PT Home Exercise Plan Access Code: [email or web address]    Consulted and Agree with Plan of Care Patient           Patient will benefit from skilled therapeutic intervention in order to improve  the following deficits and impairments:  Abnormal gait,Decreased activity tolerance,Decreased strength,Impaired flexibility,Pain,Decreased range of motion,Increased muscle spasms,Decreased endurance,Postural dysfunction  Visit Diagnosis: Chronic right-sided low back pain with right-sided sciatica  Pain in right hip  Muscle weakness (generalized)     Problem List Patient Active Problem List   Diagnosis Date Noted  . Routine general medical examination at a health care facility 05/27/2018  . Essential (primary) hypertension 05/20/2017  . Syncope 02/09/2017  . DDD (degenerative disc disease), lumbar 01/16/2011  . RESTLESS LEG SYNDROME 06/07/2009  . Hyperglycemia 06/07/2009  . Hyperlipidemia 07/19/2007     Lorrene Reid, PT 12/20/20 11:18 AM  [facility] [address], [address] [place], [place], [address] Phone: [phone]   Fax:  [phone]  Name: Andrea Kelley MRN: [id number] Date of Birth: 02-Jun-1941

## 2020-12-24 ENCOUNTER — Other Ambulatory Visit: Payer: Self-pay

## 2020-12-24 ENCOUNTER — Ambulatory Visit: Payer: Medicare Other

## 2020-12-24 DIAGNOSIS — M25551 Pain in right hip: Secondary | ICD-10-CM | POA: Diagnosis not present

## 2020-12-24 DIAGNOSIS — G8929 Other chronic pain: Secondary | ICD-10-CM

## 2020-12-24 DIAGNOSIS — M5441 Lumbago with sciatica, right side: Secondary | ICD-10-CM | POA: Diagnosis not present

## 2020-12-24 DIAGNOSIS — R252 Cramp and spasm: Secondary | ICD-10-CM | POA: Diagnosis not present

## 2020-12-24 DIAGNOSIS — M6281 Muscle weakness (generalized): Secondary | ICD-10-CM

## 2020-12-24 NOTE — Therapy (Signed)
[facility] [address] [place], [place], [address] Phone: [phone]   Fax:  [phone]  Physical Therapy Treatment  Patient Details  Name: Andrea Kelley MRN: [id number] Date of Birth: September 13, 1940 Referring Provider (PT): Eunice Blase, MD   Encounter Date: 12/24/2020   PT End of Session - 12/24/20 1037    Visit Number 16    PT Start Time 1287    PT Stop Time 8676    PT Time Calculation (min) 32 min    Activity Tolerance Patient tolerated treatment well    Behavior During Therapy [facility] for tasks assessed/performed           Past Medical History:  Diagnosis Date  . DDD (degenerative disc disease), lumbar   . Hyperlipidemia   . Osteopenia    [place] [facility]  . Syncope     Past Surgical History:  Procedure Laterality Date  . ABDOMINAL HYSTERECTOMY     For dysfunctional bleeding; no BSO  . BACK SURGERY  1970   ruptured disc ; Dr Quentin Cornwall  . COLONOSCOPY  2001   Negative; done for rectal bleeding  . LOOP RECORDER INSERTION N/A 02/16/2017   Procedure: Loop Recorder Insertion;  Surgeon: Sanda Klein, MD;  Location: [facility];  Service: Cardiovascular;  Laterality: N/A;    There were no vitals filed for this visit.   Subjective Assessment - 12/24/20 1020    Subjective I did a ton of yardwork and was fine.  Then, all of a sudden I had some pain in the Lt hamstring insertion.  I used a topical cream and took medication. No pain in Rt leg.    Currently in Pain? No/denies              [facility] PT Assessment - 12/24/20 0001      Assessment   Medical Diagnosis Pain in Rt hip, chronic Rt sided low back pain with Rt sided sciatica    Referring Provider (PT) Hilts, Michael, MD    Onset Date/Surgical Date 08/13/20      Observation/Other Assessments   Focus on Therapeutic Outcomes (FOTO)  14                         [facility] Adult PT Treatment/Exercise - 12/24/20 0001      Traction    Type of Traction Lumbar    Min (lbs) 40    Max (lbs) 85    Hold Time 60    Rest Time 20    Time 15                    PT Short Term Goals - 11/14/20 0800      PT SHORT TERM GOAL #2   Title report a 30% reduction in Rt LE pain with standing and walking    Status On-going             PT Long Term Goals - 12/24/20 1023      PT LONG TERM GOAL #1   Title independent with HEP    Status Achieved      PT LONG TERM GOAL #2   Title report ability to walk dog for at least 3/4 mile without increase in pain for improved function    Status Achieved      PT LONG TERM GOAL #3   Title improve FOTO to > or = to 63 to improve function    Baseline  75    Status Achieved      PT LONG TERM GOAL #4   Title report a 60% reduction in Rt LE radiculopathy with stading and walking to improve tolerance for this    Baseline not having any leg pain at this time    Status Achieved      PT LONG TERM GOAL #5   Title report reduced Rt LE pain to allow 50% greater ease with falling asleep at night    Status Achieved                 Plan - 12/24/20 1036    Clinical Impression Statement Pt is ready for D/C to HEP.  She remains compliant with HEP for flexibility and strength.  Pt denies Rt LE radiculopathy x 3-4 weeks.  Pt was able to do yardwork and had intermittent resultant Lt hamstring pain but no reproduction of her Rt LE radiculopathy.  Pt has met all goals and will D/C to HEP.  Pt will follow-up with MD as needed.    PT Next Visit Plan D/C PT to HEP    PT Home Exercise Plan Access Code: [email or web address]    Consulted and Agree with Plan of Care Patient           Patient will benefit from skilled therapeutic intervention in order to improve the following deficits and impairments:     Visit Diagnosis: Chronic right-sided low back pain with right-sided sciatica  Pain in right hip  Muscle weakness (generalized)     Problem List Patient Active Problem List   Diagnosis  Date Noted  . Routine general medical examination at a health care facility 05/27/2018  . Essential (primary) hypertension 05/20/2017  . Syncope 02/09/2017  . DDD (degenerative disc disease), lumbar 01/16/2011  . RESTLESS LEG SYNDROME 06/07/2009  . Hyperglycemia 06/07/2009  . Hyperlipidemia 07/19/2007   PHYSICAL THERAPY DISCHARGE SUMMARY  Visits from Start of Care: 16  Current functional level related to goals / functional outcomes: See above for current status.  Pt will D/C to HEP.   Remaining deficits: No functional deficits at this time.     Education / Equipment: HEP, intermittent rest breaks, body mechanics  Plan: Patient agrees to discharge.  Patient goals were met. Patient is being discharged due to meeting the stated rehab goals.  ?????         Sigurd Sos, PT 12/24/20 10:38 AM  [facility] [address] [place], [place], [address] Phone: [phone]   Fax:  [phone]  Name: Andrea Kelley MRN: [id number] Date of Birth: 14-Jun-1941

## 2021-04-17 DIAGNOSIS — H353132 Nonexudative age-related macular degeneration, bilateral, intermediate dry stage: Secondary | ICD-10-CM | POA: Diagnosis not present

## 2021-04-17 DIAGNOSIS — H43813 Vitreous degeneration, bilateral: Secondary | ICD-10-CM | POA: Diagnosis not present

## 2021-04-17 DIAGNOSIS — H11153 Pinguecula, bilateral: Secondary | ICD-10-CM | POA: Diagnosis not present

## 2021-04-17 DIAGNOSIS — Z961 Presence of intraocular lens: Secondary | ICD-10-CM | POA: Diagnosis not present

## 2021-04-26 ENCOUNTER — Other Ambulatory Visit: Payer: Self-pay

## 2021-04-26 ENCOUNTER — Encounter (INDEPENDENT_AMBULATORY_CARE_PROVIDER_SITE_OTHER): Payer: Medicare Other | Admitting: Ophthalmology

## 2021-04-26 DIAGNOSIS — H353112 Nonexudative age-related macular degeneration, right eye, intermediate dry stage: Secondary | ICD-10-CM | POA: Diagnosis not present

## 2021-04-26 DIAGNOSIS — H353221 Exudative age-related macular degeneration, left eye, with active choroidal neovascularization: Secondary | ICD-10-CM

## 2021-04-26 DIAGNOSIS — H43813 Vitreous degeneration, bilateral: Secondary | ICD-10-CM

## 2021-04-26 DIAGNOSIS — I10 Essential (primary) hypertension: Secondary | ICD-10-CM

## 2021-04-26 DIAGNOSIS — H35033 Hypertensive retinopathy, bilateral: Secondary | ICD-10-CM | POA: Diagnosis not present

## 2021-06-06 ENCOUNTER — Ambulatory Visit (INDEPENDENT_AMBULATORY_CARE_PROVIDER_SITE_OTHER): Payer: Medicare Other

## 2021-06-06 DIAGNOSIS — Z0000 Encounter for general adult medical examination without abnormal findings: Secondary | ICD-10-CM | POA: Diagnosis not present

## 2021-06-06 NOTE — Patient Instructions (Signed)
Andrea Kelley , Thank you for taking time to come for your Medicare Wellness Visit. I appreciate your ongoing commitment to your health goals. Please review the following plan we discussed and let me know if I can assist you in the future.   Screening recommendations/referrals: Colonoscopy: no longer required  Mammogram: no longer required  Bone Density: 05/27/2018 Recommended yearly ophthalmology/optometry visit for glaucoma screening and checkup Recommended yearly dental visit for hygiene and checkup  Vaccinations: Influenza vaccine: completed  Pneumococcal vaccine: completed  Tdap vaccine: 01/02/2015 Shingles vaccine: will consider     Advanced directives: will provide copies   Conditions/risks identified: none   Next appointment: none    Preventive Care 65 Years and Older, Female Preventive care refers to lifestyle choices and visits with your health care provider that can promote health and wellness. What does preventive care include? A yearly physical exam. This is also called an annual well check. Dental exams once or twice a year. Routine eye exams. Ask your health care provider how often you should have your eyes checked. Personal lifestyle choices, including: Daily care of your teeth and gums. Regular physical activity. Eating a healthy diet. Avoiding tobacco and drug use. Limiting alcohol use. Practicing safe sex. Taking low-dose aspirin every day. Taking vitamin and mineral supplements as recommended by your health care provider. What happens during an annual well check? The services and screenings done by your health care provider during your annual well check will depend on your age, overall health, lifestyle risk factors, and family history of disease. Counseling  Your health care provider may ask you questions about your: Alcohol use. Tobacco use. Drug use. Emotional well-being. Home and relationship well-being. Sexual activity. Eating habits. History  of falls. Memory and ability to understand (cognition). Work and work Astronomer. Reproductive health. Screening  You may have the following tests or measurements: Height, weight, and BMI. Blood pressure. Lipid and cholesterol levels. These may be checked every 5 years, or more frequently if you are over 24 years old. Skin check. Lung cancer screening. You may have this screening every year starting at age 68 if you have a 30-pack-year history of smoking and currently smoke or have quit within the past 15 years. Fecal occult blood test (FOBT) of the stool. You may have this test every year starting at age 39. Flexible sigmoidoscopy or colonoscopy. You may have a sigmoidoscopy every 5 years or a colonoscopy every 10 years starting at age 35. Hepatitis C blood test. Hepatitis B blood test. Sexually transmitted disease (STD) testing. Diabetes screening. This is done by checking your blood sugar (glucose) after you have not eaten for a while (fasting). You may have this done every 1-3 years. Bone density scan. This is done to screen for osteoporosis. You may have this done starting at age 37. Mammogram. This may be done every 1-2 years. Talk to your health care provider about how often you should have regular mammograms. Talk with your health care provider about your test results, treatment options, and if necessary, the need for more tests. Vaccines  Your health care provider may recommend certain vaccines, such as: Influenza vaccine. This is recommended every year. Tetanus, diphtheria, and acellular pertussis (Tdap, Td) vaccine. You may need a Td booster every 10 years. Zoster vaccine. You may need this after age 36. Pneumococcal 13-valent conjugate (PCV13) vaccine. One dose is recommended after age 54. Pneumococcal polysaccharide (PPSV23) vaccine. One dose is recommended after age 80. Talk to your health care provider about which  screenings and vaccines you need and how often you need  them. This information is not intended to replace advice given to you by your health care provider. Make sure you discuss any questions you have with your health care provider. Document Released: 08/24/2015 Document Revised: 04/16/2016 Document Reviewed: 05/29/2015 Elsevier Interactive Patient Education  2017 [place] Prevention in the Home Falls can cause injuries. They can happen to people of all ages. There are many things you can do to make your home safe and to help prevent falls. What can I do on the outside of my home? Regularly fix the edges of walkways and driveways and fix any cracks. Remove anything that might make you trip as you walk through a door, such as a raised step or threshold. Trim any bushes or trees on the path to your home. Use bright outdoor lighting. Clear any walking paths of anything that might make someone trip, such as rocks or tools. Regularly check to see if handrails are loose or broken. Make sure that both sides of any steps have handrails. Any raised decks and porches should have guardrails on the edges. Have any leaves, snow, or ice cleared regularly. Use sand or salt on walking paths during winter. Clean up any spills in your garage right away. This includes oil or grease spills. What can I do in the bathroom? Use night lights. Install grab bars by the toilet and in the tub and shower. Do not use towel bars as grab bars. Use non-skid mats or decals in the tub or shower. If you need to sit down in the shower, use a plastic, non-slip stool. Keep the floor dry. Clean up any water that spills on the floor as soon as it happens. Remove soap buildup in the tub or shower regularly. Attach bath mats securely with double-sided non-slip rug tape. Do not have throw rugs and other things on the floor that can make you trip. What can I do in the bedroom? Use night lights. Make sure that you have a light by your bed that is easy to reach. Do not use  any sheets or blankets that are too big for your bed. They should not hang down onto the floor. Have a firm chair that has side arms. You can use this for support while you get dressed. Do not have throw rugs and other things on the floor that can make you trip. What can I do in the kitchen? Clean up any spills right away. Avoid walking on wet floors. Keep items that you use a lot in easy-to-reach places. If you need to reach something above you, use a strong step stool that has a grab bar. Keep electrical cords out of the way. Do not use floor polish or wax that makes floors slippery. If you must use wax, use non-skid floor wax. Do not have throw rugs and other things on the floor that can make you trip. What can I do with my stairs? Do not leave any items on the stairs. Make sure that there are handrails on both sides of the stairs and use them. Fix handrails that are broken or loose. Make sure that handrails are as long as the stairways. Check any carpeting to make sure that it is firmly attached to the stairs. Fix any carpet that is loose or worn. Avoid having throw rugs at the top or bottom of the stairs. If you do have throw rugs, attach them to the floor with carpet  tape. Make sure that you have a light switch at the top of the stairs and the bottom of the stairs. If you do not have them, ask someone to add them for you. What else can I do to help prevent falls? Wear shoes that: Do not have high heels. Have rubber bottoms. Are comfortable and fit you well. Are closed at the toe. Do not wear sandals. If you use a stepladder: Make sure that it is fully opened. Do not climb a closed stepladder. Make sure that both sides of the stepladder are locked into place. Ask someone to hold it for you, if possible. Clearly mark and make sure that you can see: Any grab bars or handrails. First and last steps. Where the edge of each step is. Use tools that help you move around (mobility aids)  if they are needed. These include: Canes. Walkers. Scooters. Crutches. Turn on the lights when you go into a dark area. Replace any light bulbs as soon as they burn out. Set up your furniture so you have a clear path. Avoid moving your furniture around. If any of your floors are uneven, fix them. If there are any pets around you, be aware of where they are. Review your medicines with your doctor. Some medicines can make you feel dizzy. This can increase your chance of falling. Ask your doctor what other things that you can do to help prevent falls. This information is not intended to replace advice given to you by your health care provider. Make sure you discuss any questions you have with your health care provider. Document Released: 05/24/2009 Document Revised: 01/03/2016 Document Reviewed: 09/01/2014 Elsevier Interactive Patient Education  2017 [employer].

## 2021-06-06 NOTE — Progress Notes (Signed)
Subjective:   Andrea Kelley is a 80 y.o. female who presents for Medicare Annual (Subsequent) preventive examination.  I connected with Andrea Kelley today by telephone and verified that I am speaking with the correct person using two identifiers. Location patient: home Location provider: work Persons participating in the virtual visit: patient, provider.   I discussed the limitations, risks, security and privacy concerns of performing an evaluation and management service by telephone and the availability of in person appointments. I also discussed with the patient that there may be a patient responsible charge related to this service. The patient expressed understanding and verbally consented to this telephonic visit.    Interactive audio and video telecommunications were attempted between this provider and patient, however failed, due to patient having technical difficulties OR patient did not have access to video capability.  We continued and completed visit with audio only.    Review of Systems     Cardiac Risk Factors include: advanced age (>9men, >74 women);dyslipidemia     Objective:    Today's Vitals   There is no height or weight on file to calculate BMI.  Advanced Directives 06/06/2021 09/10/2020 03/27/2020 11/17/2019 06/23/2019 02/16/2017  Does Patient Have a Medical Advance Directive? Yes Yes Yes Yes Yes Yes  Type of Estate agent of Congress;Living will Healthcare Power of [place];Living will Healthcare Power of [place];Living will Healthcare Power of [place];Living will Healthcare Power of [place];Living will Healthcare Power of [place];Living will  Does patient want to make changes to medical advance directive? - No - Patient declined No - Patient declined No - Patient declined - No - Patient declined  Copy of Healthcare Power of Attorney in Chart? No - copy requested No - copy requested No - copy requested No - copy requested No - copy  requested No - copy requested    Current Medications (verified) Outpatient Encounter Medications as of 06/06/2021  Medication Sig   augmented betamethasone dipropionate (DIPROLENE-AF) 0.05 % cream APPLY TOPICALLY TO THE AFFECTED AREA TWICE DAILY   diclofenac Sodium (VOLTAREN) 1 % GEL Apply 4 g topically 4 (four) times daily as needed.   losartan (COZAAR) 50 MG tablet TAKE 1 TABLET(50 MG) BY MOUTH DAILY   pravastatin (PRAVACHOL) 20 MG tablet TAKE 1 TABLET(20 MG) BY MOUTH DAILY   rOPINIRole (REQUIP) 1 MG tablet TAKE 1 TABLET BY MOUTH AROUND 7:00PM WHEN SITTING WATCHING TV   ropinirole (REQUIP) 5 MG tablet TAKE 1 TABLET(5 MG) BY MOUTH AT BEDTIME   tiZANidine (ZANAFLEX) 2 MG tablet Take 1-2 tablets (2-4 mg total) by mouth every 6 (six) hours as needed for muscle spasms.   etodolac (LODINE) 400 MG tablet Take 1 tablet (400 mg total) by mouth 2 (two) times daily as needed.   Zoster Vaccine Adjuvanted [facility]) injection 0.5 ml in muscle and repeat in 8 weeks   No facility-administered encounter medications on file as of 06/06/2021.    Allergies (verified) Sulfonamide derivatives   History: Past Medical History:  Diagnosis Date   DDD (degenerative disc disease), lumbar    Hyperlipidemia    Osteopenia    Andrea Kelley   Syncope    Past Surgical History:  Procedure Laterality Date   ABDOMINAL HYSTERECTOMY     For dysfunctional bleeding; no BSO   BACK SURGERY  1970   ruptured disc ; Dr Roxan Hockey   COLONOSCOPY  2001   Negative; done for rectal bleeding   LOOP RECORDER INSERTION N/A 02/16/2017   Procedure: Loop Recorder Insertion;  Surgeon: Thurmon Fair, MD;  [facility];  Service: Cardiovascular;  Laterality: N/A;   Family History  Problem Relation Age of Onset   Lung cancer Father        smoker   Rheum arthritis Father    Heart attack Mother        36s   Stroke Mother        in 35s   Breast cancer Maternal Aunt    Diabetes Neg Hx    Social History    Socioeconomic History   Marital status: Widowed    Spouse name: Not on file   Number of children: Not on file   Years of education: Not on file   Highest education level: Not on file  Occupational History   Occupation: Retired Landscape architect, trained in speech therapy  Tobacco Use   Smoking status: Never   Smokeless tobacco: Never  Substance and Sexual Activity   Alcohol use: Yes    Alcohol/week: 7.0 standard drinks    Types: 7 Glasses of wine per week   Drug use: No   Sexual activity: Not on file  Other Topics Concern   Not on file  Social History Narrative   Lives w/ Husband in [place]   Social Determinants of Health   Financial Resource Strain: Low Risk    Difficulty of Paying Living Expenses: Not hard at all  Food Insecurity: No Food Insecurity   Worried About Programme researcher, broadcasting/film/video in the Last Year: Never true   Barista in the Last Year: Never true  Transportation Needs: No Transportation Needs   Lack of Transportation (Medical): No   Lack of Transportation (Non-Medical): No  Physical Activity: Inactive   Days of Exercise per Week: 0 days   Minutes of Exercise per Session: 0 min  Stress: No Stress Concern Present   Feeling of Stress : Not at all  Social Connections: Socially Isolated   Frequency of Communication with Friends and Family: Twice a week   Frequency of Social Gatherings with Friends and Family: Twice a week   Attends Religious Services: Never   Database administrator or Organizations: No   Attends Banker Meetings: Never   Marital Status: Widowed    Tobacco Counseling Counseling given: Not Answered   Clinical Intake:  Pre-visit preparation completed: Yes  Pain : No/denies pain     Nutritional Risks: None Diabetes: No  How often do you need to have someone help you when you read instructions, pamphlets, or other written materials from your doctor or pharmacy?: 1 - Never What is the last grade level you  completed in school?: Master  Diabetic?no  Interpreter Needed?: No  Information entered by :: L.Yena Tisby,LPn   Activities of Daily Living In your present state of health, do you have any difficulty performing the following activities: 06/06/2021  Hearing? N  Vision? N  Difficulty concentrating or making decisions? N  Walking or climbing stairs? N  Dressing or bathing? N  Doing errands, shopping? N  Preparing Food and eating ? N  Using the Toilet? N  In the past six months, have you accidently leaked urine? N  Do you have problems with loss of bowel control? N  Managing your Medications? N  Managing your Finances? N  Housekeeping or managing your Housekeeping? N  Some recent data might be hidden    Patient Care Team: [place], Betty G, MD as PCP - General (Family Medicine)  Indicate any  recent Medical Services you may have received from other than [facility] providers in the past year (date may be approximate).     Assessment:   This is a routine wellness examination for Andrea Kelley.  Hearing/Vision screen Vision Screening - Comments:: Annual eye exams wear glasses   Dietary issues and exercise activities discussed: Current Exercise Habits: The patient does not participate in regular exercise at present, Exercise limited by: orthopedic condition(s)   Goals Addressed             This Visit's Progress    LIFESTYLE - DECREASE FALLS RISK   On track      Depression Screen PHQ 2/9 Scores 06/06/2021 06/06/2021 06/05/2020 05/16/2019 09/10/2017 11/27/2015 05/12/2015  PHQ - 2 Score 0 0 0 0 0 0 0    Fall Risk Fall Risk  06/06/2021 09/10/2017 10/24/2013  Falls in the past year? 0 Yes No  Number falls in past yr: 0 1 -  Injury with Fall? 0 No -  Risk for fall due to : No Fall Risks - -  Follow up Falls evaluation completed - -    FALL RISK PREVENTION PERTAINING TO THE HOME:  Any stairs in or around the home? Yes  If so, are there any without handrails? No  Home free of loose throw  rugs in walkways, pet beds, electrical cords, etc? Yes  Adequate lighting in your home to reduce risk of falls? Yes   ASSISTIVE DEVICES UTILIZED TO PREVENT FALLS:  Life alert? No  Use of a cane, walker or w/c? Yes  Grab bars in the bathroom? Yes  Shower chair or bench in shower? Yes  Elevated toilet seat or a handicapped toilet? No     Cognitive Function:  Normal cognitive status assessed by direct observation by this Nurse Health Advisor. No abnormalities found.        Immunizations Immunization History  Administered Date(s) Administered   Fluad Quad(high Dose 65+) 05/16/2019, 05/10/2020   Influenza Split 06/16/2012   Influenza Whole 07/28/2008, 06/07/2009   Influenza, High Dose Seasonal PF 06/28/2014, 09/10/2017, 05/27/2018   Influenza,inj,Quad PF,6+ Mos 05/12/2015, 04/30/2016   Influenza-Unspecified 06/11/2013, 06/11/2014   PFIZER(Purple Top)SARS-COV-2 Vaccination 08/24/2019, 09/13/2019, 05/10/2020   Pneumococcal Conjugate-13 09/10/2017   Pneumococcal Polysaccharide-23 06/28/2014   Pneumococcal-Unspecified 10/25/2011   Tdap 01/02/2015   Zoster, Live 10/25/2010    TDAP status: Up to date  Flu Vaccine status: Up to date  Pneumococcal vaccine status: Up to date  Covid-19 vaccine status: Completed vaccines  Qualifies for Shingles Vaccine? Yes   Zostavax completed No   Shingrix Completed?: No.    Education has been provided regarding the importance of this vaccine. Patient has been advised to call insurance company to determine out of pocket expense if they have not yet received this vaccine. Advised may also receive vaccine at local pharmacy or Health Dept. Verbalized acceptance and understanding.  Screening Tests Health Maintenance  Topic Date Due   Zoster Vaccines- Shingrix (1 of 2) Never done   COVID-19 Vaccine (4 - Booster for Pfizer series) 07/05/2020   INFLUENZA VACCINE  03/11/2021   TETANUS/TDAP  01/01/2025   Pneumonia Vaccine 31+ Years old  Completed    DEXA SCAN  Completed   HPV VACCINES  Aged Out    Health Maintenance  Health Maintenance Due  Topic Date Due   Zoster Vaccines- Shingrix (1 of 2) Never done   COVID-19 Vaccine (4 - Booster for Pfizer series) 07/05/2020   INFLUENZA VACCINE  03/11/2021    Colorectal  cancer screening: No longer required.   Mammogram status: No longer required due to age.  Bone Density status: Completed 1017/2019. Results reflect: Bone density results: OSTEOPENIA. Repeat every 5 years.  Lung Cancer Screening: (Low Dose CT Chest recommended if Age 78-80 years, 30 pack-year currently smoking OR have quit w/in 15years.) does not qualify.   Lung Cancer Screening Referral: n/a  Additional Screening:  Hepatitis C Screening: does not qualify;   Vision Screening: Recommended annual ophthalmology exams for early detection of glaucoma and other disorders of the eye. Is the patient up to date with their annual eye exam?  Yes  Who is the provider or what is the name of the office in which the patient attends annual eye exams? Dr.Digby  If pt is not established with a provider, would they like to be referred to a provider to establish care? No .   Dental Screening: Recommended annual dental exams for proper oral hygiene  Community Resource Referral / Chronic Care Management: CRR required this visit?  No   CCM required this visit?  No      Plan:     I have personally reviewed and noted the following in the patient's chart:   Medical and social history Use of alcohol, tobacco or illicit drugs  Current medications and supplements including opioid prescriptions.  Functional ability and status Nutritional status Physical activity Advanced directives List of other physicians Hospitalizations, surgeries, and ER visits in previous 12 months Vitals Screenings to include cognitive, depression, and falls Referrals and appointments  In addition, I have reviewed and discussed with patient certain  preventive protocols, quality metrics, and best practice recommendations. A written personalized care plan for preventive services as well as general preventive health recommendations were provided to patient.     March Rummage, LPN   10/62/[id number]   Nurse Notes: none

## 2021-06-13 ENCOUNTER — Other Ambulatory Visit: Payer: Self-pay | Admitting: Family Medicine

## 2021-06-13 DIAGNOSIS — G2581 Restless legs syndrome: Secondary | ICD-10-CM

## 2021-08-12 ENCOUNTER — Other Ambulatory Visit: Payer: Self-pay | Admitting: Family Medicine

## 2021-08-12 DIAGNOSIS — E782 Mixed hyperlipidemia: Secondary | ICD-10-CM

## 2021-08-12 DIAGNOSIS — I10 Essential (primary) hypertension: Secondary | ICD-10-CM

## 2021-08-13 ENCOUNTER — Other Ambulatory Visit: Payer: Self-pay | Admitting: Family Medicine

## 2021-08-13 DIAGNOSIS — E782 Mixed hyperlipidemia: Secondary | ICD-10-CM

## 2021-08-13 DIAGNOSIS — I10 Essential (primary) hypertension: Secondary | ICD-10-CM

## 2021-08-31 DIAGNOSIS — M79605 Pain in left leg: Secondary | ICD-10-CM | POA: Diagnosis not present

## 2021-08-31 DIAGNOSIS — E7800 Pure hypercholesterolemia, unspecified: Secondary | ICD-10-CM | POA: Diagnosis not present

## 2021-08-31 DIAGNOSIS — W19XXXA Unspecified fall, initial encounter: Secondary | ICD-10-CM | POA: Diagnosis not present

## 2021-08-31 DIAGNOSIS — G2581 Restless legs syndrome: Secondary | ICD-10-CM | POA: Diagnosis not present

## 2021-08-31 DIAGNOSIS — S82842A Displaced bimalleolar fracture of left lower leg, initial encounter for closed fracture: Secondary | ICD-10-CM | POA: Diagnosis not present

## 2021-09-03 ENCOUNTER — Other Ambulatory Visit: Payer: Self-pay

## 2021-09-03 ENCOUNTER — Telehealth: Payer: Self-pay | Admitting: Physician Assistant

## 2021-09-03 ENCOUNTER — Ambulatory Visit: Payer: Self-pay

## 2021-09-03 ENCOUNTER — Encounter: Payer: Self-pay | Admitting: Physician Assistant

## 2021-09-03 ENCOUNTER — Ambulatory Visit: Payer: Medicare Other | Admitting: Physician Assistant

## 2021-09-03 DIAGNOSIS — M25572 Pain in left ankle and joints of left foot: Secondary | ICD-10-CM

## 2021-09-03 NOTE — Telephone Encounter (Signed)
Pt called wanting to know if it would be ok if she used a knee roller? Pt would like a CB with an answer please.   [phone]

## 2021-09-03 NOTE — Telephone Encounter (Signed)
Called and spoke with patient. Told her that [facility] said using a knee scooter is fine.

## 2021-09-03 NOTE — Progress Notes (Addendum)
Office Visit Note   Patient: Andrea Kelley           Date of Birth: 1941-02-23           MRN: [id number] Visit Date: 09/03/2021              Requested by: [place], Betty G, MD [address] [place],  [place] [address] PCP: [place], Betty G, MD  Chief Complaint  Patient presents with   Left Ankle - New Patient (Initial Visit)      HPI: Patient is a pleasant active 81 year old woman who is 4 days falling down a couple stairs.She tried applying ice to her ankle but had continued pain and went to the ER She was in [place] visiting with family.  She was seen and evaluated at the [facility] emergency room.  She was diagnosed with a bimalleolar ankle fracture which they thought could be treated conservatively.The ankle was not dislocated and did not require reduction other than placement in a splint  She was placed in a plaster splint and presents today.  She only has a remote history of a ankle sprain.  No recent problems with her ankle.  She is independent living and walks her dog  Assessment & Plan: Visit Diagnoses:  1. Pain in left ankle and joints of left foot     Plan: Bimalleolar ankle fracture reduced in reduced position.  She does have a fracture blister and significant soft tissue swelling.  She will be placed in a new splint with stirrup.  She has been instructed to elevate her leg to reduce the swelling and to remain non weightbearing She is only taking ibuprofen for pain she can continue this I also advocated for baby aspirin a day just because of inactivity and concerns for blood clots.  We also talked about vitamin D supplementation.  She will be nonweightbearing.    I did review the x-rays and the exam with Dr. Cleophas Dunker.  He was recommended that the patient return to the clinic on Thursday to get a slightly better mortise reduction of the fracture possible.  Follow-Up Instructions: No follow-ups on file.   Ortho Exam  Patient is alert, oriented, no  adenopathy, well-dressed, normal affect, normal respiratory effort. Examination of her left ankle she has moderate soft tissue swelling and ecchymosis.  She does have a small fixed fracture blister in place on the ankle.  There is no surrounding cellulitis erythema or drainage. The foot and toes are warm and dorsalis pulse is palpable.  Compartments are soft and compressible.  Overall otherwise skin is in good condition.  Tender over the medial and lateral side of her ankle.  Did not attempt a lot of motion secondary to the acuteness of the injury.New well padded splint was applied,CMS remained intact  Imaging: No results found. No images are attached to the encounter.  Labs: Lab Results  Component Value Date   HGBA1C 6.0 (H) 06/05/2020   HGBA1C 6.0 05/16/2019   HGBA1C 5.8 05/31/2018   LABURIC 7.0 08/17/2012     Lab Results  Component Value Date   ALBUMIN 4.5 05/16/2019   ALBUMIN 4.0 05/31/2018   ALBUMIN 4.2 01/26/2017    Lab Results  Component Value Date   MG 2.0 06/25/2006   Lab Results  Component Value Date   VD25OH 14.13 (L) 01/02/2015   VD25OH 33 01/16/2011   VD25OH 25 (L) 06/14/2009    No results found for: PREALBUMIN CBC EXTENDED Latest Ref Rng & Units  05/31/2018 01/26/2017 10/26/2013  WBC 4.0 - 10.5 K/uL 7.6 9.8 7.4  RBC 3.87 - 5.11 Mil/uL 4.35 4.61 4.39  HGB 12.0 - 15.0 g/dL 16.1 09.6 04.5  HCT 40.9 - 46.0 % 40.0 42.1 40.5  PLT 150.0 - 400.0 K/uL 267.0 340.0 344.0  NEUTROABS 1.4 - 7.7 K/uL - - 3.4  LYMPHSABS 0.7 - 4.0 K/uL - - 3.1     There is no height or weight on file to calculate BMI.  Orders:  Orders Placed This Encounter  Procedures   XR Ankle Complete Left   No orders of the defined types were placed in this encounter.    Procedures: No procedures performed  Clinical Data: No additional findings.  ROS:  All other systems negative, except as noted in the HPI. Review of Systems  Objective: Vital Signs: There were no vitals taken for  this visit.  Specialty Comments:  No specialty comments available.  PMFS History: Patient Active Problem List   Diagnosis Date Noted   Routine general medical examination at a health care facility 05/27/2018   Essential (primary) hypertension 05/20/2017   Syncope 02/09/2017   DDD (degenerative disc disease), lumbar 01/16/2011   RESTLESS LEG SYNDROME 06/07/2009   Hyperglycemia 06/07/2009   Hyperlipidemia 07/19/2007   Past Medical History:  Diagnosis Date   DDD (degenerative disc disease), lumbar    Hyperlipidemia    Osteopenia    [place] [facility]   Syncope     Family History  Problem Relation Age of Onset   Lung cancer Father        smoker   Rheum arthritis Father    Heart attack Mother        75s   Stroke Mother        in 24s   Breast cancer Maternal Aunt    Diabetes Neg Hx     Past Surgical History:  Procedure Laterality Date   ABDOMINAL HYSTERECTOMY     For dysfunctional bleeding; no BSO   BACK SURGERY  1970   ruptured disc ; Dr Roxan Hockey   COLONOSCOPY  2001   Negative; done for rectal bleeding   LOOP RECORDER INSERTION N/A 02/16/2017   Procedure: Loop Recorder Insertion;  Surgeon: Thurmon Fair, MD;  Location: [facility];  Service: Cardiovascular;  Laterality: N/A;   Social History   Occupational History   Occupation: Retired Landscape architect, trained in Facilities manager therapy  Tobacco Use   Smoking status: Never   Smokeless tobacco: Never  Substance and Sexual Activity   Alcohol use: Yes    Alcohol/week: 7.0 standard drinks    Types: 7 Glasses of wine per week   Drug use: No   Sexual activity: Not on file

## 2021-09-05 ENCOUNTER — Ambulatory Visit: Payer: Medicare Other | Admitting: Orthopaedic Surgery

## 2021-09-05 ENCOUNTER — Encounter: Payer: Self-pay | Admitting: Orthopaedic Surgery

## 2021-09-05 ENCOUNTER — Other Ambulatory Visit: Payer: Self-pay

## 2021-09-05 ENCOUNTER — Ambulatory Visit: Payer: Self-pay

## 2021-09-05 DIAGNOSIS — S82842A Displaced bimalleolar fracture of left lower leg, initial encounter for closed fracture: Secondary | ICD-10-CM | POA: Insufficient documentation

## 2021-09-05 DIAGNOSIS — M25572 Pain in left ankle and joints of left foot: Secondary | ICD-10-CM | POA: Diagnosis not present

## 2021-09-05 NOTE — Progress Notes (Signed)
Office Visit Note   Patient: Andrea Kelley           Date of Birth: 24-Jun-1941           MRN: [id number] Visit Date: 09/05/2021              Requested by: [place], Betty G, MD [address] [place],  [place] [address] PCP: [place], Betty G, MD   Assessment & Plan: Visit Diagnoses:  1. Pain in left ankle and joints of left foot   2. Closed bimalleolar fracture of left ankle, initial encounter     Plan: [name] is 6 days status post left ankle injury.  She was visiting family in [place] when she slipped and fell.  Clerance Lav saw her 2 days ago with evidence of a bimalleolar fracture.  I did review the films with her and thought that there was widening of the ankle mortise medially with some displacement of both malleoli.  I had her come back to the office today and performed a reduction maneuver on the ankle.  Post reduction films revealed excellent position.  She is in a posterior splint with stirrups and is comfortable.  There is a small blister along the medial malleolus which was well-padded.  Neurologically intact.  No calf pain.  We will have her return in about 12 days and repeat films.  She remains nonweightbearing and will continue to keep her leg elevated  Follow-Up Instructions: Return in about 2 weeks (around 09/19/2021).   Orders:  Orders Placed This Encounter  Procedures   XR Ankle 2 Views Left   No orders of the defined types were placed in this encounter.     Procedures: No procedures performed   Clinical Data: No additional findings.   Subjective: Chief Complaint  Patient presents with   Left Ankle - Injury    DOI 08/30/2021  Patient presents today for follow up on her left ankle. She fell and has a bimalleolar ankle fracture 6 days ago. She is currently in a splint. She hurts medially and laterally. She is taking over the over the counter medicine.   HPI  Review of Systems   Objective: Vital Signs: There were no vitals taken for  this visit.  Physical Exam Constitutional:      Appearance: She is well-developed.  Pulmonary:     Effort: Pulmonary effort is normal.  Skin:    General: Skin is warm and dry.  Neurological:     Mental Status: She is alert and oriented to person, place, and time.  Psychiatric:        Behavior: Behavior normal.    Ortho Exam left ankle out of the splint demonstrated no calf pain.  Neurologically intact.  There is a small fracture blister just above the medial malleolus without any redness.  Patient relates that the swelling has decreased.  Still having tenderness over the medial lateral malleolar line.  Skin otherwise intact  Specialty Comments:  No specialty comments available.  Imaging: XR Ankle 2 Views Left  Result Date: 09/05/2021 Post reduction ankle films were obtained demonstrating significant improvement in the bimalleolar fracture.  The lateral malleolus appears anatomic.  In near anatomic position of the medial malleolus.  Ankle mortise is significantly improved and appears to be almost normal.    PMFS History: Patient Active Problem List   Diagnosis Date Noted   Bimalleolar fracture of left ankle 09/05/2021   Routine general medical examination at a health care facility 05/27/2018  Essential (primary) hypertension 05/20/2017   Syncope 02/09/2017   DDD (degenerative disc disease), lumbar 01/16/2011   RESTLESS LEG SYNDROME 06/07/2009   Hyperglycemia 06/07/2009   Hyperlipidemia 07/19/2007   Past Medical History:  Diagnosis Date   DDD (degenerative disc disease), lumbar    Hyperlipidemia    Osteopenia    [place] [facility]   Syncope     Family History  Problem Relation Age of Onset   Lung cancer Father        smoker   Rheum arthritis Father    Heart attack Mother        58s   Stroke Mother        in 38s   Breast cancer Maternal Aunt    Diabetes Neg Hx     Past Surgical History:  Procedure Laterality Date   ABDOMINAL HYSTERECTOMY     For dysfunctional  bleeding; no BSO   BACK SURGERY  1970   ruptured disc ; Dr Roxan Hockey   COLONOSCOPY  2001   Negative; done for rectal bleeding   LOOP RECORDER INSERTION N/A 02/16/2017   Procedure: Loop Recorder Insertion;  Surgeon: Thurmon Fair, MD;  Location: [facility];  Service: Cardiovascular;  Laterality: N/A;   Social History   Occupational History   Occupation: Retired Landscape architect, trained in Facilities manager therapy  Tobacco Use   Smoking status: Never   Smokeless tobacco: Never  Substance and Sexual Activity   Alcohol use: Yes    Alcohol/week: 7.0 standard drinks    Types: 7 Glasses of wine per week   Drug use: No   Sexual activity: Not on file

## 2021-09-06 ENCOUNTER — Telehealth: Payer: Self-pay | Admitting: Physician Assistant

## 2021-09-06 NOTE — Telephone Encounter (Signed)
Called and spoke with patient. She is going to file that area. If that does not work, she will come back in.

## 2021-09-06 NOTE — Telephone Encounter (Signed)
Patient called advised the cast is sticking in her leg when she use the roller. Patient asked if she can file the cast down a little bit. Patient said the point on the cast is sticking into her leg when she bend it.    The number to contact patient is [phone]

## 2021-09-10 ENCOUNTER — Ambulatory Visit: Payer: Medicare Other | Admitting: Physician Assistant

## 2021-09-11 ENCOUNTER — Other Ambulatory Visit: Payer: Self-pay | Admitting: Family Medicine

## 2021-09-11 DIAGNOSIS — G2581 Restless legs syndrome: Secondary | ICD-10-CM

## 2021-09-17 ENCOUNTER — Ambulatory Visit: Payer: Medicare Other | Admitting: Orthopaedic Surgery

## 2021-09-17 ENCOUNTER — Other Ambulatory Visit: Payer: Self-pay

## 2021-09-17 ENCOUNTER — Ambulatory Visit (INDEPENDENT_AMBULATORY_CARE_PROVIDER_SITE_OTHER): Payer: Medicare Other

## 2021-09-17 ENCOUNTER — Encounter: Payer: Self-pay | Admitting: Orthopaedic Surgery

## 2021-09-17 DIAGNOSIS — S82842A Displaced bimalleolar fracture of left lower leg, initial encounter for closed fracture: Secondary | ICD-10-CM

## 2021-09-17 DIAGNOSIS — M25572 Pain in left ankle and joints of left foot: Secondary | ICD-10-CM

## 2021-09-17 NOTE — Progress Notes (Signed)
Office Visit Note   Patient: Andrea Kelley           Date of Birth: 1940-12-10           MRN: [id number] Visit Date: 09/17/2021              Requested by: [place], Betty G, MD [address] [place],  [place] [address] PCP: [place], Betty G, MD   Assessment & Plan: Visit Diagnoses:  1. Pain in left ankle and joints of left foot   2. Closed bimalleolar fracture of left ankle, initial encounter     Plan: Just over 2 weeks status post laterally displaced bimalleolar fracture left ankle.  I have reapplied a short leg cast with repeat positioning of the ankle with good position of the malleolar fractures and still a small increase space along the medial ankle mortise.  I think this will be fine over time.  Office 2 weeks  Follow-Up Instructions: Return in about 2 weeks (around 10/01/2021).   Orders:  Orders Placed This Encounter  Procedures   XR Ankle Complete Left   No orders of the defined types were placed in this encounter.     Procedures: No procedures performed   Clinical Data: No additional findings.   Subjective: Chief Complaint  Patient presents with   Left Ankle - Follow-up    Bimalleolar ankle fracture  Patient presents today for a two week follow up on her left ankle. She has a bimalleolar ankle fracture. She has been wearing a splint. Patient states that she is doing well.   HPI  Review of Systems   Objective: Vital Signs: There were no vitals taken for this visit.  Physical Exam  Ortho Exam left ankle was evaluated after removal of the splint.  It prior fracture blister is now flat and decompressed.  Skin is intact.  Very minimal swelling of the foot and ankle.  Neurologically intact.  Good capillary refill to toes.  Specialty Comments:  No specialty comments available.  Imaging: XR Ankle Complete Left  Result Date: 09/17/2021 Repeat films of the left ankle demonstrate good position of the lateral and medial malleolar fractures.  There is  about a 1 mm lateral drift of the talus with increased distance between the talus and the medial malleolus.  Not much change from prior films    PMFS History: Patient Active Problem List   Diagnosis Date Noted   Bimalleolar fracture of left ankle 09/05/2021   Routine general medical examination at a health care facility 05/27/2018   Essential (primary) hypertension 05/20/2017   Syncope 02/09/2017   DDD (degenerative disc disease), lumbar 01/16/2011   RESTLESS LEG SYNDROME 06/07/2009   Hyperglycemia 06/07/2009   Hyperlipidemia 07/19/2007   Past Medical History:  Diagnosis Date   DDD (degenerative disc disease), lumbar    Hyperlipidemia    Osteopenia    [place]   Syncope     Family History  Problem Relation Age of Onset   Lung cancer Father        smoker   Rheum arthritis Father    Heart attack Mother        24s   Stroke Mother        in 4s   Breast cancer Maternal Aunt    Diabetes Neg Hx     Past Surgical History:  Procedure Laterality Date   ABDOMINAL HYSTERECTOMY     For dysfunctional bleeding; no BSO   BACK SURGERY  1970   ruptured  disc ; Dr Roxan Hockey   COLONOSCOPY  2001   Negative; done for rectal bleeding   LOOP RECORDER INSERTION N/A 02/16/2017   Procedure: Loop Recorder Insertion;  Surgeon: Thurmon Fair, MD;  Location: [facility];  Service: Cardiovascular;  Laterality: N/A;   Social History   Occupational History   Occupation: Retired Landscape architect, trained in Facilities manager therapy  Tobacco Use   Smoking status: Never   Smokeless tobacco: Never  Substance and Sexual Activity   Alcohol use: Yes    Alcohol/week: 7.0 standard drinks    Types: 7 Glasses of wine per week   Drug use: No   Sexual activity: Not on file

## 2021-10-01 ENCOUNTER — Ambulatory Visit: Payer: Medicare Other | Admitting: Physician Assistant

## 2021-10-01 ENCOUNTER — Encounter: Payer: Self-pay | Admitting: Physician Assistant

## 2021-10-01 ENCOUNTER — Ambulatory Visit (INDEPENDENT_AMBULATORY_CARE_PROVIDER_SITE_OTHER): Payer: Medicare Other

## 2021-10-01 ENCOUNTER — Encounter: Payer: Self-pay | Admitting: Orthopaedic Surgery

## 2021-10-01 ENCOUNTER — Other Ambulatory Visit: Payer: Self-pay

## 2021-10-01 DIAGNOSIS — S82842A Displaced bimalleolar fracture of left lower leg, initial encounter for closed fracture: Secondary | ICD-10-CM

## 2021-10-01 NOTE — Progress Notes (Signed)
Office Visit Note   Patient: Andrea Kelley           Date of Birth: Dec 10, 1940           MRN: [id number] Visit Date: 10/01/2021              Requested by: [place], Betty G, MD [address] [place],  [place] [address] PCP: [place], Betty G, MD  Chief Complaint  Patient presents with   Left Ankle - Follow-up      HPI: Patient is a pleasant 81 year old woman who is now 1 month status post left bimalleolar ankle fracture.  She is doing well.  She has been in a nonweightbearing cast and has been compliant with her weightbearing restrictions.  She denies fever, chills, or calf pain  Assessment & Plan: Visit Diagnoses:  1. Closed bimalleolar fracture of left ankle, initial encounter     Plan: Patient will go into a cam boot today.  I would expect she would be nonweightbearing for 2 weeks.  I will discuss with Dr. Cleophas Dunker if she may begin gentle range of motion exercises such as alphabet exercises.  We will contact her this afternoon with his recommendations  Follow-Up Instructions: No follow-ups on file.   Ortho Exam  Patient is alert, oriented, no adenopathy, well-dressed, normal affect, normal respiratory effort. Examination of her ankle fracture blister has resolved nicely.  Her dorsalis pedis pedis pulses easily palpable toes are warm and pink.  She does have as would be expected of some swelling from immobilization in her foot.  No redness no cellulitis she still has some tenderness over the medial malleolus with palpation.  Compartments are soft and compressible has some expected ankle stiffness with passive range of motion  Imaging: XR Ankle Complete Left  Result Date: 10/01/2021 Radiographs of her left ankle were obtained.  She does have maintained reduction of the bimalleolar ankle fracture.  She does have some disuse osteopenia as would be expected.  Still has minimal bony bridging on the mortise view  No images are attached to the encounter.  Labs: Lab Results   Component Value Date   HGBA1C 6.0 (H) 06/05/2020   HGBA1C 6.0 05/16/2019   HGBA1C 5.8 05/31/2018   LABURIC 7.0 08/17/2012     Lab Results  Component Value Date   ALBUMIN 4.5 05/16/2019   ALBUMIN 4.0 05/31/2018   ALBUMIN 4.2 01/26/2017    Lab Results  Component Value Date   MG 2.0 06/25/2006   Lab Results  Component Value Date   VD25OH 14.13 (L) 01/02/2015   VD25OH 33 01/16/2011   VD25OH 25 (L) 06/14/2009    No results found for: PREALBUMIN CBC EXTENDED Latest Ref Rng & Units 05/31/2018 01/26/2017 10/26/2013  WBC 4.0 - 10.5 K/uL 7.6 9.8 7.4  RBC 3.87 - 5.11 Mil/uL 4.35 4.61 4.39  HGB 12.0 - 15.0 g/dL 81.8 29.9 37.1  HCT 69.6 - 46.0 % 40.0 42.1 40.5  PLT 150.0 - 400.0 K/uL 267.0 340.0 344.0  NEUTROABS 1.4 - 7.7 K/uL - - 3.4  LYMPHSABS 0.7 - 4.0 K/uL - - 3.1     There is no height or weight on file to calculate BMI.  Orders:  Orders Placed This Encounter  Procedures   XR Ankle Complete Left   No orders of the defined types were placed in this encounter.    Procedures: No procedures performed  Clinical Data: No additional findings.  ROS:  All other systems negative, except as noted in the  HPI. Review of Systems  Objective: Vital Signs: There were no vitals taken for this visit.  Specialty Comments:  No specialty comments available.  PMFS History: Patient Active Problem List   Diagnosis Date Noted   Bimalleolar fracture of left ankle 09/05/2021   Routine general medical examination at a health care facility 05/27/2018   Essential (primary) hypertension 05/20/2017   Syncope 02/09/2017   DDD (degenerative disc disease), lumbar 01/16/2011   RESTLESS LEG SYNDROME 06/07/2009   Hyperglycemia 06/07/2009   Hyperlipidemia 07/19/2007   Past Medical History:  Diagnosis Date   DDD (degenerative disc disease), lumbar    Hyperlipidemia    Osteopenia    [place]   Syncope     Family History  Problem Relation Age of Onset   Lung cancer Father         smoker   Rheum arthritis Father    Heart attack Mother        75s   Stroke Mother        in 83s   Breast cancer Maternal Aunt    Diabetes Neg Hx     Past Surgical History:  Procedure Laterality Date   ABDOMINAL HYSTERECTOMY     For dysfunctional bleeding; no BSO   BACK SURGERY  1970   ruptured disc ; Dr Roxan Hockey   COLONOSCOPY  2001   Negative; done for rectal bleeding   LOOP RECORDER INSERTION N/A 02/16/2017   Procedure: Loop Recorder Insertion;  Surgeon: Thurmon Fair, MD;  Location: [facility];  Service: Cardiovascular;  Laterality: N/A;   Social History   Occupational History   Occupation: Retired Landscape architect, trained in Facilities manager therapy  Tobacco Use   Smoking status: Never   Smokeless tobacco: Never  Substance and Sexual Activity   Alcohol use: Yes    Alcohol/week: 7.0 standard drinks    Types: 7 Glasses of wine per week   Drug use: No   Sexual activity: Not on file

## 2021-10-11 ENCOUNTER — Other Ambulatory Visit: Payer: Self-pay | Admitting: Family Medicine

## 2021-10-11 DIAGNOSIS — G2581 Restless legs syndrome: Secondary | ICD-10-CM

## 2021-10-16 ENCOUNTER — Other Ambulatory Visit: Payer: Self-pay | Admitting: Family Medicine

## 2021-10-16 ENCOUNTER — Telehealth (INDEPENDENT_AMBULATORY_CARE_PROVIDER_SITE_OTHER): Payer: Medicare Other | Admitting: Family Medicine

## 2021-10-16 ENCOUNTER — Encounter: Payer: Self-pay | Admitting: Family Medicine

## 2021-10-16 VITALS — Ht 65.0 in

## 2021-10-16 DIAGNOSIS — E782 Mixed hyperlipidemia: Secondary | ICD-10-CM | POA: Diagnosis not present

## 2021-10-16 DIAGNOSIS — I10 Essential (primary) hypertension: Secondary | ICD-10-CM | POA: Diagnosis not present

## 2021-10-16 DIAGNOSIS — G2581 Restless legs syndrome: Secondary | ICD-10-CM

## 2021-10-16 MED ORDER — ROPINIROLE HCL 5 MG PO TABS: ORAL_TABLET | ORAL | 2 refills | Status: DC

## 2021-10-16 MED ORDER — LOSARTAN POTASSIUM 50 MG PO TABS: 50.0000 mg | ORAL_TABLET | Freq: Every day | ORAL | 1 refills | Status: DC

## 2021-10-16 MED ORDER — PRAVASTATIN SODIUM 20 MG PO TABS: ORAL_TABLET | ORAL | 1 refills | Status: DC

## 2021-10-16 NOTE — Assessment & Plan Note (Signed)
Problem is stable. ?Continue ropinirole 5 mg daily and 1 mg daily as needed. ?

## 2021-10-16 NOTE — Assessment & Plan Note (Signed)
Continue pravastatin 20 mg daily and low-fat diet. ?She will call to arrange lab appointment in the next 1-2 weeks. ?

## 2021-10-16 NOTE — Assessment & Plan Note (Signed)
BP seems to be adequately controlled. ?Continue losartan 50 mg daily. ?Continue low-salt diet. ?Recommend monitoring BP regularly. ?She will call to arrange lab appointment. ? ?

## 2021-10-16 NOTE — Progress Notes (Signed)
Virtual Visit via Telephone Note ?I connected with Andrea Kelley on 10/16/21 at  4:00 PM EST by telephone and verified that I am speaking with the correct person using two identifiers. ? ?We tried a few times to connect on video but Andrea Kelley was having issues with Internet connection, so changed to telephone visit. ? ?I discussed the limitations, risks, security and privacy concerns of performing an evaluation and management service by telephone and the availability of in person appointments. I also discussed with the patient that there may be a patient responsible charge related to this service. The patient expressed understanding and agreed to proceed. ? ?Location patient: home ?Location provider: work office ?Participants present for the call: patient, provider ?Patient did not have a visit in the prior 7 days to address this/these issue(s). ? ?Chief Complaint  ?Patient presents with  ? Medication Refill  ? ?HPI: ?Andrea Kelley is a 81 year old female following on some of her chronic medical problems. ?Last visit on 06/05/2020. ?Since her last visit she has been following with orthopedist, Dr. Cleophas Dunker, due to close bimalleolar fracture left ankle.  Next follow-up appointment is tomorrow. ?Hypertension: Currently she is on losartan 50 mg daily.  Negative for ?Checking BP occasionally and it is usually fine. ?Denies severe/frequent headache, visual changes, chest pain, dyspnea, palpitation, claudication, focal weakness, or edema. ?Lab Results  ?Component Value Date  ? CREATININE 0.65 06/05/2020  ? BUN 17 06/05/2020  ? NA 139 06/05/2020  ? K 4.3 06/05/2020  ? CL 106 06/05/2020  ? CO2 23 06/05/2020  ? ?HLD: She is on pravastatin 20 mg. ?Tolerating medication well. ?Lab Results  ?Component Value Date  ? CHOL 143 06/05/2020  ? HDL 44 (L) 06/05/2020  ? LDLCALC 72 06/05/2020  ? LDLDIRECT 82.0 05/31/2018  ? TRIG 196 (H) 06/05/2020  ? CHOLHDL 3.3 06/05/2020  ? ?RLS: She is on Ropinirole 5 mg daily, which she has been  taking since 2008 and partially controlled  symptoms. She takes ropinirole 1 mg seldom and usually when she sits for long time to watch TV.   ?Negative for calves edema or erythema. ? ?Observations/Objective: ?Patient sounds cheerful and well on the phone. ?I do not appreciate any SOB. ?Speech and thought processing are grossly intact. ?Patient reported vitals:Ht 5\' 5"  (1.651 m)   BMI 26.19 kg/m?  ? ?Assessment and Plan: ?Hyperlipidemia ?Continue pravastatin 20 mg daily and low-fat diet. ?She will call to arrange lab appointment in the next 1-2 weeks. ? ?Essential (primary) hypertension ?BP seems to be adequately controlled. ?Continue losartan 50 mg daily. ?Continue low-salt diet. ?Recommend monitoring BP regularly. ?She will call to arrange lab appointment. ? ? ?RESTLESS LEG SYNDROME ?Problem is stable. ?Continue ropinirole 5 mg daily and 1 mg daily as needed. ? ?Follow Up Instructions: ? ?Return in about 6 months (around 04/18/2022). ? ?I did not refer this patient for an OV in the next 24 hours for this/these issue(s). ? ?I discussed the assessment and treatment plan with the patient. The patient was provided an opportunity to ask questions and all were answered. The patient agreed with the plan and demonstrated an understanding of the instructions. ?  ?The patient was advised to call back or seek an in-person evaluation if the symptoms worsen or if the condition fails to improve as anticipated. ? ?I provided 12 minutes of non-face-to-face time during this encounter. ? ?Carrington Olazabal 06/18/2022, MD  ? ?

## 2021-10-17 ENCOUNTER — Ambulatory Visit (INDEPENDENT_AMBULATORY_CARE_PROVIDER_SITE_OTHER): Payer: Medicare Other

## 2021-10-17 ENCOUNTER — Encounter: Payer: Self-pay | Admitting: Orthopaedic Surgery

## 2021-10-17 ENCOUNTER — Ambulatory Visit: Payer: Medicare Other | Admitting: Orthopaedic Surgery

## 2021-10-17 ENCOUNTER — Other Ambulatory Visit: Payer: Self-pay

## 2021-10-17 DIAGNOSIS — S82842A Displaced bimalleolar fracture of left lower leg, initial encounter for closed fracture: Secondary | ICD-10-CM | POA: Diagnosis not present

## 2021-10-17 NOTE — Progress Notes (Signed)
? ?Office Visit Note ?  ?Patient: Andrea Kelley           ?Date of Birth: 1940-11-21           ?MRN: [id number] ?Visit Date: 10/17/2021 ?             ?Requested by: [place], Betty G, MD ?[phone] ?[place],  [place] [address] ?PCP: [place], Betty G, MD ? ? ?Assessment & Plan: ?Visit Diagnoses:  ?1. Closed bimalleolar fracture of left ankle, initial encounter   ? ? ?Plan: 7 weeks status post left ankle fracture as previously identified.  There is a bimalleolar fracture with 1 to 2 mm of medial ankle mortise widening.  No change in today's films from prior films in the ankle mortise but there appears to be healing with callus of the malleolar fractures.  Actually doing quite well.  Has not been ambulating yet in the equalizer boot but I think at this point she can begin to bear full weight weaning herself from the boot.  She does have a lace up ankle support which she should wear during the day.  She will also work on range of motion exercises and we will recheck her back in a month.  She is really doing quite well and having minimal discomfort ? ?Follow-Up Instructions: Return in about 1 month (around 11/17/2021).  ? ?Orders:  ?Orders Placed This Encounter  ?Procedures  ? XR Ankle Complete Left  ? ?No orders of the defined types were placed in this encounter. ? ? ? ? Procedures: ?No procedures performed ? ? ?Clinical Data: ?No additional findings. ? ? ?Subjective: ?Chief Complaint  ?Patient presents with  ? Left Ankle - Follow-up  ?  Bimall fracture  ?7 weeks status post bimalleolar fracture of the left ankle with some displacement and widening of the ankle mortise.  Doing well with minimal discomfort ? ?HPI ? ?Review of Systems ? ? ?Objective: ?Vital Signs: There were no vitals taken for this visit. ? ?Physical Exam ? ?Ortho Exam equalizer boot removed.  There is some mild swelling about the ankle but little if any discomfort over either malleolus.  No obvious deformity.  Good pulses neurologically intact.   Some limited range of motion in dorsiflexion and plantarflexion skin intact ? ?Specialty Comments:  ?No specialty comments available. ? ?Imaging: ?XR Ankle Complete Left ? ?Result Date: 10/17/2021 ?As of the left ankle obtained in 3 projections.  The previously identified medial and lateral malleolar fractures are unchanged in position and appear to be healing with opacification of the fracture lines and some callus.  There is widening of the medial ankle mortise but no change from prior films.  Approximately 1 to 2 mm of widening  ? ? ?PMFS History: ?Patient Active Problem List  ? Diagnosis Date Noted  ? Bimalleolar fracture of left ankle 09/05/2021  ? Routine general medical examination at a health care facility 05/27/2018  ? Essential (primary) hypertension 05/20/2017  ? DDD (degenerative disc disease), lumbar 01/16/2011  ? RESTLESS LEG SYNDROME 06/07/2009  ? Hyperglycemia 06/07/2009  ? Hyperlipidemia 07/19/2007  ? ?Past Medical History:  ?Diagnosis Date  ? DDD (degenerative disc disease), lumbar   ? Hyperlipidemia   ? Osteopenia   ? [place]  ? Syncope   ?  ?Family History  ?Problem Relation Age of Onset  ? Lung cancer Father   ?     smoker  ? Rheum arthritis Father   ? Heart attack Mother   ?  13s  ? Stroke Mother   ?     in 79s  ? Breast cancer Maternal Aunt   ? Diabetes Neg Hx   ?  ?Past Surgical History:  ?Procedure Laterality Date  ? ABDOMINAL HYSTERECTOMY    ? For dysfunctional bleeding; no BSO  ? BACK SURGERY  1970  ? ruptured disc ; Dr Roxan Hockey  ? COLONOSCOPY  2001  ? Negative; done for rectal bleeding  ? LOOP RECORDER INSERTION N/A 02/16/2017  ? Procedure: Loop Recorder Insertion;  Surgeon: Thurmon Fair, MD;  Location: [facility];  Service: Cardiovascular;  Laterality: N/A;  ? ?Social History  ? ?Occupational History  ? Occupation: Retired Landscape architect, trained in Facilities manager therapy  ?Tobacco Use  ? Smoking status: Never  ? Smokeless tobacco: Never  ?Substance and Sexual Activity  ?  Alcohol use: Yes  ?  Alcohol/week: 7.0 standard drinks  ?  Types: 7 Glasses of wine per week  ? Drug use: No  ? Sexual activity: Not on file  ? ? ? ?Valeria Batman, MD ? ? ?Note - This record has been created using [employer].  ?Chart creation errors have been sought, but may not always  ?have been located. Such creation errors do not reflect on  ?the standard of medical care. ? ?

## 2021-10-18 ENCOUNTER — Telehealth: Payer: Self-pay

## 2021-10-18 ENCOUNTER — Other Ambulatory Visit: Payer: Self-pay | Admitting: Physician Assistant

## 2021-10-18 DIAGNOSIS — S82842A Displaced bimalleolar fracture of left lower leg, initial encounter for closed fracture: Secondary | ICD-10-CM

## 2021-10-18 NOTE — Telephone Encounter (Signed)
Ok for PT for ROM and strengthening left ankle

## 2021-10-18 NOTE — Telephone Encounter (Signed)
Patient called into the office and wanted to know if they can have an order sent in for pt. Please advise  ?

## 2021-10-22 NOTE — Therapy (Signed)
?OUTPATIENT PHYSICAL THERAPY LOWER EXTREMITY EVALUATION ? ? ?Patient Name: Andrea Kelley ?MRN: [id number] ?DOB:1941-03-26, 81 y.o., female ?Today's Date: 10/23/2021 ? ? PT End of Session - 10/23/21 0947   ? ? Visit Number 1   ? Date for PT Re-Evaluation 12/18/21   ? Authorization Type BCBS Medicare   ? Progress Note Due on Visit 10   ? PT Start Time 0800   ? PT Stop Time [phone]   ? PT Time Calculation (min) 43 min   ? Activity Tolerance Patient tolerated treatment well   ? Behavior During Therapy [facility] for tasks assessed/performed   ? ?  ?  ? ?  ? ? ?Past Medical History:  ?Diagnosis Date  ? DDD (degenerative disc disease), lumbar   ? Hyperlipidemia   ? Osteopenia   ? [place]  ? Syncope   ? ?Past Surgical History:  ?Procedure Laterality Date  ? ABDOMINAL HYSTERECTOMY    ? For dysfunctional bleeding; no BSO  ? BACK SURGERY  1970  ? ruptured disc ; Dr Quentin Cornwall  ? COLONOSCOPY  2001  ? Negative; done for rectal bleeding  ? LOOP RECORDER INSERTION N/A 02/16/2017  ? Procedure: Loop Recorder Insertion;  Surgeon: Sanda Klein, MD;  Location: [facility] [facility];  Service: Cardiovascular;  Laterality: N/A;  ? ?Patient Active Problem List  ? Diagnosis Date Noted  ? Bimalleolar fracture of left ankle 09/05/2021  ? Routine general medical examination at a health care facility 05/27/2018  ? Essential (primary) hypertension 05/20/2017  ? DDD (degenerative disc disease), lumbar 01/16/2011  ? RESTLESS LEG SYNDROME 06/07/2009  ? Hyperglycemia 06/07/2009  ? Hyperlipidemia 07/19/2007  ? ? ?PCP: [place]ique, Betty G, MD ? ?REFERRING PROVIDER: Persons, Bevely Palmer, MD ? ?REFERRING DIAG: Closed bimalleolar fracture of the Lt ankle, initial encounter.  ? ?THERAPY DIAG:  ?Pain in left ankle and joints of left foot - Plan: PT plan of care cert/re-cert ? ?Muscle weakness (generalized) - Plan: PT plan of care cert/re-cert ? ?Other abnormalities of gait and mobility - Plan: PT plan of care cert/re-cert ? ?ONSET DATE: 08/31/21 ? ?SUBJECTIVE:   ? ?SUBJECTIVE STATEMENT: ?Pt presents to PT 8 weeks s/p closed Lt bimalleolar fracture sustained 08/31/21 when she fell down the steps.  Pt was NWB until 6 days ago.  Pt is now in CAM boot and is WBAT.  Pt is nervous to weightbear due to pain with standing with weight through the Lt ankle.   ? ?PERTINENT HISTORY: ?Osteopenia, lumbar DDD, Lt ankle fracture 08/31/21 due to fall down the steps. ? ?PAIN:  ?Are you having pain? Up to 5/10 with weightbearing Yes: NPRS scale: 0/10 ?Pain location: Lt ankle and heel ?Pain description: "it doesn't feel good" ?Aggravating factors: with WB ?Relieving factors: rest, elevation, ice ? ?PRECAUTIONS: None  Pt is now WBAT in CAM boot  and wean to ASO per MD note. ? ?WEIGHT BEARING RESTRICTIONS Yes WBAT in CAM boot per MD  and wean to ASO as able ? ?FALLS:  ?Has patient fallen in last 6 months? Yes, Number of falls: 1- down the steps resulting in ankle injury.  ? ?LIVING ENVIRONMENT: ?Lives with: lives alone ?Lives in: House/apartment ?Stairs: Yes; External: 4 steps; on right going up ?Has following equipment at home: Crutches ? ?OCCUPATION: retired  ? ?PLOF: Independent ? ?PATIENT GOALS return to walking without boot.  No limits in standing/walking. Get up and down steps with independence ? ? ?OBJECTIVE:  ? ?DIAGNOSTIC FINDINGS: 10/17/21: As of the left ankle  obtained in 3 projections.  The previously identified  ?medial and lateral malleolar fractures are unchanged in position and  ?appear to be healing with opacification of the fracture lines and some  ?callus.  There is widening of the medial ankle mortise but no change from  ?prior films.  Approximately 1 to 2 mm of widening ? ?PATIENT SURVEYS:  ?FOTO 70 (goal is 37) ? ?COGNITION: ? Overall cognitive status: Within functional limits for tasks assessed   ?  ?SENSATION: ?WFL ? ?POSTURE:  ?WFLs  ? ?PALPATION: ?Edema in Lt foot dorsum, reduced metatarsal mobility, palpable tenderness over bil ankle at malleolus. ? ?LE ROM: ? ?Active ROM  Right ?10/23/2021 Left ?10/23/2021  ?Hip flexion    ?Hip extension    ?Hip abduction    ?Hip adduction    ?Hip internal rotation    ?Hip external rotation    ?Knee flexion    ?Knee extension    ?Ankle dorsiflexion FULL 0  ?Ankle plantarflexion FULL full  ?Ankle inversion FULL Limited by 25%  ?Ankle eversion FULL Limited by 50%  ? (Blank rows = not tested) ? ?LE MMT: ? ?MMT Right ?10/23/2021 Left ?10/23/2021  ?Hip flexion    ?Hip extension    ?Hip abduction    ?Hip adduction    ?Hip internal rotation    ?Hip external rotation    ?Knee flexion    ?Knee extension    ?Ankle dorsiflexion  4/5  ?Ankle plantarflexion  NT  ?Ankle inversion  4/5  ?Ankle eversion  4-/5  ? (Blank rows = not tested) ? ?GAIT: ?Distance walked: 30 ?Assistive device utilized: Crutches Tried walker and improved patter with rolling walker ?Level of assistance: SBA ?Comments: WBAT, antalgic and moderate UE support on walker  ?Steps: step-to with max UE support ? ?TODAY'S TREATMENT: ?10/23/21: HEP established- see below  ?Gait training with rolling walker and stair training with step-to and use of 2 rails ? ? ?PATIENT EDUCATION:  ?Education details: Access Code: [email or web address] ?Person educated: Patient ?Education method: Explanation, Demonstration, and Handouts ?Education comprehension: verbalized understanding and returned demonstration ? ? ?HOME EXERCISE PROGRAM: ?Access Code: [email or web address] ?URL: [email or web address] ?Date: 10/23/2021 ?Prepared by: Claiborne Billings ? ?Exercises ?Side to Side Weight Shift with Unilateral Counter Support - 3 x daily - 7 x weekly - 2 sets - 10 reps ?Stride Stance Weight Shift - 3 x daily - 7 x weekly - 2 sets - 10 reps ?Long Sitting Calf Stretch with Strap - 2 x daily - 7 x weekly - 10 reps - 10 hold ?Supine Ankle Inversion Eversion AROM - 4 x daily - 7 x weekly - 20 reps ?Supine Ankle Circles - 4 x daily - 7 x weekly - 20 reps ?Supine Ankle Dorsiflexion and Plantarflexion AROM - 4 x daily - 7 x weekly - 3 sets - 10 reps ?Seated  Heel Toe Raises - 3 x daily - 7 x weekly - 2 sets - 10 reps ?Seated Long Arc Quad - 3 x daily - 7 x weekly - 2 sets - 10 reps ?Seated March - 3 x daily - 7 x weekly - 2 sets - 10 reps ? ? ?ASSESSMENT: ? ?CLINICAL IMPRESSION: ?Patient is a 81 y.o. female  who was seen today for physical therapy evaluation and treatment for Lt ankle s/p closed bimalleolar fracture sustatined on 08/31/21 when she fell down the steps.  Pt is now WBAT in CAM boot and MD would like to to transition to ASO for all distances. Pt is fearful of  weightbearing on the Lt LE due to pain.  Pt with reduced Lt ankle ROM, strength and demonstrates antalgic gait with rolling walker and moderate UE support.  Pt is challenged with negotiating steps and requires max UE support with 2 rails to achieve this with step to pattern. Pt with reduced Lt ankle ROM and strength consistent with s/p immobilization of Lt ankle x 2 months and edema in the foot and ankle.  Patient will benefit from skilled PT to address the below impairments and improve overall function.  ? ? ?OBJECTIVE IMPAIRMENTS Abnormal gait, decreased activity tolerance, decreased balance, decreased coordination, decreased endurance, difficulty walking, decreased ROM, decreased strength, impaired flexibility, and pain.  ? ?ACTIVITY LIMITATIONS community activity, meal prep, and shopping.  ? ?PERSONAL FACTORS Age and 1 comorbidity: Lt ankle fracture   are also affecting patient's functional outcome.  ? ? ?REHAB POTENTIAL: Excellent ? ?CLINICAL DECISION MAKING: Stable/uncomplicated ? ?EVALUATION COMPLEXITY: Low ? ?GOALS: ?Goals reviewed with patient? Yes ? ?SHORT TERM GOALS: Target date: 11/20/21 ? ?Be independent in initial HEP ?Baseline:  ?Goal status: INITIAL ? ?2.  Ambulate with CAM boot or ASO with FWB with least restrictive device for all distances  ?Baseline: CAM boot PWB ?Goal status: INITIAL ? ?3.  Negotiate steps with step-to gait and use of 1 rail with independence for increased ease with  getting in/out of the house.   ?Baseline: step to with max UE support on 2 rails  ?Goal status: INITIAL ? ?4. Improve strength and reduce pain to tolerate standing and walking for 10-20 minutes for independenc

## 2021-10-23 ENCOUNTER — Other Ambulatory Visit: Payer: Self-pay

## 2021-10-23 ENCOUNTER — Ambulatory Visit: Payer: Medicare Other | Attending: Physician Assistant

## 2021-10-23 DIAGNOSIS — R2689 Other abnormalities of gait and mobility: Secondary | ICD-10-CM | POA: Diagnosis not present

## 2021-10-23 DIAGNOSIS — M6281 Muscle weakness (generalized): Secondary | ICD-10-CM

## 2021-10-23 DIAGNOSIS — S82842A Displaced bimalleolar fracture of left lower leg, initial encounter for closed fracture: Secondary | ICD-10-CM | POA: Diagnosis not present

## 2021-10-23 DIAGNOSIS — M25572 Pain in left ankle and joints of left foot: Secondary | ICD-10-CM | POA: Diagnosis not present

## 2021-10-25 ENCOUNTER — Ambulatory Visit: Payer: Medicare Other | Admitting: Physical Therapy

## 2021-10-25 ENCOUNTER — Other Ambulatory Visit: Payer: Self-pay

## 2021-10-25 ENCOUNTER — Encounter: Payer: Self-pay | Admitting: Physical Therapy

## 2021-10-25 DIAGNOSIS — R2689 Other abnormalities of gait and mobility: Secondary | ICD-10-CM

## 2021-10-25 DIAGNOSIS — M25572 Pain in left ankle and joints of left foot: Secondary | ICD-10-CM | POA: Diagnosis not present

## 2021-10-25 DIAGNOSIS — M6281 Muscle weakness (generalized): Secondary | ICD-10-CM

## 2021-10-25 DIAGNOSIS — S82842A Displaced bimalleolar fracture of left lower leg, initial encounter for closed fracture: Secondary | ICD-10-CM | POA: Diagnosis not present

## 2021-10-25 NOTE — Therapy (Signed)
?OUTPATIENT PHYSICAL THERAPY TREATMENT NOTE ? ? ?Patient Name: Andrea Kelley ?MRN: [id number] ?DOB:[birth year], 81 y.o., female ?Today's Date: 10/25/2021 ? ?PCP: [place], Betty G, MD ?REFERRING PROVIDER: Persons, Bevely Palmer, [place] ? ? PT End of Session - 10/25/21 0759   ? ? Visit Number 2   ? Date for PT Re-Evaluation 12/18/21   ? Authorization Type BCBS Medicare   ? Progress Note Due on Visit 10   ? PT Start Time 0800   ? Activity Tolerance Patient tolerated treatment well   ? Behavior During Therapy [facility] for tasks assessed/performed   ? ?  ?  ? ?  ? ? ?Past Medical History:  ?Diagnosis Date  ? DDD (degenerative disc disease), lumbar   ? Hyperlipidemia   ? Osteopenia   ? [place]  ? Syncope   ? ?Past Surgical History:  ?Procedure Laterality Date  ? ABDOMINAL HYSTERECTOMY    ? For dysfunctional bleeding; no BSO  ? BACK SURGERY  1970  ? ruptured disc ; Dr Quentin Cornwall  ? COLONOSCOPY  2001  ? Negative; done for rectal bleeding  ? LOOP RECORDER INSERTION N/A 02/16/2017  ? Procedure: Loop Recorder Insertion;  Surgeon: Sanda Klein, MD;  Location: [place] [facility];  Service: Cardiovascular;  Laterality: N/A;  ? ?Patient Active Problem List  ? Diagnosis Date Noted  ? Bimalleolar fracture of left ankle 09/05/2021  ? Routine general medical examination at a health care facility 05/27/2018  ? Essential (primary) hypertension 05/20/2017  ? DDD (degenerative disc disease), lumbar 01/16/2011  ? RESTLESS LEG SYNDROME 06/07/2009  ? Hyperglycemia 06/07/2009  ? Hyperlipidemia 07/19/2007  ? ? ?REFERRING DIAG: Closed bimalleolar fracture of the Lt ankle, initial encounter ? ?THERAPY DIAG:  ?Pain in left ankle and joints of left foot ? ?Muscle weakness (generalized) ? ?Other abnormalities of gait and mobility ? ?PERTINENT HISTORY: Osteopenia, lumbar DDD, Lt ankle fracture 08/31/21 due to fall down the steps ? ?PRECAUTIONS: Pt is now WBAT in CAM boot  and wean to ASO per MD note. ? ?SUBJECTIVE: pt got rolling walker to ambulate with.   ? ?PAIN:  ?Are you having pain? No ? ? ?OBJECTIVE:  ?  ?DIAGNOSTIC FINDINGS: 10/17/21: As of the left ankle obtained in 3 projections.  The previously identified  ?medial and lateral malleolar fractures are unchanged in position and  ?appear to be healing with opacification of the fracture lines and some  ?callus.  There is widening of the medial ankle mortise but no change from  ?prior films.  Approximately 1 to 2 mm of widening ?  ?PATIENT SURVEYS:  ?FOTO 15 (goal is 51) ?  ?COGNITION: ?          Overall cognitive status: Within functional limits for tasks assessed               ?           ?SENSATION: ?WFL ?  ?POSTURE:  ?WFLs  ?  ?PALPATION: ?Edema in Lt foot dorsum, reduced metatarsal mobility, palpable tenderness over bil ankle at malleolus. ?  ?LE ROM: ?  ?Active ROM Right ?10/23/2021 Left ?10/23/2021  ?Hip flexion      ?Hip extension      ?Hip abduction      ?Hip adduction      ?Hip internal rotation      ?Hip external rotation      ?Knee flexion      ?Knee extension      ?Ankle dorsiflexion FULL 0  ?Ankle plantarflexion  FULL full  ?Ankle inversion FULL Limited by 25%  ?Ankle eversion FULL Limited by 50%  ? (Blank rows = not tested) ?  ?LE MMT: ?  ?MMT Right ?10/23/2021 Left ?10/23/2021  ?Hip flexion      ?Hip extension      ?Hip abduction      ?Hip adduction      ?Hip internal rotation      ?Hip external rotation      ?Knee flexion      ?Knee extension      ?Ankle dorsiflexion   4/5  ?Ankle plantarflexion   NT  ?Ankle inversion   4/5  ?Ankle eversion   4-/5  ? (Blank rows = not tested) ?  ?GAIT: ?Distance walked: 30 ?Assistive device utilized: Crutches Tried walker and improved patter with rolling walker ?Level of assistance: SBA ?Comments: WBAT, antalgic and moderate UE support on walker  ?Steps: step-to with max UE support ?  ?TODAY'S TREATMENT: ? ?  10/1721 ?LAQ boot only for resistance 3 sec hold 2x10 ?Towel stretch 3x 20 sec : good return demo ?Ankle 4 way AROM 20x each ?MANUAL:  PROM LT ankle and toes all  planes, retrograde massage for edema, soft tissue work to [employer]. ?Yellow band ankle PF?DF 2x10: gave to HEP for HEP progression ?Game Ready 10 min 3 flakes medium compression with LE elevated. ? ? ?10/23/21: HEP established- see below  ?Gait training with rolling walker and stair training with step-to and use of 2 rails ?  ?  ?PATIENT EDUCATION:  ?Education details: Access Code: [email or web address] ?Person educated: Patient ?Education method: Explanation, Demonstration, and Handouts ?Education comprehension: verbalized understanding and returned demonstration ?  ?  ?HOME EXERCISE PROGRAM: ?Access Code: [email or web address] ?URL: [email or web address] ?Date: 10/23/2021 ?Prepared by: Claiborne Billings ?  ?Exercises ?Side to Side Weight Shift with Unilateral Counter Support - 3 x daily - 7 x weekly - 2 sets - 10 reps ?Stride Stance Weight Shift - 3 x daily - 7 x weekly - 2 sets - 10 reps ?Long Sitting Calf Stretch with Strap - 2 x daily - 7 x weekly - 10 reps - 10 hold ?Supine Ankle Inversion Eversion AROM - 4 x daily - 7 x weekly - 20 reps ?Supine Ankle Circles - 4 x daily - 7 x weekly - 20 reps ?Supine Ankle Dorsiflexion and Plantarflexion AROM - 4 x daily - 7 x weekly - 3 sets - 10 reps ?Seated Heel Toe Raises - 3 x daily - 7 x weekly - 2 sets - 10 reps ?Seated Long Arc Quad - 3 x daily - 7 x weekly - 2 sets - 10 reps ?Seated March - 3 x daily - 7 x weekly - 2 sets - 10 reps ?  ?  ?ASSESSMENT: ?  ?CLINICAL IMPRESSION: ?Pt arrives to PT using rolling walker and WBAT in her CAM boot. Pt essentially has no pain in her ankle. Mild edema that reduced with manual techniques and use of GAme Ready. Pt is independent in initial HEP. We added yellow band ankle PF to HEP today. No pain with P/AROM. Pt less apprehensive to put weight into her LTLE. ? ?  ?  ?OBJECTIVE IMPAIRMENTS Abnormal gait, decreased activity tolerance, decreased balance, decreased coordination, decreased endurance, difficulty walking, decreased ROM, decreased strength,  impaired flexibility, and pain.  ?  ?ACTIVITY LIMITATIONS community activity, meal prep, and shopping.  ?  ?PERSONAL FACTORS Age and 1 comorbidity: Lt ankle fracture   are also affecting patient's functional outcome.  ?  ?  ?  REHAB POTENTIAL: Excellent ?  ?CLINICAL DECISION MAKING: Stable/uncomplicated ?  ?EVALUATION COMPLEXITY: Low ? ?GOALS: ?Goals reviewed with patient? Yes ?  ?SHORT TERM GOALS: Target date: 11/20/21 ?  ?Be independent in initial HEP ?Baseline:  ?Goal status: Goal met 10/25/21 ?  ?2.  Ambulate with CAM boot or ASO with FWB with least restrictive device for all distances  ?Baseline: CAM boot PWB ?Goal status: INITIAL ?  ?3.  Negotiate steps with step-to gait and use of 1 rail with independence for increased ease with getting in/out of the house.   ?Baseline: step to with max UE support on 2 rails  ?Goal status: INITIAL ?  ?4. Improve strength and reduce pain to tolerate standing and walking for 10-20 minutes for independence with home tasks, ?           Goal status: INITIAL ?  ?LONG TERM GOALS: Target date: 12/18/2021 ?  ?Be independent in advanced HEP ?Baseline: no HEP ?Goal status: INITIAL ?  ?2.  Improve FOTO to > or = to 59 ?Baseline: 32 ?Goal status: INITIAL ?  ?3.  Wean from CAM boot to ASO without device for home and community distances  ?Baseline: CAM boot PWB ?Goal status: INITIAL ?  ?4.  Demonstrate 5/5 Lt ankle strength to improve stability on unlevel surfaces in the community ?Baseline: 4/5 ?Goal status: INITIAL ?  ?5.  Negotiate steps with step-over-step gait with 1 rail for independence with getting in/out of the house ?Baseline: step to to max UE support  ?Goal status: INITIAL ?  ?6.  Report < or = to 2/10 Lt LE pain with standing and walking to improve community independence  ?Baseline: 5+/10 ?Goal status: INITIAL ?  ?  ?PLAN: ?PT FREQUENCY: 2x/week ?  ?PT DURATION: 8 weeks ?  ?PLANNED INTERVENTIONS: Therapeutic exercises, Therapeutic activity, Neuromuscular re-education, Balance  training, Gait training, Patient/Family education, Joint manipulation, Joint mobilization, Stair training, Dry Needling, Cryotherapy, Moist heat, Taping, Vasopneumatic device, and Manual therapy ?  ?PLAN FOR NEXT SESSION

## 2021-10-28 ENCOUNTER — Other Ambulatory Visit: Payer: Self-pay

## 2021-10-28 ENCOUNTER — Ambulatory Visit: Payer: Medicare Other | Admitting: Physical Therapy

## 2021-10-28 ENCOUNTER — Other Ambulatory Visit: Payer: Self-pay | Admitting: Family Medicine

## 2021-10-28 ENCOUNTER — Encounter: Payer: Self-pay | Admitting: Physical Therapy

## 2021-10-28 DIAGNOSIS — M6281 Muscle weakness (generalized): Secondary | ICD-10-CM

## 2021-10-28 DIAGNOSIS — S82842A Displaced bimalleolar fracture of left lower leg, initial encounter for closed fracture: Secondary | ICD-10-CM | POA: Diagnosis not present

## 2021-10-28 DIAGNOSIS — I10 Essential (primary) hypertension: Secondary | ICD-10-CM

## 2021-10-28 DIAGNOSIS — M25572 Pain in left ankle and joints of left foot: Secondary | ICD-10-CM

## 2021-10-28 DIAGNOSIS — R2689 Other abnormalities of gait and mobility: Secondary | ICD-10-CM

## 2021-10-28 DIAGNOSIS — E782 Mixed hyperlipidemia: Secondary | ICD-10-CM

## 2021-10-28 NOTE — Therapy (Signed)
?OUTPATIENT PHYSICAL THERAPY TREATMENT NOTE ? ? ?Patient Name: Andrea Kelley ?MRN: [id number] ?DOB:Feb 21, 1941, 81 y.o., female ?Today's Date: 10/28/2021 ? ?PCP: Martinique, Betty G, MD ?REFERRING PROVIDER: Martinique, Betty G, MD ? ? PT End of Session - 10/28/21 0800   ? ? Visit Number 3   ? Date for PT Re-Evaluation 12/18/21   ? Authorization Type BCBS Medicare   ? Progress Note Due on Visit 10   ? PT Start Time 0800   ? PT Stop Time 0900   ? PT Time Calculation (min) 60 min   ? Activity Tolerance Patient tolerated treatment well   ? Behavior During Therapy [facility] for tasks assessed/performed   ? ?  ?  ? ?  ? ? ?Past Medical History:  ?Diagnosis Date  ? DDD (degenerative disc disease), lumbar   ? Hyperlipidemia   ? Osteopenia   ? [place]  ? Syncope   ? ?Past Surgical History:  ?Procedure Laterality Date  ? ABDOMINAL HYSTERECTOMY    ? For dysfunctional bleeding; no BSO  ? BACK SURGERY  1970  ? ruptured disc ; Dr Quentin Cornwall  ? COLONOSCOPY  2001  ? Negative; done for rectal bleeding  ? LOOP RECORDER INSERTION N/A 02/16/2017  ? Procedure: Loop Recorder Insertion;  Surgeon: Sanda Klein, MD;  Location: [place] [facility];  Service: Cardiovascular;  Laterality: N/A;  ? ?Patient Active Problem List  ? Diagnosis Date Noted  ? Bimalleolar fracture of left ankle 09/05/2021  ? Routine general medical examination at a health care facility 05/27/2018  ? Essential (primary) hypertension 05/20/2017  ? DDD (degenerative disc disease), lumbar 01/16/2011  ? RESTLESS LEG SYNDROME 06/07/2009  ? Hyperglycemia 06/07/2009  ? Hyperlipidemia 07/19/2007  ? ? ?REFERRING DIAG: Closed bimalleolar fracture of the Lt ankle, initial encounter ? ?THERAPY DIAG:  ?Pain in left ankle and joints of left foot ? ?Muscle weakness (generalized) ? ?Other abnormalities of gait and mobility ? ?PERTINENT HISTORY: Osteopenia, lumbar DDD, Lt ankle fracture 08/31/21 due to fall down the steps ? ?PRECAUTIONS: Pt is now WBAT in CAM boot  and wean to ASO per MD  note. ? ?SUBJECTIVE: No complaints his AM. I am thinking I might need a shoe lift on my shoe to even me out when I walk. Going down stairs are getting better/easier.  ? ?PAIN:  ?Are you having pain? No ? ? ?OBJECTIVE:  ?  ?DIAGNOSTIC FINDINGS: 10/17/21: As of the left ankle obtained in 3 projections.  The previously identified  ?medial and lateral malleolar fractures are unchanged in position and  ?appear to be healing with opacification of the fracture lines and some  ?callus.  There is widening of the medial ankle mortise but no change from  ?prior films.  Approximately 1 to 2 mm of widening ?  ?PATIENT SURVEYS:  ?FOTO 30 (goal is 90) ?  ?COGNITION: ?          Overall cognitive status: Within functional limits for tasks assessed               ?           ?SENSATION: ?WFL ?  ?POSTURE:  ?WFLs  ?  ?PALPATION: ?Edema in Lt foot dorsum, reduced metatarsal mobility, palpable tenderness over bil ankle at malleolus. ?  ?LE ROM: ?  ?Active ROM Right ?10/23/2021 Left ?10/23/2021 Left ?10/28/21  ?Hip flexion       ?Hip extension       ?Hip abduction       ?Hip  adduction       ?Hip internal rotation       ?Hip external rotation       ?Knee flexion       ?Knee extension       ?Ankle dorsiflexion FULL 0   ?Ankle plantarflexion FULL full   ?Ankle inversion FULL Limited by 25%   ?Ankle eversion FULL Limited by 50% Limited less than 25%, eyeballs equal to RT  ? (Blank rows = not tested) ?  ?LE MMT: ?  ?MMT Right ?10/23/2021 Left ?10/23/2021  ?Hip flexion      ?Hip extension      ?Hip abduction      ?Hip adduction      ?Hip internal rotation      ?Hip external rotation      ?Knee flexion      ?Knee extension      ?Ankle dorsiflexion   4/5  ?Ankle plantarflexion   NT  ?Ankle inversion   4/5  ?Ankle eversion   4-/5  ? (Blank rows = not tested) ?  ?GAIT: ?Distance walked: 30 ?Assistive device utilized: Crutches Tried walker and improved patter with rolling walker ?Level of assistance: SBA ?Comments: WBAT, antalgic and moderate UE support on  walker  ?Steps: step-to with max UE support ?  ?TODAY'S TREATMENT: ? ?10/28/21 ?Seated rocker board x 5 min with concurrent discussion of status. ?LAQ 3# 2x10 ?Red band ankle DF/PF 2x10 ?Manual resisted ev/Inv 10x each ?Manual: PROM all lanes ankle and toes, gastroc soft tissue mobilization, retrograde massage ?Game Ready x 15 min LT ankle 3 flakes medium compression ? ?  10/1721 ?LAQ boot only for resistance 3 sec hold 2x10 ?Towel stretch 3x 20 sec : good return demo ?Ankle 4 way AROM 20x each ?MANUAL:  PROM LT ankle and toes all planes, retrograde massage for edema, soft tissue work to [employer]. ?Yellow band ankle PF?DF 2x10: gave to HEP for HEP progression ?Game Ready 10 min 3 flakes medium compression with LE elevated. ? ? ?10/23/21: HEP established- see below  ?Gait training with rolling walker and stair training with step-to and use of 2 rails ?  ?  ?PATIENT EDUCATION:  ?Education details: Access Code: [email or web address] ?Person educated: Patient ?Education method: Explanation, Demonstration, and Handouts ?Education comprehension: verbalized understanding and returned demonstration ?  ?  ?HOME EXERCISE PROGRAM: ?Access Code: [email or web address] ?URL: [email or web address] ?Date: 10/23/2021 ?Prepared by: Claiborne Billings ?  ?Exercises ?Side to Side Weight Shift with Unilateral Counter Support - 3 x daily - 7 x weekly - 2 sets - 10 reps ?Stride Stance Weight Shift - 3 x daily - 7 x weekly - 2 sets - 10 reps ?Long Sitting Calf Stretch with Strap - 2 x daily - 7 x weekly - 10 reps - 10 hold ?Supine Ankle Inversion Eversion AROM - 4 x daily - 7 x weekly - 20 reps ?Supine Ankle Circles - 4 x daily - 7 x weekly - 20 reps ?Supine Ankle Dorsiflexion and Plantarflexion AROM - 4 x daily - 7 x weekly - 3 sets - 10 reps ?Seated Heel Toe Raises - 3 x daily - 7 x weekly - 2 sets - 10 reps ?Seated Long Arc Quad - 3 x daily - 7 x weekly - 2 sets - 10 reps ?Seated March - 3 x daily - 7 x weekly - 2 sets - 10 reps ?  ?  ?ASSESSMENT: ?  ?CLINICAL  IMPRESSION: Pt reports no current pain. Stairs at home are getting easier. Game ready  helping with edema, most edema at joint. Pt progressing with theraband exercises in NWB. Pt with no pain during any exercises.Active ankle eversion has improved: eyeballed AROM to be equal to RT ankle.  ? ?  ?  ?OBJECTIVE IMPAIRMENTS Abnormal gait, decreased activity tolerance, decreased balance, decreased coordination, decreased endurance, difficulty walking, decreased ROM, decreased strength, impaired flexibility, and pain.  ?  ?ACTIVITY LIMITATIONS community activity, meal prep, and shopping.  ?  ?PERSONAL FACTORS Age and 1 comorbidity: Lt ankle fracture   are also affecting patient's functional outcome.  ?  ?  ?REHAB POTENTIAL: Excellent ?  ?CLINICAL DECISION MAKING: Stable/uncomplicated ?  ?EVALUATION COMPLEXITY: Low ? ?GOALS: ?Goals reviewed with patient? Yes ?  ?SHORT TERM GOALS: Target date: 11/20/21 ?  ?Be independent in initial HEP ?Baseline:  ?Goal status: Goal met 10/25/21 ?  ?2.  Ambulate with CAM boot or ASO with FWB with least restrictive device for all distances  ?Baseline: CAM boot PWB ?Goal status: INITIAL ?  ?3.  Negotiate steps with step-to gait and use of 1 rail with independence for increased ease with getting in/out of the house.   ?Baseline: step to with max UE support on 2 rails  ?Goal status: INITIAL ?  ?4. Improve strength and reduce pain to tolerate standing and walking for 10-20 minutes for independence with home tasks, ?           Goal status: INITIAL ?  ?LONG TERM GOALS: Target date: 12/18/2021 ?  ?Be independent in advanced HEP ?Baseline: no HEP ?Goal status: INITIAL ?  ?2.  Improve FOTO to > or = to 59 ?Baseline: 32 ?Goal status: INITIAL ?  ?3.  Wean from CAM boot to ASO without device for home and community distances  ?Baseline: CAM boot PWB ?Goal status: INITIAL ?  ?4.  Demonstrate 5/5 Lt ankle strength to improve stability on unlevel surfaces in the community ?Baseline: 4/5 ?Goal status: INITIAL ?   ?5.  Negotiate steps with step-over-step gait with 1 rail for independence with getting in/out of the house ?Baseline: step to to max UE support  ?Goal status: INITIAL ?  ?6.  Report < or = to 2/10 Lt LE pain with

## 2021-10-30 ENCOUNTER — Other Ambulatory Visit: Payer: Self-pay

## 2021-10-30 ENCOUNTER — Ambulatory Visit: Payer: Medicare Other

## 2021-10-30 DIAGNOSIS — R2689 Other abnormalities of gait and mobility: Secondary | ICD-10-CM

## 2021-10-30 DIAGNOSIS — M6281 Muscle weakness (generalized): Secondary | ICD-10-CM

## 2021-10-30 DIAGNOSIS — M25572 Pain in left ankle and joints of left foot: Secondary | ICD-10-CM | POA: Diagnosis not present

## 2021-10-30 DIAGNOSIS — S82842A Displaced bimalleolar fracture of left lower leg, initial encounter for closed fracture: Secondary | ICD-10-CM | POA: Diagnosis not present

## 2021-10-30 NOTE — Therapy (Signed)
?OUTPATIENT PHYSICAL THERAPY TREATMENT NOTE ? ? ?Patient Name: Andrea Kelley ?MRN: [id number] ?DOB:06/27/1941, 81 y.o., female ?Today's Date: 10/30/2021 ? ?PCP: Martinique, Betty G, MD ?REFERRING PROVIDER: [place], Betty G, MD ? ? PT End of Session - 10/30/21 0843   ? ? Visit Number 4   ? Date for PT Re-Evaluation 12/18/21   ? Authorization Type BCBS Medicare   ? Progress Note Due on Visit 10   ? PT Start Time 0800   ? PT Stop Time 0844   ? PT Time Calculation (min) 44 min   ? Activity Tolerance Patient tolerated treatment well   ? Behavior During Therapy [facility] for tasks assessed/performed   ? ?  ?  ? ?  ? ? ? ?Past Medical History:  ?Diagnosis Date  ? DDD (degenerative disc disease), lumbar   ? Hyperlipidemia   ? Osteopenia   ? [place]  ? Syncope   ? ?Past Surgical History:  ?Procedure Laterality Date  ? ABDOMINAL HYSTERECTOMY    ? For dysfunctional bleeding; no BSO  ? BACK SURGERY  1970  ? ruptured disc ; Dr Quentin Cornwall  ? COLONOSCOPY  2001  ? Negative; done for rectal bleeding  ? LOOP RECORDER INSERTION N/A 02/16/2017  ? Procedure: Loop Recorder Insertion;  Surgeon: Sanda Klein, MD;  Location: [place] [facility];  Service: Cardiovascular;  Laterality: N/A;  ? ?Patient Active Problem List  ? Diagnosis Date Noted  ? Bimalleolar fracture of left ankle 09/05/2021  ? Routine general medical examination at a health care facility 05/27/2018  ? Essential (primary) hypertension 05/20/2017  ? DDD (degenerative disc disease), lumbar 01/16/2011  ? RESTLESS LEG SYNDROME 06/07/2009  ? Hyperglycemia 06/07/2009  ? Hyperlipidemia 07/19/2007  ? ? ?REFERRING DIAG: Closed bimalleolar fracture of the Lt ankle, initial encounter ? ?THERAPY DIAG:  ?Pain in left ankle and joints of left foot ? ?Other abnormalities of gait and mobility ? ?Muscle weakness (generalized) ? ?PERTINENT HISTORY: Osteopenia, lumbar DDD, Lt ankle fracture 08/31/21 due to fall down the steps ? ?PRECAUTIONS: Pt is now WBAT in CAM boot  and wean to ASO per MD  note. ? ?SUBJECTIVE: I've been walking some at home with the ASO.  My lateral ankle was rubbing on my boot.   ? ?PAIN:  ?Are you having pain? No ? ? ?OBJECTIVE:  ?  ?DIAGNOSTIC FINDINGS: 10/17/21: As of the left ankle obtained in 3 projections.  The previously identified  ?medial and lateral malleolar fractures are unchanged in position and  ?appear to be healing with opacification of the fracture lines and some  ?callus.  There is widening of the medial ankle mortise but no change from  ?prior films.  Approximately 1 to 2 mm of widening ?  ?PATIENT SURVEYS:  ?FOTO 82 (goal is 97) ?  ?COGNITION: ?          Overall cognitive status: Within functional limits for tasks assessed               ?           ?SENSATION: ?WFL ?  ?POSTURE:  ?WFLs  ?  ?PALPATION: ?Edema in Lt foot dorsum, reduced metatarsal mobility, palpable tenderness over bil ankle at malleolus. ?  ?LE ROM: ?  ?Active ROM Right ?10/23/2021 Left ?10/23/2021 Left ?10/28/21  ?Hip flexion       ?Hip extension       ?Hip abduction       ?Hip adduction       ?Hip internal  rotation       ?Hip external rotation       ?Knee flexion       ?Knee extension       ?Ankle dorsiflexion FULL 0   ?Ankle plantarflexion FULL full   ?Ankle inversion FULL Limited by 25%   ?Ankle eversion FULL Limited by 50% Limited less than 25%, eyeballs equal to RT  ? (Blank rows = not tested) ?  ?LE MMT: ?  ?MMT Right ?10/23/2021 Left ?10/23/2021  ?Hip flexion      ?Hip extension      ?Hip abduction      ?Hip adduction      ?Hip internal rotation      ?Hip external rotation      ?Knee flexion      ?Knee extension      ?Ankle dorsiflexion   4/5  ?Ankle plantarflexion   NT  ?Ankle inversion   4/5  ?Ankle eversion   4-/5  ? (Blank rows = not tested) ?  ?GAIT: ?Distance walked: 30 ?Assistive device utilized: Crutches Tried walker and improved patter with rolling walker ?Level of assistance: SBA ?Comments: WBAT, antalgic and moderate UE support on walker  ?Steps: step-to with max UE support ?  ?TODAY'S  TREATMENT: ?10/30/21 ?Seated rocker board x 5 min with concurrent discussion of status. ?Baps board: level 2- 4 ways x 1.5 min each- PT provided min assist to control the board.  ?NuStep: level 5x 8 minutes-PT present to discuss progress.  ?LAQ 3# 2x10 ?Red band ankle DF/PF/inversion/eversion x10 with red band ?Manual: PROM , calcaneal mobs,  gastroc and tibialis anterior,  soft tissue mobilization, retrograde massage ?Gait with ASO-  ?Game Ready-not performed due to no edema today ? ?10/28/21 ?Seated rocker board x 5 min with concurrent discussion of status. ?LAQ 3# 2x10 ?Red band ankle DF/PF 2x10 ?Manual resisted ev/Inv 10x each ?Manual: PROM all lanes ankle and toes, gastroc soft tissue mobilization, retrograde massage ?Game Ready x 15 min LT ankle 3 flakes medium compression ? ?10/1721 ?LAQ boot only for resistance 3 sec hold 2x10 ?Towel stretch 3x 20 sec : good return demo ?Ankle 4 way AROM 20x each ?MANUAL:  PROM LT ankle and toes all planes, retrograde massage for edema, soft tissue work to [employer]. ?Yellow band ankle PF?DF 2x10: gave to HEP for HEP progression ?Game Ready 10 min 3 flakes medium compression with LE elevated. ? ?  ?PATIENT EDUCATION:  ?Education details: Access Code: [email or web address] ?Person educated: Patient ?Education method: Explanation, Demonstration, and Handouts ?Education comprehension: verbalized understanding and returned demonstration ?  ?  ?HOME EXERCISE PROGRAM: ?Access Code: [email or web address] ?URL: [email or web address] ?Date: 10/23/2021 ?Prepared by: Claiborne Billings ?  ?Exercises ?Side to Side Weight Shift with Unilateral Counter Support - 3 x daily - 7 x weekly - 2 sets - 10 reps ?Stride Stance Weight Shift - 3 x daily - 7 x weekly - 2 sets - 10 reps ?Long Sitting Calf Stretch with Strap - 2 x daily - 7 x weekly - 10 reps - 10 hold ?Supine Ankle Inversion Eversion AROM - 4 x daily - 7 x weekly - 20 reps ?Supine Ankle Circles - 4 x daily - 7 x weekly - 20 reps ?Supine Ankle Dorsiflexion and  Plantarflexion AROM - 4 x daily - 7 x weekly - 3 sets - 10 reps ?Seated Heel Toe Raises - 3 x daily - 7 x weekly - 2 sets - 10 reps ?Seated Long Arc Quad - 3 x daily - 7 x  weekly - 2 sets - 10 reps ?Seated March - 3 x daily - 7 x weekly - 2 sets - 10 reps ?  ?  ?ASSESSMENT: ?  ?CLINICAL IMPRESSION: Pt is making steady progress regarding gait and mobility.  Pt is walking all distances with rolling walker.  Pt has been walking with ASO for home distances.  Pt was challenged with seated Baps board level 2 and required minor assist by PT to control the board.  Pt demonstrates good isolation of ankle muscles with minimal compensation with hip.  Pt with reduced calcaneal mobility and very mild edema today.  Pt did well with gait in ASO and will try to find a shoe that works for her to wear with this.  Pt will continue to benefit from skilled PT to address gait, ankle strength and edema management.   ? ?  ?  ?OBJECTIVE IMPAIRMENTS Abnormal gait, decreased activity tolerance, decreased balance, decreased coordination, decreased endurance, difficulty walking, decreased ROM, decreased strength, impaired flexibility, and pain.  ?  ?ACTIVITY LIMITATIONS community activity, meal prep, and shopping.  ?  ?PERSONAL FACTORS Age and 1 comorbidity: Lt ankle fracture   are also affecting patient's functional outcome.  ?  ?  ?REHAB POTENTIAL: Excellent ?  ?CLINICAL DECISION MAKING: Stable/uncomplicated ?  ?EVALUATION COMPLEXITY: Low ? ?GOALS: ?Goals reviewed with patient? Yes ?  ?SHORT TERM GOALS: Target date: 11/20/21 ?  ?Be independent in initial HEP ?Baseline:  ?Goal status: Goal met 10/25/21 ?  ?2.  Ambulate with CAM boot or ASO with FWB with least restrictive device for all distances  ?Baseline: CAM boot PWB ?Goal status: INITIAL ?  ?3.  Negotiate steps with step-to gait and use of 1 rail with independence for increased ease with getting in/out of the house.   ?Baseline: step to with max UE support on 2 rails  ?Goal status: INITIAL ?   ?4. Improve strength and reduce pain to tolerate standing and walking for 10-20 minutes for independence with home tasks, ?           Goal status: INITIAL ?  ?LONG TERM GOALS: Target date: 12/18/2021 ?  ?Be indep

## 2021-11-06 ENCOUNTER — Ambulatory Visit: Payer: Medicare Other

## 2021-11-06 ENCOUNTER — Other Ambulatory Visit: Payer: Self-pay

## 2021-11-06 DIAGNOSIS — M25572 Pain in left ankle and joints of left foot: Secondary | ICD-10-CM

## 2021-11-06 DIAGNOSIS — M6281 Muscle weakness (generalized): Secondary | ICD-10-CM | POA: Diagnosis not present

## 2021-11-06 DIAGNOSIS — R2689 Other abnormalities of gait and mobility: Secondary | ICD-10-CM | POA: Diagnosis not present

## 2021-11-06 DIAGNOSIS — S82842A Displaced bimalleolar fracture of left lower leg, initial encounter for closed fracture: Secondary | ICD-10-CM | POA: Diagnosis not present

## 2021-11-06 NOTE — Therapy (Signed)
?OUTPATIENT PHYSICAL THERAPY TREATMENT NOTE ? ? ?Patient Name: Andrea Kelley ?MRN: [id number] ?DOB:[birth year], 81 y.o., female ?Today's Date: 11/06/2021 ? ?PCP: Martinique, Betty G, MD ?REFERRING PROVIDER: Martinique, Betty G, MD ? ? PT End of Session - 11/06/21 0930   ? ? Visit Number 5   ? Date for PT Re-Evaluation 12/18/21   ? Authorization Type BCBS Medicare   ? Progress Note Due on Visit 10   ? PT Start Time [phone]   ? PT Stop Time 0929   ? PT Time Calculation (min) 42 min   ? Activity Tolerance Patient tolerated treatment well   ? Behavior During Therapy [facility] for tasks assessed/performed   ? ?  ?  ? ?  ? ? ? ? ?Past Medical History:  ?Diagnosis Date  ? DDD (degenerative disc disease), lumbar   ? Hyperlipidemia   ? Osteopenia   ? [place] [facility]  ? Syncope   ? ?Past Surgical History:  ?Procedure Laterality Date  ? ABDOMINAL HYSTERECTOMY    ? For dysfunctional bleeding; no BSO  ? BACK SURGERY  1970  ? ruptured disc ; Dr Quentin Cornwall  ? COLONOSCOPY  2001  ? Negative; done for rectal bleeding  ? LOOP RECORDER INSERTION N/A 02/16/2017  ? Procedure: Loop Recorder Insertion;  Surgeon: Sanda Klein, MD;  Location: [facility];  Service: Cardiovascular;  Laterality: N/A;  ? ?Patient Active Problem List  ? Diagnosis Date Noted  ? Bimalleolar fracture of left ankle 09/05/2021  ? Routine general medical examination at a health care facility 05/27/2018  ? Essential (primary) hypertension 05/20/2017  ? DDD (degenerative disc disease), lumbar 01/16/2011  ? RESTLESS LEG SYNDROME 06/07/2009  ? Hyperglycemia 06/07/2009  ? Hyperlipidemia 07/19/2007  ? ? ?REFERRING DIAG: Closed bimalleolar fracture of the Lt ankle, initial encounter ? ?THERAPY DIAG:  ?Pain in left ankle and joints of left foot ? ?Other abnormalities of gait and mobility ? ?Muscle weakness (generalized) ? ?PERTINENT HISTORY: Osteopenia, lumbar DDD, Lt ankle fracture 08/31/21 due to fall down the steps ? ?PRECAUTIONS: Pt is now WBAT in CAM boot  and wean to ASO per MD  note. ? ?SUBJECTIVE: I'm wearing my ankle brace all the time now ? ?PAIN:  ?PAIN:  ?Are you having pain? Yes ?NPRS scale: 5/10 ?Pain location: Lt foot  ?Pain orientation: Left  ?PAIN TYPE: aching and tight ?Pain description: intermittent  ?Aggravating factors: standing, walking ?Relieving factors: rest, ice, ibuprofen  ? ? ?OBJECTIVE:  ?  ?DIAGNOSTIC FINDINGS: 10/17/21: As of the left ankle obtained in 3 projections.  The previously identified  ?medial and lateral malleolar fractures are unchanged in position and  ?appear to be healing with opacification of the fracture lines and some  ?callus.  There is widening of the medial ankle mortise but no change from  ?prior films.  Approximately 1 to 2 mm of widening ?  ?PATIENT SURVEYS:  ?FOTO 70 (goal is 67) ?  ?COGNITION: ?          Overall cognitive status: Within functional limits for tasks assessed               ?           ?SENSATION: ?WFL ?  ?POSTURE:  ?WFLs  ?  ?PALPATION: ?Edema in Lt foot dorsum, reduced metatarsal mobility, palpable tenderness over bil ankle at malleolus. ?  ?LE ROM: ?  ?Active ROM Right ?10/23/2021 Left ?10/23/2021 Left ?10/28/21  ?Hip flexion       ?Hip extension       ?  Hip abduction       ?Hip adduction       ?Hip internal rotation       ?Hip external rotation       ?Knee flexion       ?Knee extension       ?Ankle dorsiflexion FULL 0   ?Ankle plantarflexion FULL full   ?Ankle inversion FULL Limited by 25%   ?Ankle eversion FULL Limited by 50% Limited less than 25%, eyeballs equal to RT  ? (Blank rows = not tested) ?  ?LE MMT: ?  ?MMT Right ?10/23/2021 Left ?10/23/2021  ?Hip flexion      ?Hip extension      ?Hip abduction      ?Hip adduction      ?Hip internal rotation      ?Hip external rotation      ?Knee flexion      ?Knee extension      ?Ankle dorsiflexion   4/5  ?Ankle plantarflexion   NT  ?Ankle inversion   4/5  ?Ankle eversion   4-/5  ? (Blank rows = not tested) ?  ?GAIT: ?Distance walked: 30 ?Assistive device utilized: Crutches Tried walker  and improved patter with rolling walker ?Level of assistance: SBA ?Comments: WBAT, antalgic and moderate UE support on walker  ?Steps: step-to with max UE support ?  ?TODAY'S TREATMENT: ?11/06/21 ?Rocker board standing: x2 min ?Baps board: level 2- 4 ways x 1.5 min each- PT provided min assist to control the board.  ?NuStep: level 5x 8 minutes-PT present to discuss progress.  ?Weight shifting on balance pad: 3 ways x1 min each ?Standing hip abduction and extension: 2x10 bil each ?Gait with cane on Rt: level surface- verbal cues for hip alignment and symmetry ?Game Ready-not performed due to no edema today ? ?10/30/21 ?Seated rocker board x 5 min with concurrent discussion of status. ?Baps board: level 2- 4 ways x 1.5 min each- PT provided min assist to control the board.  ?NuStep: level 5x 8 minutes-PT present to discuss progress.  ?LAQ 3# 2x10 ?Red band ankle DF/PF/inversion/eversion x10 with red band ?Manual: PROM , calcaneal mobs,  gastroc and tibialis anterior,  soft tissue mobilization, retrograde massage ?Gait with ASO-  ?Game Ready-not performed due to no edema today ? ?10/28/21 ?Seated rocker board x 5 min with concurrent discussion of status. ?LAQ 3# 2x10 ?Red band ankle DF/PF 2x10 ?Manual resisted ev/Inv 10x each ?Manual: PROM all lanes ankle and toes, gastroc soft tissue mobilization, retrograde massage ?Game Ready x 15 min LT ankle 3 flakes medium compression ? ?  ?PATIENT EDUCATION:  ?Education details: Access Code: [email or web address] ?Person educated: Patient ?Education method: Explanation, Demonstration, and Handouts ?Education comprehension: verbalized understanding and returned demonstration ?  ?  ?HOME EXERCISE PROGRAM: ?Access Code: [email or web address] ?URL: [email or web address] ?Date: 10/23/2021 ?Prepared by: Claiborne Billings ?  ?Exercises ?Side to Side Weight Shift with Unilateral Counter Support - 3 x daily - 7 x weekly - 2 sets - 10 reps ?Stride Stance Weight Shift - 3 x daily - 7 x weekly - 2 sets - 10  reps ?Long Sitting Calf Stretch with Strap - 2 x daily - 7 x weekly - 10 reps - 10 hold ?Supine Ankle Inversion Eversion AROM - 4 x daily - 7 x weekly - 20 reps ?Supine Ankle Circles - 4 x daily - 7 x weekly - 20 reps ?Supine Ankle Dorsiflexion and Plantarflexion AROM - 4 x daily - 7 x weekly - 3 sets - 10 reps ?Seated  Heel Toe Raises - 3 x daily - 7 x weekly - 2 sets - 10 reps ?Seated Long Arc Quad - 3 x daily - 7 x weekly - 2 sets - 10 reps ?Seated March - 3 x daily - 7 x weekly - 2 sets - 10 reps ?  ?  ?ASSESSMENT: ?  ?CLINICAL IMPRESSION: Pt is making steady progress regarding gait and mobility.  Pt is walking all distances with rolling walker and ASO.  Pt is using less UE support on the walker and required verbal cues for Lt hip alignment.  Pt with continued challenge with seated Baps board level 2 and required minor assist by PT to control the board.  Pt did well with standing activity today including strength and weight shifting and required some verbal and tactile cues for glute med activation and level pelvis.  P Pt will continue to benefit from skilled PT to address gait, ankle strength and edema management.   ? ?  ?  ?OBJECTIVE IMPAIRMENTS Abnormal gait, decreased activity tolerance, decreased balance, decreased coordination, decreased endurance, difficulty walking, decreased ROM, decreased strength, impaired flexibility, and pain.  ?  ?ACTIVITY LIMITATIONS community activity, meal prep, and shopping.  ?  ?PERSONAL FACTORS Age and 1 comorbidity: Lt ankle fracture   are also affecting patient's functional outcome.  ?  ?  ?REHAB POTENTIAL: Excellent ?  ?CLINICAL DECISION MAKING: Stable/uncomplicated ?  ?EVALUATION COMPLEXITY: Low ? ?GOALS: ?Goals reviewed with patient? Yes ?  ?SHORT TERM GOALS: Target date: 11/20/21 ?  ?Be independent in initial HEP ?Baseline:  ?Goal status: Goal met 10/25/21 ?  ?2.  Ambulate with CAM boot or ASO with FWB with least restrictive device for all distances  ?Baseline: wearing  ASO, 50% of the time, weightbearing FWB (11/06/21) ?Goal status: In progress ?  ?3.  Negotiate steps with step-to gait and use of 1 rail with independence for increased ease with getting in/out of the house.   ?Baseline

## 2021-11-08 ENCOUNTER — Ambulatory Visit: Payer: Medicare Other | Admitting: Physical Therapy

## 2021-11-08 ENCOUNTER — Encounter: Payer: Self-pay | Admitting: Physical Therapy

## 2021-11-08 DIAGNOSIS — R2689 Other abnormalities of gait and mobility: Secondary | ICD-10-CM

## 2021-11-08 DIAGNOSIS — M25572 Pain in left ankle and joints of left foot: Secondary | ICD-10-CM | POA: Diagnosis not present

## 2021-11-08 DIAGNOSIS — S82842A Displaced bimalleolar fracture of left lower leg, initial encounter for closed fracture: Secondary | ICD-10-CM | POA: Diagnosis not present

## 2021-11-08 DIAGNOSIS — M6281 Muscle weakness (generalized): Secondary | ICD-10-CM | POA: Diagnosis not present

## 2021-11-08 NOTE — Therapy (Signed)
?OUTPATIENT PHYSICAL THERAPY TREATMENT NOTE ? ? ?Patient Name: Andrea Kelley ?MRN: [id number] ?DOB:1941-02-19, 81 y.o., female ?Today's Date: 11/08/2021 ? ?PCP: Martinique, Betty G, MD ?REFERRING PROVIDER: [place]ique, Betty G, MD ? ? PT End of Session - 11/08/21 0751   ? ? Visit Number 6   ? Date for PT Re-Evaluation 12/18/21   ? Authorization Type BCBS Medicare   ? Progress Note Due on Visit 10   ? PT Start Time [phone]   ? PT Stop Time 0844   ? PT Time Calculation (min) 53 min   ? Activity Tolerance Patient tolerated treatment well   ? Behavior During Therapy [facility] for tasks assessed/performed   ? ?  ?  ? ?  ? ? ? ? ?Past Medical History:  ?Diagnosis Date  ? DDD (degenerative disc disease), lumbar   ? Hyperlipidemia   ? Osteopenia   ? [place]  ? Syncope   ? ?Past Surgical History:  ?Procedure Laterality Date  ? ABDOMINAL HYSTERECTOMY    ? For dysfunctional bleeding; no BSO  ? BACK SURGERY  1970  ? ruptured disc ; Dr Quentin Cornwall  ? COLONOSCOPY  2001  ? Negative; done for rectal bleeding  ? LOOP RECORDER INSERTION N/A 02/16/2017  ? Procedure: Loop Recorder Insertion;  Surgeon: Sanda Klein, MD;  Location: [place] [facility];  Service: Cardiovascular;  Laterality: N/A;  ? ?Patient Active Problem List  ? Diagnosis Date Noted  ? Bimalleolar fracture of left ankle 09/05/2021  ? Routine general medical examination at a health care facility 05/27/2018  ? Essential (primary) hypertension 05/20/2017  ? DDD (degenerative disc disease), lumbar 01/16/2011  ? RESTLESS LEG SYNDROME 06/07/2009  ? Hyperglycemia 06/07/2009  ? Hyperlipidemia 07/19/2007  ? ? ?REFERRING DIAG: Closed bimalleolar fracture of the Lt ankle, initial encounter ? ?THERAPY DIAG:  ?Pain in left ankle and joints of left foot ? ?Other abnormalities of gait and mobility ? ?Muscle weakness (generalized) ? ?PERTINENT HISTORY: Osteopenia, lumbar DDD, Lt ankle fracture 08/31/21 due to fall down the steps ? ?PRECAUTIONS: Pt is now WBAT in CAM boot  and wean to ASO per MD  note. ? ?SUBJECTIVE: Pain mainly when I start walking, but it usually gets better after some steps. Not excruciating but I feel it.  ? ?PAIN:  ?PAIN:  ?Are you having pain? Yes ?NPRS scale: 5/10 ?Pain location: Lt foot  ?Pain orientation: Left  ?PAIN TYPE: aching and tight ?Pain description: intermittent  ?Aggravating factors: standing, walking ?Relieving factors: rest, ice, ibuprofen  ? ? ?OBJECTIVE:  ?  ?DIAGNOSTIC FINDINGS: 10/17/21: As of the left ankle obtained in 3 projections.  The previously identified  ?medial and lateral malleolar fractures are unchanged in position and  ?appear to be healing with opacification of the fracture lines and some  ?callus.  There is widening of the medial ankle mortise but no change from  ?prior films.  Approximately 1 to 2 mm of widening ?  ?PATIENT SURVEYS:  ?FOTO 70 (goal is 30) ?  ?COGNITION: ?          Overall cognitive status: Within functional limits for tasks assessed               ?           ?SENSATION: ?WFL ?  ?POSTURE:  ?WFLs  ?  ?PALPATION: ?Edema in Lt foot dorsum, reduced metatarsal mobility, palpable tenderness over bil ankle at malleolus. ?  ?LE ROM: ?  ?Active ROM Right ?10/23/2021 Left ?10/23/2021 Left ?10/28/21  ?  Hip flexion       ?Hip extension       ?Hip abduction       ?Hip adduction       ?Hip internal rotation       ?Hip external rotation       ?Knee flexion       ?Knee extension       ?Ankle dorsiflexion FULL 0   ?Ankle plantarflexion FULL full   ?Ankle inversion FULL Limited by 25%   ?Ankle eversion FULL Limited by 50% Limited less than 25%, eyeballs equal to RT  ? (Blank rows = not tested) ?  ?LE MMT: ?  ?MMT Right ?10/23/2021 Left ?10/23/2021  ?Hip flexion      ?Hip extension      ?Hip abduction      ?Hip adduction      ?Hip internal rotation      ?Hip external rotation      ?Knee flexion      ?Knee extension      ?Ankle dorsiflexion   4/5  ?Ankle plantarflexion   NT  ?Ankle inversion   4/5  ?Ankle eversion   4-/5  ? (Blank rows = not tested) ?   ?GAIT: ?Distance walked: 30 ?Assistive device utilized: Crutches Tried walker and improved patter with rolling walker ?Level of assistance: SBA ?Comments: WBAT, antalgic and moderate UE support on walker  ?Steps: step-to with max UE support ?  ?TODAY'S TREATMENT: ?11/08/21: ?Nustep L1 x 8 min with PTA present to discuss status. ?BAPS L2 3 ways 20x each: VC for control. TC for EV/Inv to initiate correct motion. ?Seated towel on the floor AROM for ankle Inv/Ev 10x each way.  ?Toe extension stretching on yoga block ?Standing rocker board 2 min: TC at pelvis to stabilize ?Gait: With straight cane: VC for heel strike more consistently and push off the great toe mound.  ?Manual: Toe PROM/stretching for extension, gastroc soft tissue work ? ? ?11/06/21 ?Rocker board standing: x2 min ?Baps board: level 2- 4 ways x 1.5 min each- PT provided min assist to control the board.  ?NuStep: level 5x 8 minutes-PT present to discuss progress.  ?Weight shifting on balance pad: 3 ways x1 min each ?Standing hip abduction and extension: 2x10 bil each ?Gait with cane on Rt: level surface- verbal cues for hip alignment and symmetry ?Game Ready-not performed due to no edema today ? ?10/30/21 ?Seated rocker board x 5 min with concurrent discussion of status. ?Baps board: level 2- 4 ways x 1.5 min each- PT provided min assist to control the board.  ?NuStep: level 5x 8 minutes-PT present to discuss progress.  ?LAQ 3# 2x10 ?Red band ankle DF/PF/inversion/eversion x10 with red band ?Manual: PROM , calcaneal mobs,  gastroc and tibialis anterior,  soft tissue mobilization, retrograde massage ?Gait with ASO-  ?Game Ready-not performed due to no edema today ? ?10/28/21 ?Seated rocker board x 5 min with concurrent discussion of status. ?LAQ 3# 2x10 ?Red band ankle DF/PF 2x10 ?Manual resisted ev/Inv 10x each ?Manual: PROM all lanes ankle and toes, gastroc soft tissue mobilization, retrograde massage ?Game Ready x 15 min LT ankle 3 flakes medium  compression ? ?  ?PATIENT EDUCATION:  ?Education details: Access Code: [email or web address] ?Person educated: Patient ?Education method: Explanation, Demonstration, and Handouts ?Education comprehension: verbalized understanding and returned demonstration ?  ?  ?HOME EXERCISE PROGRAM: ?Access Code: [email or web address] ?URL: [email or web address] ?Date: 10/23/2021 ?Prepared by: Claiborne Billings ?  ?Exercises ?Side to Side Weight Shift with Unilateral Counter Support -  3 x daily - 7 x weekly - 2 sets - 10 reps ?Stride Stance Weight Shift - 3 x daily - 7 x weekly - 2 sets - 10 reps ?Long Sitting Calf Stretch with Strap - 2 x daily - 7 x weekly - 10 reps - 10 hold ?Supine Ankle Inversion Eversion AROM - 4 x daily - 7 x weekly - 20 reps ?Supine Ankle Circles - 4 x daily - 7 x weekly - 20 reps ?Supine Ankle Dorsiflexion and Plantarflexion AROM - 4 x daily - 7 x weekly - 3 sets - 10 reps ?Seated Heel Toe Raises - 3 x daily - 7 x weekly - 2 sets - 10 reps ?Seated Long Arc Quad - 3 x daily - 7 x weekly - 2 sets - 10 reps ?Seated March - 3 x daily - 7 x weekly - 2 sets - 10 reps ?  ?  ?ASSESSMENT: ?  ?CLINICAL IMPRESSION: Pt requires assisatnce for active inversion and eversion on the BAPS board, pt could not initiate the correct movement. Pt was ambulating without a proper heel strike as well as walking on the outside portion of her foot. Verbal cuing and practice improved her gait. No pain during session. ?  ?  ?OBJECTIVE IMPAIRMENTS Abnormal gait, decreased activity tolerance, decreased balance, decreased coordination, decreased endurance, difficulty walking, decreased ROM, decreased strength, impaired flexibility, and pain.  ?  ?ACTIVITY LIMITATIONS community activity, meal prep, and shopping.  ?  ?PERSONAL FACTORS Age and 1 comorbidity: Lt ankle fracture   are also affecting patient's functional outcome.  ?  ?  ?REHAB POTENTIAL: Excellent ?  ?CLINICAL DECISION MAKING: Stable/uncomplicated ?  ?EVALUATION COMPLEXITY: Low ? ?GOALS: ?Goals  reviewed with patient? Yes ?  ?SHORT TERM GOALS: Target date: 11/20/21 ?  ?Be independent in initial HEP ?Baseline:  ?Goal status: Goal met 10/25/21 ?  ?2.  Ambulate with CAM boot or ASO with FWB with least restrictive device for all

## 2021-11-11 ENCOUNTER — Encounter: Payer: Self-pay | Admitting: Physical Therapy

## 2021-11-11 ENCOUNTER — Ambulatory Visit: Payer: Medicare Other | Attending: Physician Assistant | Admitting: Physical Therapy

## 2021-11-11 DIAGNOSIS — M25572 Pain in left ankle and joints of left foot: Secondary | ICD-10-CM | POA: Diagnosis not present

## 2021-11-11 DIAGNOSIS — R2689 Other abnormalities of gait and mobility: Secondary | ICD-10-CM | POA: Insufficient documentation

## 2021-11-11 DIAGNOSIS — M6281 Muscle weakness (generalized): Secondary | ICD-10-CM | POA: Insufficient documentation

## 2021-11-11 NOTE — Therapy (Signed)
?OUTPATIENT PHYSICAL THERAPY TREATMENT NOTE ? ? ?Patient Name: Andrea Kelley ?MRN: [id number] ?DOB:1941-02-10, 81 y.o., female ?Today's Date: 11/11/2021 ? ?PCP: [place], Betty G, MD ?REFERRING PROVIDER: Persons, Bevely Palmer, [place] ? ? PT End of Session - 11/11/21 1528   ? ? Visit Number 7   ? Date for PT Re-Evaluation 12/18/21   ? Authorization Type BCBS Medicare   ? Progress Note Due on Visit 10   ? PT Start Time 1528   ? PT Stop Time 1615   ? PT Time Calculation (min) 47 min   ? Activity Tolerance Patient tolerated treatment well   ? Behavior During Therapy [facility] for tasks assessed/performed   ? ?  ?  ? ?  ? ? ? ? ? ?Past Medical History:  ?Diagnosis Date  ? DDD (degenerative disc disease), lumbar   ? Hyperlipidemia   ? Osteopenia   ? [place]  ? Syncope   ? ?Past Surgical History:  ?Procedure Laterality Date  ? ABDOMINAL HYSTERECTOMY    ? For dysfunctional bleeding; no BSO  ? BACK SURGERY  1970  ? ruptured disc ; Dr Quentin Cornwall  ? COLONOSCOPY  2001  ? Negative; done for rectal bleeding  ? LOOP RECORDER INSERTION N/A 02/16/2017  ? Procedure: Loop Recorder Insertion;  Surgeon: Sanda Klein, MD;  Location: [facility];  Service: Cardiovascular;  Laterality: N/A;  ? ?Patient Active Problem List  ? Diagnosis Date Noted  ? Bimalleolar fracture of left ankle 09/05/2021  ? Routine general medical examination at a health care facility 05/27/2018  ? Essential (primary) hypertension 05/20/2017  ? DDD (degenerative disc disease), lumbar 01/16/2011  ? RESTLESS LEG SYNDROME 06/07/2009  ? Hyperglycemia 06/07/2009  ? Hyperlipidemia 07/19/2007  ? ? ?REFERRING DIAG: Closed bimalleolar fracture of the Lt ankle, initial encounter ? ?THERAPY DIAG:  ?Pain in left ankle and joints of left foot ? ?Other abnormalities of gait and mobility ? ?Muscle weakness (generalized) ? ?PERTINENT HISTORY: Osteopenia, lumbar DDD, Lt ankle fracture 08/31/21 due to fall down the steps ? ?PRECAUTIONS: Pt is now WBAT in CAM boot  and wean to ASO per MD  note. ? ?SUBJECTIVE: Doing a lot of walking with and without the cane. Pt does not use walker anymore. Pt now driving independently. Foot swollen from wearing shoes and increased walking. Stretching my toes is hard.  ? ?PAIN:  ?Are you having pain? No ?NPRS scale: 0/10 ?Pain location: Lt foot  ?Pain orientation: Left  ?PAIN TYPE: aching and tight ?Pain description: intermittent  ?Aggravating factors: standing, walking ?Relieving factors: rest, ice, ibuprofen  ? ? ?OBJECTIVE:  ?  ?DIAGNOSTIC FINDINGS: 10/17/21: As of the left ankle obtained in 3 projections.  The previously identified  ?medial and lateral malleolar fractures are unchanged in position and  ?appear to be healing with opacification of the fracture lines and some  ?callus.  There is widening of the medial ankle mortise but no change from  ?prior films.  Approximately 1 to 2 mm of widening ?  ?PATIENT SURVEYS:  ?FOTO 32 (goal is 103) ?  ?COGNITION: ?          Overall cognitive status: Within functional limits for tasks assessed               ?           ?SENSATION: ?WFL ?  ?POSTURE:  ?WFLs  ?  ?PALPATION: ?Edema in Lt foot dorsum, reduced metatarsal mobility, palpable tenderness over bil ankle at malleolus. ?  ?  LE ROM: ?  ?Active ROM Right ?10/23/2021 Left ?10/23/2021 Left ?10/28/21  ?Hip flexion       ?Hip extension       ?Hip abduction       ?Hip adduction       ?Hip internal rotation       ?Hip external rotation       ?Knee flexion       ?Knee extension       ?Ankle dorsiflexion FULL 0   ?Ankle plantarflexion FULL full   ?Ankle inversion FULL Limited by 25%   ?Ankle eversion FULL Limited by 50% Limited less than 25%, eyeballs equal to RT  ? (Blank rows = not tested) ?  ?LE MMT: ?  ?MMT Right ?10/23/2021 Left ?10/23/2021  ?Hip flexion      ?Hip extension      ?Hip abduction      ?Hip adduction      ?Hip internal rotation      ?Hip external rotation      ?Knee flexion      ?Knee extension      ?Ankle dorsiflexion   4/5  ?Ankle plantarflexion   NT  ?Ankle  inversion   4/5  ?Ankle eversion   4-/5  ? (Blank rows = not tested) ?  ?GAIT: ?Distance walked: 30 ?Assistive device utilized: Crutches Tried walker and improved patter with rolling walker ?Level of assistance: SBA ?Comments: WBAT, antalgic and moderate UE support on walker  ?Steps: step-to with max UE support ?  ?TODAY'S TREATMENT: ?  ?11/11/21: ?Nustep L1 x 9 min with PTA present to discuss status. ?Standing heel raises Bil 2x10 ?Standing hip abd Bil 2x10: VC for correct plane ?Seated ankle Ev/INv with towel 10x each: TC/demo for correct motion ?Single limb balance on teal pod 3x 10 sec. Light touch ?Game ready with LE elevated 15 min medium/3 flakes  ? ?11/08/21: ?Nustep L1 x 8 min with PTA present to discuss status. ?BAPS L2 3 ways 20x each: VC for control. TC for EV/Inv to initiate correct motion. ?Seated towel on the floor AROM for ankle Inv/Ev 10x each way.  ?Toe extension stretching on yoga block ?Standing rocker board 2 min: TC at pelvis to stabilize ?Gait: With straight cane: VC for heel strike more consistently and push off the great toe mound.  ?Manual: Toe PROM/stretching for extension, gastroc soft tissue work ? ? ?11/06/21 ?Rocker board standing: x2 min ?Baps board: level 2- 4 ways x 1.5 min each- PT provided min assist to control the board.  ?NuStep: level 5x 8 minutes-PT present to discuss progress.  ?Weight shifting on balance pad: 3 ways x1 min each ?Standing hip abduction and extension: 2x10 bil each ?Gait with cane on Rt: level surface- verbal cues for hip alignment and symmetry ?Game Ready-not performed due to no edema today ? ?10/30/21 ?Seated rocker board x 5 min with concurrent discussion of status. ?Baps board: level 2- 4 ways x 1.5 min each- PT provided min assist to control the board.  ?NuStep: level 5x 8 minutes-PT present to discuss progress.  ?LAQ 3# 2x10 ?Red band ankle DF/PF/inversion/eversion x10 with red band ?Manual: PROM , calcaneal mobs,  gastroc and tibialis anterior,  soft tissue  mobilization, retrograde massage ?Gait with ASO-  ?Game Ready-not performed due to no edema today ? ?10/28/21 ?Seated rocker board x 5 min with concurrent discussion of status. ?LAQ 3# 2x10 ?Red band ankle DF/PF 2x10 ?Manual resisted ev/Inv 10x each ?Manual: PROM all lanes ankle and toes,  gastroc soft tissue mobilization, retrograde massage ?Game Ready x 15 min LT ankle 3 flakes medium compression ? ?  ?PATIENT EDUCATION:  ?Education details: Access Code: [email or web address] ?Person educated: Patient ?Education method: Explanation, Demonstration, and Handouts ?Education comprehension: verbalized understanding and returned demonstration ?  ?  ?HOME EXERCISE PROGRAM: ?Access Code: [email or web address] ?URL: [email or web address] ?Date: 10/23/2021 ?Prepared by: Claiborne Billings ?  ?Exercises ?Side to Side Weight Shift with Unilateral Counter Support - 3 x daily - 7 x weekly - 2 sets - 10 reps ?Stride Stance Weight Shift - 3 x daily - 7 x weekly - 2 sets - 10 reps ?Long Sitting Calf Stretch with Strap - 2 x daily - 7 x weekly - 10 reps - 10 hold ?Supine Ankle Inversion Eversion AROM - 4 x daily - 7 x weekly - 20 reps ?Supine Ankle Circles - 4 x daily - 7 x weekly - 20 reps ?Supine Ankle Dorsiflexion and Plantarflexion AROM - 4 x daily - 7 x weekly - 3 sets - 10 reps ?Seated Heel Toe Raises - 3 x daily - 7 x weekly - 2 sets - 10 reps ?Seated Long Arc Quad - 3 x daily - 7 x weekly - 2 sets - 10 reps ?Seated March - 3 x daily - 7 x weekly - 2 sets - 10 reps ?  ?  ?ASSESSMENT: ?  ?CLINICAL IMPRESSION: Pt is now in a shoe and performing community ambulation with cane, home ambulation without device. Pt independently driving. Pt presents today with increased swelling in her foot. Game ready was helpful. Gait technique improving, pt tends to externally rotate at her hip and ankle and requires VC to correct.   ? ?OBJECTIVE IMPAIRMENTS Abnormal gait, decreased activity tolerance, decreased balance, decreased coordination, decreased endurance,  difficulty walking, decreased ROM, decreased strength, impaired flexibility, and pain.  ?  ?ACTIVITY LIMITATIONS community activity, meal prep, and shopping.  ?  ?PERSONAL FACTORS Age and 1 comorbidity: Lt ankle fra

## 2021-11-13 ENCOUNTER — Ambulatory Visit: Payer: Medicare Other

## 2021-11-13 DIAGNOSIS — M25572 Pain in left ankle and joints of left foot: Secondary | ICD-10-CM | POA: Diagnosis not present

## 2021-11-13 DIAGNOSIS — M6281 Muscle weakness (generalized): Secondary | ICD-10-CM

## 2021-11-13 DIAGNOSIS — R2689 Other abnormalities of gait and mobility: Secondary | ICD-10-CM

## 2021-11-13 NOTE — Therapy (Signed)
?OUTPATIENT PHYSICAL THERAPY TREATMENT NOTE ? ? ?Patient Name: Andrea Kelley ?MRN: [id number] ?DOB:08/04/1941, 80 y.o., female ?Today's Date: 11/13/2021 ? ?PCP: [place], Betty G, MD ?REFERRING PROVIDER: [place], Betty G, MD ? ? PT End of Session - 11/13/21 1540   ? ? Visit Number 8   ? Date for PT Re-Evaluation 12/18/21   ? Authorization Type BCBS Medicare   ? Progress Note Due on Visit 10   ? PT Start Time [phone]   ? PT Stop Time 0941   ? PT Time Calculation (min) 52 min   ? Activity Tolerance Patient tolerated treatment well   ? Behavior During Therapy [facility] for tasks assessed/performed   ? ?  ?  ? ?  ? ? ? ? ? ? ?Past Medical History:  ?Diagnosis Date  ? DDD (degenerative disc disease), lumbar   ? Hyperlipidemia   ? Osteopenia   ? [place]  ? Syncope   ? ?Past Surgical History:  ?Procedure Laterality Date  ? ABDOMINAL HYSTERECTOMY    ? For dysfunctional bleeding; no BSO  ? BACK SURGERY  1970  ? ruptured disc ; Dr Quentin Cornwall  ? COLONOSCOPY  2001  ? Negative; done for rectal bleeding  ? LOOP RECORDER INSERTION N/A 02/16/2017  ? Procedure: Loop Recorder Insertion;  Surgeon: Sanda Klein, MD;  Location: [facility];  Service: Cardiovascular;  Laterality: N/A;  ? ?Patient Active Problem List  ? Diagnosis Date Noted  ? Bimalleolar fracture of left ankle 09/05/2021  ? Routine general medical examination at a health care facility 05/27/2018  ? Essential (primary) hypertension 05/20/2017  ? DDD (degenerative disc disease), lumbar 01/16/2011  ? RESTLESS LEG SYNDROME 06/07/2009  ? Hyperglycemia 06/07/2009  ? Hyperlipidemia 07/19/2007  ? ? ?REFERRING DIAG: Closed bimalleolar fracture of the Lt ankle, initial encounter ? ?THERAPY DIAG:  ?Pain in left ankle and joints of left foot ? ?Other abnormalities of gait and mobility ? ?Muscle weakness (generalized) ? ?PERTINENT HISTORY: Osteopenia, lumbar DDD, Lt ankle fracture 08/31/21 due to fall down the steps ? ?PRECAUTIONS: Pt is now WBAT in CAM boot  and wean to ASO per MD  note. ? ?SUBJECTIVE: I'm walking with a cane most of the time now.  Pain and swelling are variable.    ? ?PAIN:  ?Are you having pain? No ?NPRS scale: 1/10 ?Pain location: Lt foot  ?Pain orientation: Left  ?PAIN TYPE: aching and tight ?Pain description: intermittent  ?Aggravating factors: standing, walking ?Relieving factors: rest, ice, ibuprofen  ? ? ?OBJECTIVE:  ?  ?DIAGNOSTIC FINDINGS: 10/17/21: As of the left ankle obtained in 3 projections.  The previously identified  ?medial and lateral malleolar fractures are unchanged in position and  ?appear to be healing with opacification of the fracture lines and some  ?callus.  There is widening of the medial ankle mortise but no change from  ?prior films.  Approximately 1 to 2 mm of widening ?  ?PATIENT SURVEYS:  ?FOTO 43 (goal is 50) ?  ?COGNITION: ?          Overall cognitive status: Within functional limits for tasks assessed               ?           ?SENSATION: ?WFL ?  ?POSTURE:  ?WFLs  ?  ?PALPATION: ?Edema in Lt foot dorsum, reduced metatarsal mobility, palpable tenderness over bil ankle at malleolus. ?  ?LE ROM: ?  ?Active ROM Right ?10/23/2021 Left ?10/23/2021 Left ?10/28/21  ?Hip  flexion       ?Hip extension       ?Hip abduction       ?Hip adduction       ?Hip internal rotation       ?Hip external rotation       ?Knee flexion       ?Knee extension       ?Ankle dorsiflexion FULL 0   ?Ankle plantarflexion FULL full   ?Ankle inversion FULL Limited by 25%   ?Ankle eversion FULL Limited by 50% Limited less than 25%, eyeballs equal to RT  ? (Blank rows = not tested) ?  ?LE MMT: ?  ?MMT Right ?10/23/2021 Left ?10/23/2021  ?Hip flexion      ?Hip extension      ?Hip abduction      ?Hip adduction      ?Hip internal rotation      ?Hip external rotation      ?Knee flexion      ?Knee extension      ?Ankle dorsiflexion   4/5  ?Ankle plantarflexion   NT  ?Ankle inversion   4/5  ?Ankle eversion   4-/5  ? (Blank rows = not tested) ?  ?GAIT: ?Distance walked: 30 ?Assistive device  utilized: Crutches Tried walker and improved patter with rolling walker ?Level of assistance: SBA ?Comments: WBAT, antalgic and moderate UE support on walker  ?Steps: step-to with max UE support ?  ?TODAY'S TREATMENT: ?11/13/21: ?Nustep L3 x 9  min with PT present to discuss status. ?BAPS L2 3 ways x1 min each: VC for control. Assist by PT for control of board  ?Standing heel raises Bil 2x10 ?Standing hip abduction and extension on balance pad: 2x10 ?Rockerboard: 36min in standing ?Rt foot tap on low cone to promote weightbearing on Lt LE- min UE assist on bar for support.  2x10 ?Gait with cane: up and down hallway-verbal cues for Lt hip alignment, symmetry and step length.  ?Game ready with LE elevated 15 min medium/3 flakes  ? ?11/11/21: ?Nustep L1 x 9 min with PTA present to discuss status. ?Standing heel raises Bil 2x10 ?Standing hip abd Bil 2x10: VC for correct plane ?Seated ankle Ev/INv with towel 10x each: TC/demo for correct motion ?Single limb balance on teal pod 3x 10 sec. Light touch ?Game ready with LE elevated 15 min medium/3 flakes  ? ?11/08/21: ?Nustep L1 x 8 min with PTA present to discuss status. ?BAPS L2 3 ways 20x each: VC for control. TC for EV/Inv to initiate correct motion. ?Seated towel on the floor AROM for ankle Inv/Ev 10x each way.  ?Toe extension stretching on yoga block ?Standing rocker board 2 min: TC at pelvis to stabilize ?Gait: With straight cane: VC for heel strike more consistently and push off the great toe mound.  ?Manual: Toe PROM/stretching for extension, gastroc soft tissue work ? ?  ?PATIENT EDUCATION:  ?Education details: Access Code: [email or web address] ?Person educated: Patient ?Education method: Explanation, Demonstration, and Handouts ?Education comprehension: verbalized understanding and returned demonstration ?  ?  ?HOME EXERCISE PROGRAM: ?Access Code: [email or web address] ?URL: [email or web address] ?Date: 10/23/2021 ?Prepared by: Claiborne Billings ?  ?Exercises ?Side to Side Weight Shift with  Unilateral Counter Support - 3 x daily - 7 x weekly - 2 sets - 10 reps ?Stride Stance Weight Shift - 3 x daily - 7 x weekly - 2 sets - 10 reps ?Long Sitting Calf Stretch with Strap - 2 x daily - 7 x weekly - 10 reps - 10 hold ?Supine  Ankle Inversion Eversion AROM - 4 x daily - 7 x weekly - 20 reps ?Supine Ankle Circles - 4 x daily - 7 x weekly - 20 reps ?Supine Ankle Dorsiflexion and Plantarflexion AROM - 4 x daily - 7 x weekly - 3 sets - 10 reps ?Seated Heel Toe Raises - 3 x daily - 7 x weekly - 2 sets - 10 reps ?Seated Long Arc Quad - 3 x daily - 7 x weekly - 2 sets - 10 reps ?Seated March - 3 x daily - 7 x weekly - 2 sets - 10 reps ?  ?  ?ASSESSMENT: ?  ?CLINICAL IMPRESSION: Pt is now in a shoe and performing community ambulation with cane, home ambulation without device. Pt with antalgia and PT worked on gait pattern today to improve technique. Pt is able to walk and stand for 45 min to 1 hour for home and community tasks.  Edema is variable based on level of activity. Pt will continue to benefit from skilled PT to address strength, flexibility and gait.   ? ?OBJECTIVE IMPAIRMENTS Abnormal gait, decreased activity tolerance, decreased balance, decreased coordination, decreased endurance, difficulty walking, decreased ROM, decreased strength, impaired flexibility, and pain.  ?  ?ACTIVITY LIMITATIONS community activity, meal prep, and shopping.  ?  ?PERSONAL FACTORS Age and 1 comorbidity: Lt ankle fracture   are also affecting patient's functional outcome.  ?  ?  ?REHAB POTENTIAL: Excellent ?  ?CLINICAL DECISION MAKING: Stable/uncomplicated ?  ?EVALUATION COMPLEXITY: Low ? ?GOALS: ?Goals reviewed with patient? Yes ?  ?SHORT TERM GOALS: Target date: 11/20/21 ?  ?Be independent in initial HEP ?Baseline:  ?Goal status: Goal met 10/25/21 ?  ?2.  Ambulate with CAM boot or ASO with FWB with least restrictive device for all distances  ?Baseline: wearing ASO, 50% of the time, weightbearing FWB (11/06/21) ?Goal status: Goal  met 11/11/21 ?  ?3.  Negotiate steps with step-to gait and use of 1 rail with independence for increased ease with getting in/out of the house.   ?Baseline: step-to with 1 rail, moderate support  ?Goal status:

## 2021-11-17 NOTE — Therapy (Addendum)
?OUTPATIENT PHYSICAL THERAPY TREATMENT NOTE ? ? ?Patient Name: Andrea Kelley ?MRN: [id number] ?DOB:08-08-1941, 81 y.o., female ?Today's Date: 11/18/2021 ? ?PCP: Martinique, Betty G, MD ?REFERRING PROVIDER: Martinique, Betty G, MD ? ? PT End of Session - 11/18/21 0754   ? ? Visit Number 9   ? Date for PT Re-Evaluation 12/18/21   ? Authorization Type BCBS Medicare   ? PT Start Time [phone]   ? PT Stop Time 0850   ? PT Time Calculation (min) 56 min   ? Activity Tolerance Patient tolerated treatment well   ? Behavior During Therapy [facility] for tasks assessed/performed   ? ?  ?  ? ?  ? ? ? ? ? ? ? ?Past Medical History:  ?Diagnosis Date  ? DDD (degenerative disc disease), lumbar   ? Hyperlipidemia   ? Osteopenia   ? [place] [facility]  ? Syncope   ? ?Past Surgical History:  ?Procedure Laterality Date  ? ABDOMINAL HYSTERECTOMY    ? For dysfunctional bleeding; no BSO  ? BACK SURGERY  1970  ? ruptured disc ; Dr Quentin Cornwall  ? COLONOSCOPY  2001  ? Negative; done for rectal bleeding  ? LOOP RECORDER INSERTION N/A 02/16/2017  ? Procedure: Loop Recorder Insertion;  Surgeon: Sanda Klein, MD;  Location: [facility];  Service: Cardiovascular;  Laterality: N/A;  ? ?Patient Active Problem List  ? Diagnosis Date Noted  ? Bimalleolar fracture of left ankle 09/05/2021  ? Routine general medical examination at a health care facility 05/27/2018  ? Essential (primary) hypertension 05/20/2017  ? DDD (degenerative disc disease), lumbar 01/16/2011  ? RESTLESS LEG SYNDROME 06/07/2009  ? Hyperglycemia 06/07/2009  ? Hyperlipidemia 07/19/2007  ? ? ?REFERRING DIAG: Closed bimalleolar fracture of the Lt ankle, initial encounter ? ?THERAPY DIAG:  ?Pain in left ankle and joints of left foot ? ?Other abnormalities of gait and mobility ? ?Muscle weakness (generalized) ? ?PERTINENT HISTORY: Osteopenia, lumbar DDD, Lt ankle fracture 08/31/21 due to fall down the steps ? ?PRECAUTIONS: Pt is now WBAT in CAM boot  and wean to ASO per MD note. ? ?SUBJECTIVE: I am going up  the stairs with 1 foot on stair now, 2 going down. I am still not walking right yet. ? ?PAIN:  ?Are you having pain? No ?NPRS scale: 1/10 ?Pain location: Lt foot  ?Pain orientation: Left  ?PAIN TYPE: aching and tight ?Pain description: intermittent  ?Aggravating factors: standing, walking ?Relieving factors: rest, ice, ibuprofen  ? ? ?OBJECTIVE:  ?  ?DIAGNOSTIC FINDINGS: 10/17/21: As of the left ankle obtained in 3 projections.  The previously identified  ?medial and lateral malleolar fractures are unchanged in position and  ?appear to be healing with opacification of the fracture lines and some  ?callus.  There is widening of the medial ankle mortise but no change from  ?prior films.  Approximately 1 to 2 mm of widening ?  ?PATIENT SURVEYS:  ?FOTO 14 (goal is 53) ?  ?COGNITION: ?          Overall cognitive status: Within functional limits for tasks assessed               ?           ?SENSATION: ?WFL ?  ?POSTURE:  ?WFLs  ?  ?PALPATION: ?Edema in Lt foot dorsum, reduced metatarsal mobility, palpable tenderness over bil ankle at malleolus. ?  ?LE ROM: ?  ?Active ROM Right ?10/23/2021 Left ?10/23/2021 Left ?10/28/21  ?Hip flexion       ?  Hip extension       ?Hip abduction       ?Hip adduction       ?Hip internal rotation       ?Hip external rotation       ?Knee flexion       ?Knee extension       ?Ankle dorsiflexion FULL 0   ?Ankle plantarflexion FULL full   ?Ankle inversion FULL Limited by 25%   ?Ankle eversion FULL Limited by 50% Limited less than 25%, eyeballs equal to RT  ? (Blank rows = not tested) ?  ?LE MMT: ?  ?MMT Right ?10/23/2021 Left ?10/23/2021  ?Hip flexion      ?Hip extension      ?Hip abduction      ?Hip adduction      ?Hip internal rotation      ?Hip external rotation      ?Knee flexion      ?Knee extension      ?Ankle dorsiflexion   4/5  ?Ankle plantarflexion   NT  ?Ankle inversion   4/5  ?Ankle eversion   4-/5  ? (Blank rows = not tested) ?  ?GAIT: ?Distance walked: 30 ?Assistive device utilized: Crutches  Tried walker and improved patter with rolling walker ?Level of assistance: SBA ?Comments: WBAT, antalgic and moderate UE support on walker  ?Steps: step-to with max UE support ?  ?TODAY'S TREATMENT: ? 11/18/21: ?Nustep L4 x 9  min with PTA present to discuss status. ?Standing heel raises Bil 3x10 ?Rockerboard: 39min in standing ?2# ankles for: hip 3 ways 15x each: VC for LT hip abduction correction. ?Rt foot tap on low cone to promote weightbearing on Lt LE- min UE assist on bar for support. 2x10 front tap then 3x10 side tap: VC to contract Lt glute ?LT LAQ with ball squeeze 3# 3x10 ?Supine bridge 10x: added to HEP to increase gluteal awareness ?Game ready with LE elevated 15 min medium/3 flakes  ? ?11/13/21: ?Nustep L3 x 9  min with PT present to discuss status. ?BAPS L2 3 ways x1 min each: VC for control. Assist by PT for control of board  ?Standing heel raises Bil 2x10 ?Standing hip abduction and extension on balance pad: 2x10 ?Rockerboard: 56min in standing ?Rt foot tap on low cone to promote weightbearing on Lt LE- min UE assist on bar for support.  2x10 ?Gait with cane: up and down hallway-verbal cues for Lt hip alignment, symmetry and step length.  ?Game ready with LE elevated 15 min medium/3 flakes  ? ?11/11/21: ?Nustep L1 x 9 min with PTA present to discuss status. ?Standing heel raises Bil 2x10 ?Standing hip abd Bil 2x10: VC for correct plane ?Seated ankle Ev/INv with towel 10x each: TC/demo for correct motion ?Single limb balance on teal pod 3x 10 sec. Light touch ?Game ready with LE elevated 15 min medium/3 flakes  ? ?11/08/21: ?Nustep L1 x 8 min with PTA present to discuss status. ?BAPS L2 3 ways 20x each: VC for control. TC for EV/Inv to initiate correct motion. ?Seated towel on the floor AROM for ankle Inv/Ev 10x each way.  ?Toe extension stretching on yoga block ?Standing rocker board 2 min: TC at pelvis to stabilize ?Gait: With straight cane: VC for heel strike more consistently and push off the great toe  mound.  ?Manual: Toe PROM/stretching for extension, gastroc soft tissue work ? ?  ?PATIENT EDUCATION:  ?Education details: Access Code: [email or web address] ?Person educated: Patient ?Education method: Explanation, Demonstration, and Handouts ?  Education comprehension: verbalized understanding and returned demonstration ?  ?  ?HOME EXERCISE PROGRAM: ?Access Code: [email or web address] ?URL: [email or web address] ?Date: 10/23/2021 ?Prepared by: Claiborne Billings ?  ?Exercises ?Side to Side Weight Shift with Unilateral Counter Support - 3 x daily - 7 x weekly - 2 sets - 10 reps ?Stride Stance Weight Shift - 3 x daily - 7 x weekly - 2 sets - 10 reps ?Long Sitting Calf Stretch with Strap - 2 x daily - 7 x weekly - 10 reps - 10 hold ?Supine Ankle Inversion Eversion AROM - 4 x daily - 7 x weekly - 20 reps ?Supine Ankle Circles - 4 x daily - 7 x weekly - 20 reps ?Supine Ankle Dorsiflexion and Plantarflexion AROM - 4 x daily - 7 x weekly - 3 sets - 10 reps ?Seated Heel Toe Raises - 3 x daily - 7 x weekly - 2 sets - 10 reps ?Seated Long Arc Quad - 3 x daily - 7 x weekly - 2 sets - 10 reps ?Seated March - 3 x daily - 7 x weekly - 2 sets - 10 reps ?  ?  ?ASSESSMENT: ?  ?CLINICAL IMPRESSION: Pt arrives pain free to Pt this AM. Pt is wearing a compression sleeve to her knee and pt reports this has greatly helped her edema. Pt has also been able to consistently go up the steps reciprocally but not going down yet. Pt requires verbal cuing and demonstration with Lt hip abduction to do correctly.  ? ?OBJECTIVE IMPAIRMENTS Abnormal gait, decreased activity tolerance, decreased balance, decreased coordination, decreased endurance, difficulty walking, decreased ROM, decreased strength, impaired flexibility, and pain.  ?  ?ACTIVITY LIMITATIONS community activity, meal prep, and shopping.  ?  ?PERSONAL FACTORS Age and 1 comorbidity: Lt ankle fracture   are also affecting patient's functional outcome.  ?  ?  ?REHAB POTENTIAL: Excellent ?  ?CLINICAL DECISION  MAKING: Stable/uncomplicated ?  ?EVALUATION COMPLEXITY: Low ? ?GOALS: ?Goals reviewed with patient? Yes ?  ?SHORT TERM GOALS: Target date: 11/20/21 ?  ?Be independent in initial HEP ?Baseline:  ?Goal status:

## 2021-11-18 ENCOUNTER — Ambulatory Visit: Payer: Medicare Other | Admitting: Physical Therapy

## 2021-11-18 ENCOUNTER — Encounter: Payer: Self-pay | Admitting: Physical Therapy

## 2021-11-18 DIAGNOSIS — R2689 Other abnormalities of gait and mobility: Secondary | ICD-10-CM | POA: Diagnosis not present

## 2021-11-18 DIAGNOSIS — M6281 Muscle weakness (generalized): Secondary | ICD-10-CM

## 2021-11-18 DIAGNOSIS — M25572 Pain in left ankle and joints of left foot: Secondary | ICD-10-CM | POA: Diagnosis not present

## 2021-11-20 ENCOUNTER — Ambulatory Visit: Payer: Medicare Other

## 2021-11-20 ENCOUNTER — Other Ambulatory Visit (INDEPENDENT_AMBULATORY_CARE_PROVIDER_SITE_OTHER): Payer: Medicare Other

## 2021-11-20 DIAGNOSIS — E782 Mixed hyperlipidemia: Secondary | ICD-10-CM | POA: Diagnosis not present

## 2021-11-20 DIAGNOSIS — M25572 Pain in left ankle and joints of left foot: Secondary | ICD-10-CM | POA: Diagnosis not present

## 2021-11-20 DIAGNOSIS — R2689 Other abnormalities of gait and mobility: Secondary | ICD-10-CM

## 2021-11-20 DIAGNOSIS — M6281 Muscle weakness (generalized): Secondary | ICD-10-CM

## 2021-11-20 LAB — LIPID PANEL
Cholesterol: 135 mg/dL (ref 0–200)
HDL: 43.9 mg/dL (ref >39.00)
LDL Cholesterol: 59 mg/dL (ref 0–99)
NonHDL: 90.75
Total CHOL/HDL Ratio: 3
Triglycerides: 160 mg/dL — ABNORMAL HIGH (ref 0.0–149.0)
VLDL: 32 mg/dL (ref 0.0–40.0)

## 2021-11-20 NOTE — Therapy (Signed)
?OUTPATIENT PHYSICAL THERAPY TREATMENT NOTE ? ? ?Patient Name: Andrea Kelley ?MRN: [id number] ?DOB:[birth year], 81 y.o., female ?Today's Date: 11/20/2021 ? ?PCP: [place], Betty G, MD ?REFERRING PROVIDER: Persons, [place], [place] ?Progress Note ?Reporting Period 10/23/21 to 11/20/21 ? ?See note below for Objective Data and Assessment of Progress/Goals.  ? ?  ? PT End of Session - 11/20/21 0841   ? ? Visit Number 10   ? Date for PT Re-Evaluation 12/18/21   ? Authorization Type BCBS Medicare   ? Progress Note Due on Visit 20   ? PT Start Time [id number]   ? PT Stop Time 0841   ? PT Time Calculation (min) 46 min   ? Activity Tolerance Patient tolerated treatment well   ? [facility] for tasks assessed/performed   ? ?  ?  ? ?  ? ? ? ? ? ? ? ? ?Past Medical History:  ?Diagnosis Date  ? DDD (degenerative disc disease), lumbar   ? Hyperlipidemia   ? Osteopenia   ? [place] [facility]  ? Syncope   ? ?Past Surgical History:  ?Procedure Laterality Date  ? ABDOMINAL HYSTERECTOMY    ? For dysfunctional bleeding; no BSO  ? BACK SURGERY  1970  ? ruptured disc ; Dr Roxan Hockeyobinson  ? COLONOSCOPY  2001  ? Negative; done for rectal bleeding  ? LOOP RECORDER INSERTION N/A 02/16/2017  ? Procedure: Loop Recorder Insertion;  Surgeon: Thurmon Fairroitoru, Mihai, MD;  Location: [facility];  Service: Cardiovascular;  Laterality: N/A;  ? ?Patient Active Problem List  ? Diagnosis Date Noted  ? Bimalleolar fracture of left ankle 09/05/2021  ? Routine general medical examination at a health care facility 05/27/2018  ? Essential (primary) hypertension 05/20/2017  ? DDD (degenerative disc disease), lumbar 01/16/2011  ? RESTLESS LEG SYNDROME 06/07/2009  ? Hyperglycemia 06/07/2009  ? Hyperlipidemia 07/19/2007  ? ? ?REFERRING DIAG: Closed bimalleolar fracture of the Lt ankle, initial encounter ? ?THERAPY DIAG:  ?Pain in left ankle and joints of left foot ? ?Other abnormalities of gait and mobility ? ?Muscle weakness (generalized) ? ?PERTINENT HISTORY: Osteopenia,  lumbar DDD, Lt ankle fracture 08/31/21 due to fall down the steps ? ?PRECAUTIONS: Pt is now WBAT in CAM boot  and wean to ASO per MD note. ? ?SUBJECTIVE: Im wearing compression stocking on the Lt.  I am still working on coming down the steps with step-over-step gait. ? ?PAIN:  ?Are you having pain? No ?NPRS scale: 1/10 ?Pain location: Lt foot  ?Pain orientation: Left  ?PAIN TYPE: aching and tight ?Pain description: intermittent  ?Aggravating factors: standing, walking ?Relieving factors: rest, ice, ibuprofen  ? ? ?OBJECTIVE:  ?  ?DIAGNOSTIC FINDINGS: 10/17/21: As of the left ankle obtained in 3 projections.  The previously identified  ?medial and lateral malleolar fractures are unchanged in position and  ?appear to be healing with opacification of the fracture lines and some  ?callus.  There is widening of the medial ankle mortise but no change from  ?prior films.  Approximately 1 to 2 mm of widening ?  ?PATIENT SURVEYS:  ?FOTO 32 (goal is 59)-evaluation ?FOTO: 68 (11/20/21) ?  ?COGNITION: ?          Overall cognitive status: Within functional limits for tasks assessed               ?           ?SENSATION: ?WFL ?  ?POSTURE:  ?WFLs  ?  ?PALPATION: ?Edema in Lt foot  dorsum, reduced metatarsal mobility, palpable tenderness over bil ankle at malleolus. ?  ?LE ROM: ?  ?Active ROM Right ?10/23/2021 Left ?10/23/2021 Left ?10/28/21  ?Hip flexion       ?Hip extension       ?Hip abduction       ?Hip adduction       ?Hip internal rotation       ?Hip external rotation       ?Knee flexion       ?Knee extension       ?Ankle dorsiflexion FULL 0   ?Ankle plantarflexion FULL full   ?Ankle inversion FULL Limited by 25%   ?Ankle eversion FULL Limited by 50% Limited less than 25%, eyeballs equal to RT  ? (Blank rows = not tested) ?  ?LE MMT: ?  ?MMT Right ?10/23/2021 Left ?10/23/2021 Left  ?11/20/21  ?Hip flexion       ?Hip extension       ?Hip abduction       ?Hip adduction       ?Hip internal rotation       ?Hip external rotation       ?Knee  flexion       ?Knee extension       ?Ankle dorsiflexion   4/5 4+/5  ?Ankle plantarflexion   NT 4-/5  ?Ankle inversion   4/5 4+/5  ?Ankle eversion   4-/5 4+/5  ? (Blank rows = not tested) ?  ?GAIT: 11/20/21 ?Distance walked: 100 ?Assistive device utilized: cane on Rt ?Level of assistance: SBA ?Comments: minimal antalgia  ?Steps: alternating pattern ascending and step-to descending with use of 1 rail ?   ? ?TODAY'S TREATMENT: ?11/20/21: ?Nustep L4 x 8 min with PT present to discuss progress ?BAPS L2 3 ways x1 min each: VC for control. Assist by PT for control of board  ?Standing heel raises Bil 2x10-Lt>Rt weightbearing with min UE support ?2# ankles for: hip abduction and abduction 2x10: VC for LT hip abduction correction. ?Alternating taps on 6" step to promote Lt LE stability and balance 2x20 reps ?Low hurdles: step over forward and sideways  ?6" step downs on Lt 2x10 ? 11/18/21: ?Nustep L4 x 9  min with PTA present to discuss status. ?Standing heel raises Bil 3x10 ?Rockerboard: in standing ?2# ankles for: hip 3 ways 15x each: VC for LT hip abduction correction. ?Rt foot tap on low cone to promote weightbearing on Lt LE- min UE assist on bar for support. 2x10 front tap then 3x10 side tap: VC to contract Lt glute ?LT LAQ with ball squeeze 3# 3x10 ?Supine bridge 10x: added to HEP to increase gluteal awareness ?Game ready with LE elevated 15 min medium/3 flakes  ? ?11/13/21: ?Nustep L3 x 9  min with PT present to discuss status. ?BAPS L2 3 ways x1 min each: VC for control. Assist by PT for control of board  ?Standing heel raises Bil 2x10 ?Standing hip abduction and extension on balance pad: 2x10 ?Rockerboard: in standing ?Rt foot tap on low cone to promote weightbearing on Lt LE- min UE assist on bar for support.  2x10 ?Gait with cane: up and down hallway-verbal cues for Lt hip alignment, symmetry and step length.  ?Game ready with LE elevated 15 min medium/3 flakes  ? ? ?PATIENT EDUCATION:  ?Education details:  Access Code: [email or web address] ?Person educated: Patient ?Education method: Explanation, Demonstration, and Handouts ?Education comprehension: verbalized understanding and returned demonstration ?  ?  ?HOME EXERCISE PROGRAM: ?Access Code: [email or web address] ?  URL: [email or web address] ?Date: 10/23/2021 ?Prepared by: Tresa Endo ?  ?Exercises ?Side to Side Weight Shift with Unilateral Counter Support - 3 x daily - 7 x weekly - 2 sets - 10 reps ?Stride Stance Weight Shift - 3 x daily - 7 x weekly - 2 sets - 10 reps ?Long Sitting Calf Stretch with Strap - 2 x daily - 7 x weekly - 10 reps - 10 hold ?Supine Ankle Inversion Eversion AROM - 4 x daily - 7 x weekly - 20 reps ?Supine Ankle Circles - 4 x daily - 7 x weekly - 20 reps ?Supine Ankle Dorsiflexion and Plantarflexion AROM - 4 x daily - 7 x weekly - 3 sets - 10 reps ?Seated Heel Toe Raises - 3 x daily - 7 x weekly - 2 sets - 10 reps ?Seated Long Arc Quad - 3 x daily - 7 x weekly - 2 sets - 10 reps ?Seated March - 3 x daily - 7 x weekly - 2 sets - 10 reps ?  ?  ?ASSESSMENT: ?  ?CLINICAL IMPRESSION: Pt continues to make progress s/p Lt ankle fracture.  Pt is now using standard cane with all gait and is able to ascend steps with alternating pattern and is descending with step-to due to limited Lt ankle A/ROM and functional strength.  Strength is improved yet continues to be limited for functional tasks. Pt requires tactile and verbal cues throughout session for technique.  Patient will benefit from skilled PT to address the below impairments and improve overall function.  ? ?OBJECTIVE IMPAIRMENTS Abnormal gait, decreased activity tolerance, decreased balance, decreased coordination, decreased endurance, difficulty walking, decreased ROM, decreased strength, impaired flexibility, and pain.  ?  ?ACTIVITY LIMITATIONS community activity, meal prep, and shopping.  ?  ?PERSONAL FACTORS Age and 1 comorbidity: Lt ankle fracture   are also affecting patient's functional outcome.  ?  ?   ?REHAB POTENTIAL: Excellent ?  ?CLINICAL DECISION MAKING: Stable/uncomplicated ?  ?EVALUATION COMPLEXITY: Low ? ?GOALS: ?Goals reviewed with patient? Yes ?  ?SHORT TERM GOALS: Target date: 11/20/21 ?  ?Be i

## 2021-11-21 ENCOUNTER — Ambulatory Visit: Payer: Medicare Other | Admitting: Orthopaedic Surgery

## 2021-11-21 ENCOUNTER — Encounter: Payer: Self-pay | Admitting: Orthopaedic Surgery

## 2021-11-21 DIAGNOSIS — S82842D Displaced bimalleolar fracture of left lower leg, subsequent encounter for closed fracture with routine healing: Secondary | ICD-10-CM

## 2021-11-21 NOTE — Progress Notes (Signed)
? ?Office Visit Note ?  ?Patient: Andrea Kelley           ?Date of Birth: June 26, 1941           ?MRN: [id number] ?Visit Date: 11/21/2021 ?             ?Requested by: Martinique, Betty G, MD ?[facility] ?[place],  [place] [address] ?PCP: Martinique, Betty G, MD ? ? ?Assessment & Plan: ?Visit Diagnoses:  ?1. Closed bimalleolar fracture of left ankle with routine healing, subsequent encounter   ? ? ?Plan: Ms. Ellingson is nearly 3 months status post minimally displaced bimalleolar fracture left ankle.  She is doing very well.  She is complete her course of physical therapy tomorrow.  She no longer uses the equalizer boot or the ankle support but does wear compressive sock as she does have some swelling about the ankle and the foot as the day progresses.  No calf pain.  Neurologically she remains intact.  She is got excellent range of motion and no local tenderness.  I think she is done exceptionally well and we will plan to see her back as needed.  Should continue with her home exercises.  She is aware that she could develop some posttraumatic arthritis over time but hopefully her symptoms will be minimal.  She will let me know if she needs anything over time.  Prior x-rays demonstrated a callus about the bimalleolar fracture ? ?Follow-Up Instructions: Return if symptoms worsen or fail to improve.  ? ?Orders:  ?No orders of the defined types were placed in this encounter. ? ?No orders of the defined types were placed in this encounter. ? ? ? ? Procedures: ?No procedures performed ? ? ?Clinical Data: ?No additional findings. ? ? ?Subjective: ?Chief Complaint  ?Patient presents with  ? Left Ankle - Follow-up  ?  Bimalleolar ankle fracture  ?Patient presents today for a 5 week follow up on her left ankle. She is almost 3 months out from a left bimalleolar ankle fracture. She states that she is doing much better. She is walking with a cane today. She has been wearing an ASO brace when she goes out. She is almost finished  with physical therapy.  ? ?HPI ? ?Review of Systems ? ? ?Objective: ?Vital Signs: There were no vitals taken for this visit. ? ?Physical Exam ? ?Ortho Exam left ankle with some mild soft tissue swelling as well as some in the dorsum of her foot.  Neurologically intact.  Excellent range of motion actively with probably 30 degrees of plantarflexion and probably 20 to 25 degrees of dorsiflexion.  This is not functionally limiting.  No instability.  There were no localized areas of tenderness about the medial lateral malleolar line and no obvious deformity.  Sensation intact ? ?Specialty Comments:  ?No specialty comments available. ? ?Imaging: ?No results found. ? ? ?PMFS History: ?Patient Active Problem List  ? Diagnosis Date Noted  ? Bimalleolar fracture of left ankle 09/05/2021  ? Routine general medical examination at a health care facility 05/27/2018  ? Essential (primary) hypertension 05/20/2017  ? DDD (degenerative disc disease), lumbar 01/16/2011  ? RESTLESS LEG SYNDROME 06/07/2009  ? Hyperglycemia 06/07/2009  ? Hyperlipidemia 07/19/2007  ? ?Past Medical History:  ?Diagnosis Date  ? DDD (degenerative disc disease), lumbar   ? Hyperlipidemia   ? Osteopenia   ? [place] [facility]  ? Syncope   ?  ?Family History  ?Problem Relation Age of Onset  ? Lung cancer Father   ?  smoker  ? Rheum arthritis Father   ? Heart attack Mother   ?     88s  ? Stroke Mother   ?     in 34s  ? Breast cancer Maternal Aunt   ? Diabetes Neg Hx   ?  ?Past Surgical History:  ?Procedure Laterality Date  ? ABDOMINAL HYSTERECTOMY    ? For dysfunctional bleeding; no BSO  ? BACK SURGERY  1970  ? ruptured disc ; Dr Quentin Cornwall  ? COLONOSCOPY  2001  ? Negative; done for rectal bleeding  ? LOOP RECORDER INSERTION N/A 02/16/2017  ? Procedure: Loop Recorder Insertion;  Surgeon: Sanda Klein, MD;  Location: [facility];  Service: Cardiovascular;  Laterality: N/A;  ? ?Social History  ? ?Occupational History  ? 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 Smoking status: Never  ? Smokeless tobacco: Never  ?Substance and Sexual Activity  ? Alcohol use: Yes  ?  Alcohol/week: 7.0 standard drinks  ?  Types: 7 Glasses of wine per week  ? Drug use: No  ? Sexual activity: Not on file  ? ? ? ? ? ? ?

## 2021-11-26 ENCOUNTER — Ambulatory Visit: Payer: Medicare Other

## 2021-11-26 DIAGNOSIS — M6281 Muscle weakness (generalized): Secondary | ICD-10-CM

## 2021-11-26 DIAGNOSIS — M25572 Pain in left ankle and joints of left foot: Secondary | ICD-10-CM

## 2021-11-26 DIAGNOSIS — R2689 Other abnormalities of gait and mobility: Secondary | ICD-10-CM | POA: Diagnosis not present

## 2021-11-26 NOTE — Therapy (Signed)
?OUTPATIENT PHYSICAL THERAPY TREATMENT NOTE ? ? ?Patient Name: Andrea Kelley ?MRN: [id number] ?DOB:1941/05/12, 81 y.o., female ?Today's Date: 11/26/2021 ? ?PCP: Martinique, Betty G, MD ?REFERRING PROVIDER: Persons, Bevely Palmer, [place] ? ? ?  ? PT End of Session - 11/26/21 1127   ? ? Visit Number 11   ? Date for PT Re-Evaluation 12/18/21   ? Authorization Type BCBS Medicare   ? Progress Note Due on Visit 20   ? PT Start Time 1017   ? PT Stop Time 1058   ? PT Time Calculation (min) 41 min   ? Activity Tolerance Patient tolerated treatment well   ? Behavior During Therapy [facility] for tasks assessed/performed   ? ?  ?  ? ?  ? ? ? ? ? ? ? ? ? ?Past Medical History:  ?Diagnosis Date  ? DDD (degenerative disc disease), lumbar   ? Hyperlipidemia   ? Osteopenia   ? [place] [facility]  ? Syncope   ? ?Past Surgical History:  ?Procedure Laterality Date  ? ABDOMINAL HYSTERECTOMY    ? For dysfunctional bleeding; no BSO  ? BACK SURGERY  1970  ? ruptured disc ; Dr Quentin Cornwall  ? COLONOSCOPY  2001  ? Negative; done for rectal bleeding  ? LOOP RECORDER INSERTION N/A 02/16/2017  ? Procedure: Loop Recorder Insertion;  Surgeon: Sanda Klein, MD;  Location: [place] [facility];  Service: Cardiovascular;  Laterality: N/A;  ? ?Patient Active Problem List  ? Diagnosis Date Noted  ? Bimalleolar fracture of left ankle 09/05/2021  ? Routine general medical examination at a health care facility 05/27/2018  ? Essential (primary) hypertension 05/20/2017  ? DDD (degenerative disc disease), lumbar 01/16/2011  ? RESTLESS LEG SYNDROME 06/07/2009  ? Hyperglycemia 06/07/2009  ? Hyperlipidemia 07/19/2007  ? ? ?REFERRING DIAG: Closed bimalleolar fracture of the Lt ankle, initial encounter ? ?THERAPY DIAG:  ?Pain in left ankle and joints of left foot ? ?Other abnormalities of gait and mobility ? ?Muscle weakness (generalized) ? ?PERTINENT HISTORY: Osteopenia, lumbar DDD, Lt ankle fracture 08/31/21 due to fall down the steps ? ?PRECAUTIONS: Pt is now WBAT in CAM boot  and wean  to ASO per MD note. ? ?SUBJECTIVE: I saw the MD and he released me.  I'm not wearing the brace as much because it limits my ROM when i'm walking.   ? ?PAIN:  ?Are you having pain? No ?NPRS scale: 0/10 ?Pain location: Lt foot  ?Pain orientation: Left  ?PAIN TYPE: aching and tight ?Pain description: intermittent  ?Aggravating factors: standing, walking ?Relieving factors: rest, ice, ibuprofen  ? ? ?OBJECTIVE:  ?  ?DIAGNOSTIC FINDINGS: 10/17/21: As of the left ankle obtained in 3 projections.  The previously identified  ?medial and lateral malleolar fractures are unchanged in position and  ?appear to be healing with opacification of the fracture lines and some  ?callus.  There is widening of the medial ankle mortise but no change from  ?prior films.  Approximately 1 to 2 mm of widening ?  ?PATIENT SURVEYS:  ?FOTO 32 (goal is 59)-evaluation ?FOTO: 68 (11/20/21) ?  ?COGNITION: ?          Overall cognitive status: Within functional limits for tasks assessed               ?           ?SENSATION: ?WFL ?  ?POSTURE:  ?WFLs  ?  ?PALPATION: ?Edema in Lt foot dorsum, reduced metatarsal mobility, palpable tenderness over bil ankle at malleolus. ?  ?  LE ROM: ?  ?Active ROM Right ?10/23/2021 Left ?10/23/2021 Left ?10/28/21  ?Hip flexion       ?Hip extension       ?Hip abduction       ?Hip adduction       ?Hip internal rotation       ?Hip external rotation       ?Knee flexion       ?Knee extension       ?Ankle dorsiflexion FULL 0   ?Ankle plantarflexion FULL full   ?Ankle inversion FULL Limited by 25%   ?Ankle eversion FULL Limited by 50% Limited less than 25%, eyeballs equal to RT  ? (Blank rows = not tested) ?  ?LE MMT: ?  ?MMT Right ?10/23/2021 Left ?10/23/2021 Left  ?11/20/21  ?Hip flexion       ?Hip extension       ?Hip abduction       ?Hip adduction       ?Hip internal rotation       ?Hip external rotation       ?Knee flexion       ?Knee extension       ?Ankle dorsiflexion   4/5 4+/5  ?Ankle plantarflexion   NT 4-/5  ?Ankle inversion    4/5 4+/5  ?Ankle eversion   4-/5 4+/5  ? (Blank rows = not tested) ?  ?GAIT: 11/20/21 ?Distance walked: 100 ?Assistive device utilized: cane on Rt ?Level of assistance: SBA ?Comments: minimal antalgia  ?Steps: alternating pattern ascending and step-to descending with use of 1 rail ?   ? ?TODAY'S TREATMENT: ?11/26/21: ?Nustep L4 x 8 min with PT present to discuss progress ?Rockerboard x 3 minutes  ?BAPS L2 3 ways x1.5 min each: VC for control. Assist by PT for control of board  ?Standing heel raises Bil 2x10-Lt>Rt weightbearing with min UE support ?Alternating taps on 6" step to promote Lt LE stability and balance 2x20 reps ?Low hurdles: step over forward and sideways  ?4" step downs on Lt 2x10 without brace- verbal cues for control ?Side step-ups on 6" step 2x10 ?Box stepping: both directions x 10 each  ?11/20/21: ?Nustep L4 x 8 min with PT present to discuss progress ?BAPS L2 3 ways x1 min each: VC for control. Assist by PT for control of board  ?Standing heel raises Bil 2x10-Lt>Rt weightbearing with min UE support ?2# ankles for: hip abduction and abduction 2x10: VC for LT hip abduction correction. ?Alternating taps on 6" step to promote Lt LE stability and balance 2x20 reps ?Low hurdles: step over forward and sideways  ?6" step downs on Lt 2x10 ? 11/18/21: ?Nustep L4 x 9  min with PTA present to discuss status. ?Standing heel raises Bil 3x10 ?Rockerboard: 51min in standing ?2# ankles for: hip 3 ways 15x each: VC for LT hip abduction correction. ?Rt foot tap on low cone to promote weightbearing on Lt LE- min UE assist on bar for support. 2x10 front tap then 3x10 side tap: VC to contract Lt glute ?LT LAQ with ball squeeze 3# 3x10 ?Supine bridge 10x: added to HEP to increase gluteal awareness ?Game ready with LE elevated 15 min medium/3 flakes  ? ?PATIENT EDUCATION:  ?Education details: Access Code: [email or web address] ?Person educated: Patient ?Education method: Explanation, Demonstration, and Handouts ?Education comprehension:  verbalized understanding and returned demonstration ?  ?  ?HOME EXERCISE PROGRAM: ?Access Code: [email or web address] ?URL: [email or web address] ?Date: 10/23/2021 ?Prepared by: Claiborne Billings ?  ?Exercises ?Side to Side Weight Shift with Unilateral Counter Support -  3 x daily - 7 x weekly - 2 sets - 10 reps ?Stride Stance Weight Shift - 3 x daily - 7 x weekly - 2 sets - 10 reps ?Long Sitting Calf Stretch with Strap - 2 x daily - 7 x weekly - 10 reps - 10 hold ?Supine Ankle Inversion Eversion AROM - 4 x daily - 7 x weekly - 20 reps ?Supine Ankle Circles - 4 x daily - 7 x weekly - 20 reps ?Supine Ankle Dorsiflexion and Plantarflexion AROM - 4 x daily - 7 x weekly - 3 sets - 10 reps ?Seated Heel Toe Raises - 3 x daily - 7 x weekly - 2 sets - 10 reps ?Seated Long Arc Quad - 3 x daily - 7 x weekly - 2 sets - 10 reps ?Seated March - 3 x daily - 7 x weekly - 2 sets - 10 reps ?  ?  ?ASSESSMENT: ?  ?CLINICAL IMPRESSION: Pt continues to make progress s/p Lt ankle fracture.  Pt is now using standard cane with all gait and is able to ascend steps with alternating pattern and is descending with step-to due to limited Lt ankle A/ROM and functional strength.  Pt did well with balance tasks and required min UE support as needed.  Pt was challenged with 4" step downs without brace today and required increased reps to improve technique.  Pt requires tactile and verbal cues throughout session for technique.  Patient will benefit from skilled PT to address the below impairments and improve overall function.  ? ?OBJECTIVE IMPAIRMENTS Abnormal gait, decreased activity tolerance, decreased balance, decreased coordination, decreased endurance, difficulty walking, decreased ROM, decreased strength, impaired flexibility, and pain.  ?  ?ACTIVITY LIMITATIONS community activity, meal prep, and shopping.  ?  ?PERSONAL FACTORS Age and 1 comorbidity: Lt ankle fracture   are also affecting patient's functional outcome.  ?  ?  ?REHAB POTENTIAL: Excellent ?   ?CLINICAL DECISION MAKING: Stable/uncomplicated ?  ?EVALUATION COMPLEXITY: Low ? ?GOALS: ?Goals reviewed with patient? Yes ?  ?SHORT TERM GOALS: Target date: 11/20/21 ?  ?Be independent in initial HEP ?

## 2021-11-28 ENCOUNTER — Ambulatory Visit: Payer: Medicare Other

## 2021-11-28 DIAGNOSIS — M25572 Pain in left ankle and joints of left foot: Secondary | ICD-10-CM

## 2021-11-28 DIAGNOSIS — R2689 Other abnormalities of gait and mobility: Secondary | ICD-10-CM | POA: Diagnosis not present

## 2021-11-28 DIAGNOSIS — M6281 Muscle weakness (generalized): Secondary | ICD-10-CM

## 2021-11-28 NOTE — Therapy (Signed)
?OUTPATIENT PHYSICAL THERAPY TREATMENT NOTE ? ? ?Patient Name: Andrea Kelley ?MRN: [id number] ?DOB:1941-05-24, 81 y.o., female ?Today's Date: 11/28/2021 ? ?PCP: Martinique, Betty G, MD ?REFERRING PROVIDER: Persons, Bevely Palmer, [place] ? ? ?  ? PT End of Session - 11/28/21 0843   ? ? Visit Number 12   ? Date for PT Re-Evaluation 12/18/21   ? Authorization Type BCBS Medicare   ? Progress Note Due on Visit 20   ? PT Start Time 0802   ? PT Stop Time 0844   ? PT Time Calculation (min) 42 min   ? Activity Tolerance Patient tolerated treatment well   ? Behavior During Therapy [facility] for tasks assessed/performed   ? ?  ?  ? ?  ? ? ? ? ? ? ? ? ? ? ?Past Medical History:  ?Diagnosis Date  ? DDD (degenerative disc disease), lumbar   ? Hyperlipidemia   ? Osteopenia   ? [place] [facility]  ? Syncope   ? ?Past Surgical History:  ?Procedure Laterality Date  ? ABDOMINAL HYSTERECTOMY    ? For dysfunctional bleeding; no BSO  ? BACK SURGERY  1970  ? ruptured disc ; Dr Quentin Cornwall  ? COLONOSCOPY  2001  ? Negative; done for rectal bleeding  ? LOOP RECORDER INSERTION N/A 02/16/2017  ? Procedure: Loop Recorder Insertion;  Surgeon: Sanda Klein, MD;  Location: [place] [facility];  Service: Cardiovascular;  Laterality: N/A;  ? ?Patient Active Problem List  ? Diagnosis Date Noted  ? Bimalleolar fracture of left ankle 09/05/2021  ? Routine general medical examination at a health care facility 05/27/2018  ? Essential (primary) hypertension 05/20/2017  ? DDD (degenerative disc disease), lumbar 01/16/2011  ? RESTLESS LEG SYNDROME 06/07/2009  ? Hyperglycemia 06/07/2009  ? Hyperlipidemia 07/19/2007  ? ? ?REFERRING DIAG: Closed bimalleolar fracture of the Lt ankle, initial encounter ? ?THERAPY DIAG:  ?Pain in left ankle and joints of left foot ? ?Muscle weakness (generalized) ? ?Other abnormalities of gait and mobility ? ?PERTINENT HISTORY: Osteopenia, lumbar DDD, Lt ankle fracture 08/31/21 due to fall down the steps ? ?PRECAUTIONS: Pt is now WBAT in CAM boot  and wean  to ASO per MD note. ? ?SUBJECTIVE: I saw the MD and he released me.  I'm not wearing the brace as much because it limits my ROM when i'm walking.   ? ?PAIN:  ?Are you having pain? No ?NPRS scale: 0/10 ?Pain location: Lt foot  ?Pain orientation: Left  ?PAIN TYPE: aching and tight ?Pain description: intermittent  ?Aggravating factors: standing, walking ?Relieving factors: rest, ice, ibuprofen  ? ? ?OBJECTIVE:  ?  ?DIAGNOSTIC FINDINGS: 10/17/21: As of the left ankle obtained in 3 projections.  The previously identified  ?medial and lateral malleolar fractures are unchanged in position and  ?appear to be healing with opacification of the fracture lines and some  ?callus.  There is widening of the medial ankle mortise but no change from  ?prior films.  Approximately 1 to 2 mm of widening ?  ?PATIENT SURVEYS:  ?FOTO 32 (goal is 59)-evaluation ?FOTO: 68 (11/20/21) ?  ?COGNITION: ?          Overall cognitive status: Within functional limits for tasks assessed               ?           ?SENSATION: ?WFL ?  ?POSTURE:  ?WFLs  ?  ?PALPATION: ?Edema in Lt foot dorsum, reduced metatarsal mobility, palpable tenderness over bil ankle at  malleolus. ?  ?LE ROM: ?  ?Active ROM Right ?10/23/2021 Left ?10/23/2021 Left ?10/28/21  ?Hip flexion       ?Hip extension       ?Hip abduction       ?Hip adduction       ?Hip internal rotation       ?Hip external rotation       ?Knee flexion       ?Knee extension       ?Ankle dorsiflexion FULL 0   ?Ankle plantarflexion FULL full   ?Ankle inversion FULL Limited by 25%   ?Ankle eversion FULL Limited by 50% Limited less than 25%, eyeballs equal to RT  ? (Blank rows = not tested) ?  ?LE MMT: ?  ?MMT Right ?10/23/2021 Left ?10/23/2021 Left  ?11/20/21  ?Hip flexion       ?Hip extension       ?Hip abduction       ?Hip adduction       ?Hip internal rotation       ?Hip external rotation       ?Knee flexion       ?Knee extension       ?Ankle dorsiflexion   4/5 4+/5  ?Ankle plantarflexion   NT 4-/5  ?Ankle inversion    4/5 4+/5  ?Ankle eversion   4-/5 4+/5  ? (Blank rows = not tested) ?  ?GAIT: 11/20/21 ?Distance walked: 100 ?Assistive device utilized: cane on Rt ?Level of assistance: SBA ?Comments: minimal antalgia  ?Steps: alternating pattern ascending and step-to descending with use of 1 rail ?   ? ?TODAY'S TREATMENT: ?11/28/21: ?Nustep L4 x 10 min with PT present to discuss progress ?Rockerboard x 3 minutes  ?BAPS L2 3 ways x1.5 min each: VC for control. Assist by PT for control of board  ?Low hurdles: step over forward and sideways  ?2" step downs on Lt 2x10 without brace- verbal cues for control and alignment of Lt foot ?Taps on low cone on the floor- alternating feet x20  ?Box stepping: both directions x 10 each  ?Resisted walking: 10# holding handles ?11/26/21: ?Nustep L4 x 8 min with PT present to discuss progress ?Rockerboard x 3 minutes  ?BAPS L2 3 ways x1.5 min each: VC for control. Assist by PT for control of board  ?Standing heel raises Bil 2x10-Lt>Rt weightbearing with min UE support ?Alternating taps on 6" step to promote Lt LE stability and balance 2x20 reps ?Low hurdles: step over forward and sideways  ?4" step downs on Lt 2x10 without brace- verbal cues for control ?Side step-ups on 6" step 2x10 ?Box stepping: both directions x 10 each  ?11/20/21: ?Nustep L4 x 8 min with PT present to discuss progress ?BAPS L2 3 ways x1 min each: VC for control. Assist by PT for control of board  ?Standing heel raises Bil 2x10-Lt>Rt weightbearing with min UE support ?2# ankles for: hip abduction and abduction 2x10: VC for LT hip abduction correction. ?Alternating taps on 6" step to promote Lt LE stability and balance 2x20 reps ?Low hurdles: step over forward and sideways  ?6" step downs on Lt 2x10 ?  ? ?PATIENT EDUCATION:  ?Education details: Access Code: [email or web address] ?Person educated: Patient ?Education method: Explanation, Demonstration, and Handouts ?Education comprehension: verbalized understanding and returned demonstration ?  ?   ?HOME EXERCISE PROGRAM: ?Access Code: [email or web address] ?URL: [email or web address] ?Date: 10/23/2021 ?Prepared by: Claiborne Billings ?  ?Exercises ?Side to Side Weight Shift with Unilateral Counter Support - 3 x daily - 7  x weekly - 2 sets - 10 reps ?Stride Stance Weight Shift - 3 x daily - 7 x weekly - 2 sets - 10 reps ?Long Sitting Calf Stretch with Strap - 2 x daily - 7 x weekly - 10 reps - 10 hold ?Supine Ankle Inversion Eversion AROM - 4 x daily - 7 x weekly - 20 reps ?Supine Ankle Circles - 4 x daily - 7 x weekly - 20 reps ?Supine Ankle Dorsiflexion and Plantarflexion AROM - 4 x daily - 7 x weekly - 3 sets - 10 reps ?Seated Heel Toe Raises - 3 x daily - 7 x weekly - 2 sets - 10 reps ?Seated Long Arc Quad - 3 x daily - 7 x weekly - 2 sets - 10 reps ?Seated March - 3 x daily - 7 x weekly - 2 sets - 10 reps ?  ?  ?ASSESSMENT: ?  ?CLINICAL IMPRESSION: Pt continues to make progress s/p Lt ankle fracture.  Pt is now using standard cane with all gait and is able to ascend steps with alternating pattern and is descending with step-to due to limited Lt ankle A/ROM and functional strength. Pt arrived today without cane because she forgot it.  Gait pattern is min antalgic with ER on the Lt.  Pt did well with balance tasks and required min UE support as needed.  Pt did better with 2" step downs without brace today and pt required verbal cues for hip alignment and control.  Pt requires tactile and verbal cues throughout session for technique.  Patient will benefit from skilled PT to address the below impairments and improve overall function.  ? ?OBJECTIVE IMPAIRMENTS Abnormal gait, decreased activity tolerance, decreased balance, decreased coordination, decreased endurance, difficulty walking, decreased ROM, decreased strength, impaired flexibility, and pain.  ?  ?ACTIVITY LIMITATIONS community activity, meal prep, and shopping.  ?  ?PERSONAL FACTORS Age and 1 comorbidity: Lt ankle fracture   are also affecting patient's  functional outcome.  ?  ?  ?REHAB POTENTIAL: Excellent ?  ?CLINICAL DECISION MAKING: Stable/uncomplicated ?  ?EVALUATION COMPLEXITY: Low ? ?GOALS: ?Goals reviewed with patient? Yes ?  ?SHORT TERM GOALS: [place]

## 2021-12-02 ENCOUNTER — Encounter: Payer: Self-pay | Admitting: Physical Therapy

## 2021-12-02 ENCOUNTER — Ambulatory Visit: Payer: Medicare Other | Admitting: Physical Therapy

## 2021-12-02 DIAGNOSIS — M6281 Muscle weakness (generalized): Secondary | ICD-10-CM | POA: Diagnosis not present

## 2021-12-02 DIAGNOSIS — M25572 Pain in left ankle and joints of left foot: Secondary | ICD-10-CM

## 2021-12-02 DIAGNOSIS — R2689 Other abnormalities of gait and mobility: Secondary | ICD-10-CM

## 2021-12-02 NOTE — Therapy (Signed)
?OUTPATIENT PHYSICAL THERAPY TREATMENT NOTE ? ? ?Patient Name: Andrea Kelley ?MRN: [id number] ?DOB:[birth year], 81 y.o., female ?Today's Date: 12/02/2021 ? ?PCP: [place], Betty G, MD ?REFERRING PROVIDER: Persons, [place], PA ? ? ?  ? PT End of Session - 12/02/21 [id number]   ? ? Visit Number 13   ? Date for PT Re-Evaluation 12/18/21   ? Authorization Type BCBS Medicare   ? Progress Note Due on Visit 20   ? PT Start Time [phone]   ? Activity Tolerance Patient tolerated treatment well   ? [facility]/performed   ? ?  ?  ? ?  ? ? ? ? ? ? ? ? ? ? ?Past Medical History:  ?Diagnosis Date  ? DDD (degenerative disc disease), lumbar   ? Hyperlipidemia   ? Osteopenia   ? Andrea Kelley  ? Syncope   ? ?Past Surgical History:  ?Procedure Laterality Date  ? ABDOMINAL HYSTERECTOMY    ? For dysfunctional bleeding; no BSO  ? BACK SURGERY  1970  ? ruptured disc ; Dr Roxan Hockeyobinson  ? COLONOSCOPY  2001  ? Negative; done for rectal bleeding  ? LOOP RECORDER INSERTION N/A 02/16/2017  ? Procedure: Loop Recorder Insertion;  Surgeon: Thurmon Fairroitoru, Mihai, MD;  Location: [facility];  Service: Cardiovascular;  Laterality: N/A;  ? ?Patient Active Problem List  ? Diagnosis Date Noted  ? Bimalleolar fracture of left ankle 09/05/2021  ? Routine general medical examination at a health care facility 05/27/2018  ? Essential (primary) hypertension 05/20/2017  ? DDD (degenerative disc disease), lumbar 01/16/2011  ? RESTLESS LEG SYNDROME 06/07/2009  ? Hyperglycemia 06/07/2009  ? Hyperlipidemia 07/19/2007  ? ? ?REFERRING DIAG: Closed bimalleolar fracture of the Lt ankle, initial encounter ? ?THERAPY DIAG:  ?Pain in left ankle and joints of left foot ? ?Muscle weakness (generalized) ? ?Other abnormalities of gait and mobility ? ?PERTINENT HISTORY: Osteopenia, lumbar DDD, Lt ankle fracture 08/31/21 due to fall down the steps ? ?PRECAUTIONS: Pt is now WBAT in CAM boot  and wean to ASO per MD note. ? ?SUBJECTIVE: I am doing great. June  first is my big trip. ? ?PAIN:  ?Are you having pain? No ?NPRS scale: 0/10 ?Pain location: Lt foot  ?Pain orientation: Left  ?PAIN TYPE: aching and tight ?Pain description: intermittent  ?Aggravating factors: standing, walking ?Relieving factors: rest, ice, ibuprofen  ? ? ?OBJECTIVE:  ?  ?DIAGNOSTIC FINDINGS: 10/17/21: As of the left ankle obtained in 3 projections.  The previously identified  ?medial and lateral malleolar fractures are unchanged in position and  ?appear to be healing with opacification of the fracture lines and some  ?callus.  There is widening of the medial ankle mortise but no change from  ?prior films.  Approximately 1 to 2 mm of widening ?  ?PATIENT SURVEYS:  ?FOTO 32 (goal is 59)-evaluation ?FOTO: 68 (11/20/21) ?  ?COGNITION: ?          Overall cognitive status: Within functional limits for tasks assessed               ?           ?SENSATION: ?WFL ?  ?POSTURE:  ?WFLs  ?  ?PALPATION: ?Edema in Lt foot dorsum, reduced metatarsal mobility, palpable tenderness over bil ankle at malleolus. ?  ?LE ROM: ?  ?Active ROM Right ?10/23/2021 Left ?10/23/2021 Left ?10/28/21  ?Hip flexion       ?Hip extension       ?  Hip abduction       ?Hip adduction       ?Hip internal rotation       ?Hip external rotation       ?Knee flexion       ?Knee extension       ?Ankle dorsiflexion FULL 0   ?Ankle plantarflexion FULL full   ?Ankle inversion FULL Limited by 25%   ?Ankle eversion FULL Limited by 50% Limited less than 25%, eyeballs equal to RT  ? (Blank rows = not tested) ?  ?LE MMT: ?  ?MMT Right ?10/23/2021 Left ?10/23/2021 Left  ?11/20/21  ?Hip flexion       ?Hip extension       ?Hip abduction       ?Hip adduction       ?Hip internal rotation       ?Hip external rotation       ?Knee flexion       ?Knee extension       ?Ankle dorsiflexion   4/5 4+/5  ?Ankle plantarflexion   NT 4-/5  ?Ankle inversion   4/5 4+/5  ?Ankle eversion   4-/5 4+/5  ? (Blank rows = not tested) ?  ?GAIT: 11/20/21 ?Distance walked: 100 ?Assistive device  utilized: cane on Rt ?Level of assistance: SBA ?Comments: minimal antalgia  ?Steps: alternating pattern ascending and step-to descending with use of 1 rail ?   ? ?TODAY'S TREATMENT: ? ?12/02/21: ?Nustep L6 x 10 min with PTA present to discuss progress ?Rockerboard x 3 minutes seated ankle DF/PF VC to slow speed ?Calf stretch on Pro-stretch 3x 30 sec ?6" step ups Lt 2x10, Vc to keep foot straight ?Low hurdles: step over forward and sideways  ?6" step downs on Lt 2x10 without brace- verbal cues for control and alignment of foot Dynamic weight shifting 3 ways 1 min on mini tramp ?Resisted walking: 10# holding handles ?Sit to stand 2x10 from low mat holding 5# KB ?Calf press Bil on leg press 20x 30# ?LT LAQ 4# 2x10 ? ?11/28/21: ?Nustep L4 x 10 min with PT present to discuss progress ?Rockerboard x 3 minutes  ?BAPS L2 3 ways x1.5 min each: VC for control. Assist by PT for control of board  ?Low hurdles: step over forward and sideways  ?2" step downs on Lt 2x10 without brace- verbal cues for control and alignment of Lt foot ?Taps on low cone on the floor- alternating feet x20  ?Box stepping: both directions x 10 each  ?Resisted walking: 10# holding handles ?11/26/21: ?Nustep L4 x 8 min with PT present to discuss progress ?Rockerboard x 3 minutes  ?BAPS L2 3 ways x1.5 min each: VC for control. Assist by PT for control of board  ?Standing heel raises Bil 2x10-Lt>Rt weightbearing with min UE support ?Alternating taps on 6" step to promote Lt LE stability and balance 2x20 reps ?Low hurdles: step over forward and sideways  ?4" step downs on Lt 2x10 without brace- verbal cues for control ?Side step-ups on 6" step 2x10 ?Box stepping: both directions x 10 each  ?11/20/21: ?Nustep L4 x 8 min with PT present to discuss progress ?BAPS L2 3 ways x1 min each: VC for control. Assist by PT for control of board  ?Standing heel raises Bil 2x10-Lt>Rt weightbearing with min UE support ?2# ankles for: hip abduction and abduction 2x10: VC for LT  hip abduction correction. ?Alternating taps on 6" step to promote Lt LE stability and balance 2x20 reps ?Low hurdles: step over forward  and sideways  ?6" step downs on Lt 2x10 ?  ? ?PATIENT EDUCATION:  ?Education details: Access Code: [email or web address] ?Person educated: Patient ?Education method: Explanation, Demonstration, and Handouts ?Education comprehension: verbalized understanding and returned demonstration ?  ?  ?HOME EXERCISE PROGRAM: ?Access Code: [email or web address] ?URL: [email or web address] ?Date: 10/23/2021 ?Prepared by: Tresa Endo ?  ?Exercises ?Side to Side Weight Shift with Unilateral Counter Support - 3 x daily - 7 x weekly - 2 sets - 10 reps ?Stride Stance Weight Shift - 3 x daily - 7 x weekly - 2 sets - 10 reps ?Long Sitting Calf Stretch with Strap - 2 x daily - 7 x weekly - 10 reps - 10 hold ?Supine Ankle Inversion Eversion AROM - 4 x daily - 7 x weekly - 20 reps ?Supine Ankle Circles - 4 x daily - 7 x weekly - 20 reps ?Supine Ankle Dorsiflexion and Plantarflexion AROM - 4 x daily - 7 x weekly - 3 sets - 10 reps ?Seated Heel Toe Raises - 3 x daily - 7 x weekly - 2 sets - 10 reps ?Seated Long Arc Quad - 3 x daily - 7 x weekly - 2 sets - 10 reps ?Seated March - 3 x daily - 7 x weekly - 2 sets - 10 reps ?  ?  ?ASSESSMENT: ?  ?CLINICAL IMPRESSION: Pt continues to make progress s/p Lt ankle fracture. Pt ambulating without any assistive device at this time. She reports going down stairs reciprocally without thinking of it over the weekend. Pt demonstrates tightness in her ankle when descending the normal size stairs today. PTA suggested pt work on this at home, pt agreed. No pain with exercises. Edema well managed.  ? ?OBJECTIVE IMPAIRMENTS Abnormal gait, decreased activity tolerance, decreased balance, decreased coordination, decreased endurance, difficulty walking, decreased ROM, decreased strength, impaired flexibility, and pain.  ?  ?ACTIVITY LIMITATIONS community activity, meal prep, and shopping.  ?   ?PERSONAL FACTORS Age and 1 comorbidity: Lt ankle fracture   are also affecting patient's functional outcome.  ?  ?  ?REHAB POTENTIAL: Excellent ?  ?CLINICAL DECISION MAKING: Stable/uncomplicated ?  ?EVALUA

## 2021-12-05 ENCOUNTER — Ambulatory Visit: Payer: Medicare Other

## 2021-12-05 DIAGNOSIS — M25572 Pain in left ankle and joints of left foot: Secondary | ICD-10-CM

## 2021-12-05 DIAGNOSIS — R2689 Other abnormalities of gait and mobility: Secondary | ICD-10-CM

## 2021-12-05 DIAGNOSIS — M6281 Muscle weakness (generalized): Secondary | ICD-10-CM

## 2021-12-05 NOTE — Therapy (Signed)
?OUTPATIENT PHYSICAL THERAPY TREATMENT NOTE ? ? ?Patient Name: Andrea Kelley ?MRN: [id number] ?DOB:1941-01-24, 81 y.o., female ?Today's Date: 12/05/2021 ? ?PCP: [place], Betty G, MD ?REFERRING PROVIDER: [place], Betty G, MD ? ? ?  ? PT End of Session - 12/05/21 1058   ? ? Visit Number 14   ? Date for PT Re-Evaluation 12/18/21   ? Authorization Type BCBS Medicare   ? Progress Note Due on Visit 20   ? PT Start Time 1017   ? PT Stop Time 1058   ? PT Time Calculation (min) 41 min   ? Activity Tolerance Patient tolerated treatment well   ? Behavior During Therapy [facility] for tasks assessed/performed   ? ?  ?  ? ?  ? ? ? ? ? ? ? ? ? ? ? ?Past Medical History:  ?Diagnosis Date  ? DDD (degenerative disc disease), lumbar   ? Hyperlipidemia   ? Osteopenia   ? Maury Elam  ? Syncope   ? ?Past Surgical History:  ?Procedure Laterality Date  ? ABDOMINAL HYSTERECTOMY    ? For dysfunctional bleeding; no BSO  ? BACK SURGERY  1970  ? ruptured disc ; Dr Quentin Cornwall  ? COLONOSCOPY  2001  ? Negative; done for rectal bleeding  ? LOOP RECORDER INSERTION N/A 02/16/2017  ? Procedure: Loop Recorder Insertion;  Surgeon: Sanda Klein, MD;  Location: [place] [facility];  Service: Cardiovascular;  Laterality: N/A;  ? ?Patient Active Problem List  ? Diagnosis Date Noted  ? Bimalleolar fracture of left ankle 09/05/2021  ? Routine general medical examination at a health care facility 05/27/2018  ? Essential (primary) hypertension 05/20/2017  ? DDD (degenerative disc disease), lumbar 01/16/2011  ? RESTLESS LEG SYNDROME 06/07/2009  ? Hyperglycemia 06/07/2009  ? Hyperlipidemia 07/19/2007  ? ? ?REFERRING DIAG: Closed bimalleolar fracture of the Lt ankle, initial encounter ? ?THERAPY DIAG:  ?Pain in left ankle and joints of left foot ? ?Muscle weakness (generalized) ? ?Other abnormalities of gait and mobility ? ?PERTINENT HISTORY: Osteopenia, lumbar DDD, Lt ankle fracture 08/31/21 due to fall down the steps ? ?PRECAUTIONS: Pt is now WBAT in CAM boot  and wean  to ASO per MD note. ? ?SUBJECTIVE: I am wearing a regular shoe today.  I've been wearing my compression stocking and working on stepping down.  ? ?PAIN:  ?Are you having pain? No ?NPRS scale: 0/10 ?Pain location: Lt foot  ?Pain orientation: Left  ?PAIN TYPE: aching and tight ?Pain description: intermittent  ?Aggravating factors: standing, walking ?Relieving factors: rest, ice, ibuprofen  ? ? ?OBJECTIVE:  ?  ?DIAGNOSTIC FINDINGS: 10/17/21: As of the left ankle obtained in 3 projections.  The previously identified  ?medial and lateral malleolar fractures are unchanged in position and  ?appear to be healing with opacification of the fracture lines and some  ?callus.  There is widening of the medial ankle mortise but no change from  ?prior films.  Approximately 1 to 2 mm of widening ?  ?PATIENT SURVEYS:  ?FOTO 32 (goal is 59)-evaluation ?FOTO: 68 (11/20/21) ?  ?COGNITION: ?          Overall cognitive status: Within functional limits for tasks assessed               ?           ?SENSATION: ?WFL ?  ?POSTURE:  ?WFLs  ?  ?PALPATION: ?Edema in Lt foot dorsum, reduced metatarsal mobility, palpable tenderness over bil ankle at malleolus. ?  ?LE ROM: ?  ?  Active ROM Right ?10/23/2021 Left ?10/23/2021 Left ?10/28/21  ?Hip flexion       ?Hip extension       ?Hip abduction       ?Hip adduction       ?Hip internal rotation       ?Hip external rotation       ?Knee flexion       ?Knee extension       ?Ankle dorsiflexion FULL 0   ?Ankle plantarflexion FULL full   ?Ankle inversion FULL Limited by 25%   ?Ankle eversion FULL Limited by 50% Limited less than 25%, eyeballs equal to RT  ? (Blank rows = not tested) ?  ?LE MMT: ?  ?MMT Right ?10/23/2021 Left ?10/23/2021 Left  ?11/20/21  ?Hip flexion       ?Hip extension       ?Hip abduction       ?Hip adduction       ?Hip internal rotation       ?Hip external rotation       ?Knee flexion       ?Knee extension       ?Ankle dorsiflexion   4/5 4+/5  ?Ankle plantarflexion   NT 4-/5  ?Ankle inversion   4/5  4+/5  ?Ankle eversion   4-/5 4+/5  ? (Blank rows = not tested) ?  ?GAIT: 11/20/21 ?Distance walked: 100 ?Assistive device utilized: cane on Rt ?Level of assistance: SBA ?Comments: minimal antalgia  ?Steps: alternating pattern ascending and step-to descending with use of 1 rail ?   ? ?TODAY'S TREATMENT: ?12/04/21: ?Nustep L6 x 10 min with PTA present to discuss progress ?Resisted walking: 10# forward and reverse, 5# sidestepping x 8 each ?Rockerboard x 3 minutes seated ankle DF/PF VC to slow speed ?6" step ups Lt 2x10, Vc to keep foot straight ?Low hurdles: step over forward and sideways  ?6" step downs on Lt 2x10 without brace- verbal cues for control and alignment of foot Dynamic weight shifting 3 ways 1 min on mini tramp ?Sit to stand 2x10 from low mat holding 5# KB ?LT LAQ 4# 2x10 ? ?12/02/21: ?Nustep L6 x 10 min with PTA present to discuss progress ?Rockerboard x 3 minutes seated ankle DF/PF VC to slow speed ?Calf stretch on Pro-stretch 3x 30 sec ?6" step ups Lt 2x10, Vc to keep foot straight ?Low hurdles: step over forward and sideways  ?6" step downs on Lt 2x10 without brace- verbal cues for control and alignment of foot Dynamic weight shifting 3 ways 1 min on mini tramp ?Resisted walking: 10# holding handles ?Sit to stand 2x10 from low mat holding 5# KB ?Calf press Bil on leg press 20x 30# ?LT LAQ 4# 2x10 ? ?11/28/21: ?Nustep L4 x 10 min with PT present to discuss progress ?Rockerboard x 3 minutes  ?BAPS L2 3 ways x1.5 min each: VC for control. Assist by PT for control of board  ?Low hurdles: step over forward and sideways  ?2" step downs on Lt 2x10 without brace- verbal cues for control and alignment of Lt foot ?Taps on low cone on the floor- alternating feet x20  ?Box stepping: both directions x 10 each  ?Resisted walking: 10# holding handles ? ?  ? ?PATIENT EDUCATION:  ?Education details: Access Code: [email or web address] ?Person educated: Patient ?Education method: Explanation, Demonstration, and Handouts ?Education  comprehension: verbalized understanding and returned demonstration ?  ?  ?HOME EXERCISE PROGRAM: ?Access Code: [email or web address] ?URL: [email or web address] ?Date: 10/23/2021 ?Prepared by: Claiborne Billings ?  ?Exercises ?Side  to Side Weight Shift with Unilateral Counter Support - 3 x daily - 7 x weekly - 2 sets - 10 reps ?Stride Stance Weight Shift - 3 x daily - 7 x weekly - 2 sets - 10 reps ?Long Sitting Calf Stretch with Strap - 2 x daily - 7 x weekly - 10 reps - 10 hold ?Supine Ankle Inversion Eversion AROM - 4 x daily - 7 x weekly - 20 reps ?Supine Ankle Circles - 4 x daily - 7 x weekly - 20 reps ?Supine Ankle Dorsiflexion and Plantarflexion AROM - 4 x daily - 7 x weekly - 3 sets - 10 reps ?Seated Heel Toe Raises - 3 x daily - 7 x weekly - 2 sets - 10 reps ?Seated Long Arc Quad - 3 x daily - 7 x weekly - 2 sets - 10 reps ?Seated March - 3 x daily - 7 x weekly - 2 sets - 10 reps ?  ?  ?ASSESSMENT: ?  ?CLINICAL IMPRESSION: Pt continues to make progress s/p Lt ankle fracture. Pt ambulating without any assistive device at this time. She reports going down stairs reciprocally without thinking of it over the weekend. Pt is working on eccentric control with descending steps.  Pt did well with side stepping with resistance today and required min guard.  Working on high level balance to prepare for her trip in June. Patient will benefit from skilled PT to address the below impairments and improve overall function.  ? ?OBJECTIVE IMPAIRMENTS Abnormal gait, decreased activity tolerance, decreased balance, decreased coordination, decreased endurance, difficulty walking, decreased ROM, decreased strength, impaired flexibility, and pain.  ?  ?ACTIVITY LIMITATIONS community activity, meal prep, and shopping.  ?  ?PERSONAL FACTORS Age and 1 comorbidity: Lt ankle fracture   are also affecting patient's functional outcome.  ?  ?  ?REHAB POTENTIAL: Excellent ?  ?CLINICAL DECISION MAKING: Stable/uncomplicated ?  ?EVALUATION COMPLEXITY:  Low ? ?GOALS: ?Goals reviewed with patient? Yes ?  ?SHORT TERM GOALS: Target date: 11/20/21 ?  ?Be independent in initial HEP ?Baseline:  ?Goal status: Goal met 10/25/21 ?  ?2.  Ambulate with CAM boot or ASO

## 2021-12-09 ENCOUNTER — Encounter: Payer: Medicare Other | Admitting: Physical Therapy

## 2021-12-12 ENCOUNTER — Ambulatory Visit: Payer: Medicare Other | Attending: Physician Assistant

## 2021-12-12 DIAGNOSIS — M25572 Pain in left ankle and joints of left foot: Secondary | ICD-10-CM | POA: Diagnosis not present

## 2021-12-12 DIAGNOSIS — R2689 Other abnormalities of gait and mobility: Secondary | ICD-10-CM | POA: Diagnosis not present

## 2021-12-12 DIAGNOSIS — M6281 Muscle weakness (generalized): Secondary | ICD-10-CM | POA: Insufficient documentation

## 2021-12-12 NOTE — Therapy (Signed)
?OUTPATIENT PHYSICAL THERAPY TREATMENT NOTE ? ? ?Patient Name: Andrea Kelley ?MRN: [id number] ?DOB:12-May-1941, 81 y.o., female ?Today's Date: 12/12/2021 ? ?PCP: Martinique, Betty G, MD ?REFERRING PROVIDER: Martinique, Betty G, MD ? ? ?  ? PT End of Session - 12/12/21 1010   ? ? Visit Number 15   ? Date for PT Re-Evaluation 12/18/21   ? Authorization Type BCBS Medicare   ? Progress Note Due on Visit 20   ? PT Start Time [id number]   ? PT Stop Time 1013   ? PT Time Calculation (min) 41 min   ? Activity Tolerance Patient tolerated treatment well   ? Behavior During Therapy [facility] for tasks assessed/performed   ? ?  ?  ? ?  ? ? ? ? ? ? ? ? ? ? ? ? ?Past Medical History:  ?Diagnosis Date  ? DDD (degenerative disc disease), lumbar   ? Hyperlipidemia   ? Osteopenia   ? [place]  ? Syncope   ? ?Past Surgical History:  ?Procedure Laterality Date  ? ABDOMINAL HYSTERECTOMY    ? For dysfunctional bleeding; no BSO  ? BACK SURGERY  1970  ? ruptured disc ; Dr Quentin Cornwall  ? COLONOSCOPY  2001  ? Negative; done for rectal bleeding  ? LOOP RECORDER INSERTION N/A 02/16/2017  ? Procedure: Loop Recorder Insertion;  Surgeon: Sanda Klein, MD;  Location: [place] [facility];  Service: Cardiovascular;  Laterality: N/A;  ? ?Patient Active Problem List  ? Diagnosis Date Noted  ? Bimalleolar fracture of left ankle 09/05/2021  ? Routine general medical examination at a health care facility 05/27/2018  ? Essential (primary) hypertension 05/20/2017  ? DDD (degenerative disc disease), lumbar 01/16/2011  ? RESTLESS LEG SYNDROME 06/07/2009  ? Hyperglycemia 06/07/2009  ? Hyperlipidemia 07/19/2007  ? ? ?REFERRING DIAG: Closed bimalleolar fracture of the Lt ankle, initial encounter ? ?THERAPY DIAG:  ?Pain in left ankle and joints of left foot ? ?Muscle weakness (generalized) ? ?Other abnormalities of gait and mobility ? ?PERTINENT HISTORY: Osteopenia, lumbar DDD, Lt ankle fracture 08/31/21 due to fall down the steps ? ?PRECAUTIONS: Pt is now WBAT in CAM boot  and wean  to ASO per MD note. ? ?SUBJECTIVE: I am walking down the steps the right way now.  It is painful but not terrible.  ? ?PAIN:  ?Are you having pain? No ?NPRS scale: 0/10 ?Pain location: Lt foot  ?Pain orientation: Left  ?PAIN TYPE: aching and tight ?Pain description: intermittent  ?Aggravating factors: standing, walking ?Relieving factors: rest, ice, ibuprofen  ? ? ?OBJECTIVE:  ?  ?DIAGNOSTIC FINDINGS: 10/17/21: As of the left ankle obtained in 3 projections.  The previously identified  ?medial and lateral malleolar fractures are unchanged in position and  ?appear to be healing with opacification of the fracture lines and some  ?callus.  There is widening of the medial ankle mortise but no change from  ?prior films.  Approximately 1 to 2 mm of widening ?  ?PATIENT SURVEYS:  ?FOTO 32 (goal is 59)-evaluation ?FOTO: 68 (11/20/21) ?  ?COGNITION: ?          Overall cognitive status: Within functional limits for tasks assessed               ?           ?SENSATION: ?WFL ?  ?POSTURE:  ?WFLs  ?  ?PALPATION: ?Edema in Lt foot dorsum, reduced metatarsal mobility, palpable tenderness over bil ankle at malleolus. ?  ?LE ROM: ?  ?  Active ROM Right ?10/23/2021 Left ?10/23/2021 Left ?10/28/21  ?Hip flexion       ?Hip extension       ?Hip abduction       ?Hip adduction       ?Hip internal rotation       ?Hip external rotation       ?Knee flexion       ?Knee extension       ?Ankle dorsiflexion FULL 0   ?Ankle plantarflexion FULL full   ?Ankle inversion FULL Limited by 25%   ?Ankle eversion FULL Limited by 50% Limited less than 25%, eyeballs equal to RT  ? (Blank rows = not tested) ?  ?LE MMT: ?  ?MMT Right ?10/23/2021 Left ?10/23/2021 Left  ?11/20/21  ?Hip flexion       ?Hip extension       ?Hip abduction       ?Hip adduction       ?Hip internal rotation       ?Hip external rotation       ?Knee flexion       ?Knee extension       ?Ankle dorsiflexion   4/5 4+/5  ?Ankle plantarflexion   NT 4-/5  ?Ankle inversion   4/5 4+/5  ?Ankle eversion   4-/5  4+/5  ? (Blank rows = not tested) ?  ?GAIT: 11/20/21 ?Distance walked: 100 ?Assistive device utilized: cane on Rt ?Level of assistance: SBA ?Comments: minimal antalgia  ?Steps: alternating pattern ascending and step-to descending with use of 1 rail ?   ? ?TODAY'S TREATMENT: ?12/12/21: ?Nustep L6 x 10 min with PTA present to discuss progress ?Obstacle course: stepping over hurdles and over uneven tiles (in CR gym under table)-forward and sidestepping ?Tandem walk on Airex balance beam next to barre for UE support  ?Resisted walking: 10# forward and reverse, 5# sidestepping x 8 each ?Rockerboard x 3 minutes seated ankle DF/PF VC to slow speed ?Weight shift on mini tramp x 1 min each- with min to no UE support ?Low hurdles: step over forward and sideways  ?6" step downs on Lt 2x10 without brace- verbal cues for control and alignment of foot  ?Dynamic weight shifting 3 ways 1 min on mini tramp ?Lt LAQ 4# 2x10 ?12/04/21: ?Nustep L6 x 10 min with PTA present to discuss progress ?Resisted walking: 10# forward and reverse, 5# sidestepping x 8 each ?Rockerboard x 3 minutes seated ankle DF/PF VC to slow speed ?6" step ups Lt 2x10, Vc to keep foot straight ?Low hurdles: step over forward and sideways  ?6" step downs on Lt 2x10 without brace- verbal cues for control and alignment of foot Dynamic weight shifting 3 ways 1 min on mini tramp ?Sit to stand 2x10 from low mat holding 5# KB ?LT LAQ 4# 2x10 ? ?12/02/21: ?Nustep L6 x 10 min with PTA present to discuss progress ?Rockerboard x 3 minutes seated ankle DF/PF VC to slow speed ?Calf stretch on Pro-stretch 3x 30 sec ?6" step ups Lt 2x10, Vc to keep foot straight ?Low hurdles: step over forward and sideways  ?6" step downs on Lt 2x10 without brace- verbal cues for control and alignment of foot Dynamic weight shifting 3 ways 1 min on mini tramp ?Resisted walking: 10# holding handles ?Sit to stand 2x10 from low mat holding 5# KB ?Calf press Bil on leg press 20x 30# ?LT LAQ 4#  2x10 ? ? ?  ? ?PATIENT EDUCATION:  ?Education details: Access Code: [email or web address] ?Person educated: Patient ?Education  method: Explanation, Demonstration, and Handouts ?Education comprehension: verbalized understanding and returned demonstration ?  ?  ?HOME EXERCISE PROGRAM: ?Access Code: [email or web address] ?URL: [email or web address] ?Date: 10/23/2021 ?Prepared by: Claiborne Billings ?  ?Exercises ?Side to Side Weight Shift with Unilateral Counter Support - 3 x daily - 7 x weekly - 2 sets - 10 reps ?Stride Stance Weight Shift - 3 x daily - 7 x weekly - 2 sets - 10 reps ?Long Sitting Calf Stretch with Strap - 2 x daily - 7 x weekly - 10 reps - 10 hold ?Supine Ankle Inversion Eversion AROM - 4 x daily - 7 x weekly - 20 reps ?Supine Ankle Circles - 4 x daily - 7 x weekly - 20 reps ?Supine Ankle Dorsiflexion and Plantarflexion AROM - 4 x daily - 7 x weekly - 3 sets - 10 reps ?Seated Heel Toe Raises - 3 x daily - 7 x weekly - 2 sets - 10 reps ?Seated Long Arc Quad - 3 x daily - 7 x weekly - 2 sets - 10 reps ?Seated March - 3 x daily - 7 x weekly - 2 sets - 10 reps ?  ?  ?ASSESSMENT: ?  ?CLINICAL IMPRESSION: Pt continues to make progress s/p Lt ankle fracture. Pt reports that she is negotiating steps with reciprocal pattern and experiences some pain with descending.  She is walking longer distances at home and increases this each time she walks. Working on high level balance to prepare for her trip in June and pt required guarding by PT for safety.  Pt was able to stabilize independently on unlevel surface with obstacle course.  Patient will benefit from skilled PT to address the below impairments and improve overall function.  ? ?OBJECTIVE IMPAIRMENTS Abnormal gait, decreased activity tolerance, decreased balance, decreased coordination, decreased endurance, difficulty walking, decreased ROM, decreased strength, impaired flexibility, and pain.  ?  ?ACTIVITY LIMITATIONS community activity, meal prep, and shopping.  ?  ?PERSONAL  FACTORS Age and 1 comorbidity: Lt ankle fracture   are also affecting patient's functional outcome.  ?  ?  ?REHAB POTENTIAL: Excellent ?  ?CLINICAL DECISION MAKING: Stable/uncomplicated ?  ?EVALUATION COMPLEXITY: L

## 2021-12-16 ENCOUNTER — Encounter: Payer: Self-pay | Admitting: Physical Therapy

## 2021-12-16 ENCOUNTER — Ambulatory Visit: Payer: Medicare Other | Admitting: Physical Therapy

## 2021-12-16 DIAGNOSIS — R2689 Other abnormalities of gait and mobility: Secondary | ICD-10-CM | POA: Diagnosis not present

## 2021-12-16 DIAGNOSIS — M25572 Pain in left ankle and joints of left foot: Secondary | ICD-10-CM

## 2021-12-16 DIAGNOSIS — M6281 Muscle weakness (generalized): Secondary | ICD-10-CM

## 2021-12-16 NOTE — Therapy (Signed)
?OUTPATIENT PHYSICAL THERAPY TREATMENT NOTE ? ? ?Patient Name: Andrea Kelley ?MRN: [id number] ?DOB:10/24/1940, 81 y.o., female ?Today's Date: 12/16/2021 ? ?PCP: [place]ique, Betty G, MD ?REFERRING PROVIDER: Persons, Bevely Palmer, [place] ? ? ?  ? PT End of Session - 12/16/21 [id number]   ? ? Visit Number [id number]   ? Date for PT Re-Evaluation 12/18/21   ? Authorization Type BCBS Medicare   ? Progress Note Due on Visit 20   ? PT Start Time [phone]   ? PT Stop Time 1009   ? PT Time Calculation (min) 51 min   ? Activity Tolerance Patient tolerated treatment well   ? Behavior During Therapy [facility] for tasks assessed/performed   ? ?  ?  ? ?  ? ? ? ? ? ? ? ? ? ? ? ? ? ?Past Medical History:  ?Diagnosis Date  ? DDD (degenerative disc disease), lumbar   ? Hyperlipidemia   ? Osteopenia   ? [place]  ? Syncope   ? ?Past Surgical History:  ?Procedure Laterality Date  ? ABDOMINAL HYSTERECTOMY    ? For dysfunctional bleeding; no BSO  ? BACK SURGERY  1970  ? ruptured disc ; Dr Quentin Cornwall  ? COLONOSCOPY  2001  ? Negative; done for rectal bleeding  ? LOOP RECORDER INSERTION N/A 02/16/2017  ? Procedure: Loop Recorder Insertion;  Surgeon: Sanda Klein, MD;  Location: [place] [facility];  Service: Cardiovascular;  Laterality: N/A;  ? ?Patient Active Problem List  ? Diagnosis Date Noted  ? Bimalleolar fracture of left ankle 09/05/2021  ? Routine general medical examination at a health care facility 05/27/2018  ? Essential (primary) hypertension 05/20/2017  ? DDD (degenerative disc disease), lumbar 01/16/2011  ? RESTLESS LEG SYNDROME 06/07/2009  ? Hyperglycemia 06/07/2009  ? Hyperlipidemia 07/19/2007  ? ? ?REFERRING DIAG: Closed bimalleolar fracture of the Lt ankle, initial encounter ? ?THERAPY DIAG:  ?Pain in left ankle and joints of left foot ? ?Muscle weakness (generalized) ? ?Other abnormalities of gait and mobility ? ?PERTINENT HISTORY: Osteopenia, lumbar DDD, Lt ankle fracture 08/31/21 due to fall down the steps ? ?PRECAUTIONS: Pt is now WBAT in CAM boot  and  wean to ASO per MD note. ? ?SUBJECTIVE: No new complaints ? ?PAIN:  ?Are you having pain? No ?NPRS scale: 0/10 ?Pain location: Lt foot  ?Pain orientation: Left  ?PAIN TYPE: aching and tight ?Pain description: intermittent  ?Aggravating factors: standing, walking ?Relieving factors: rest, ice, ibuprofen  ? ? ?OBJECTIVE:  ?  ?DIAGNOSTIC FINDINGS: 10/17/21: As of the left ankle obtained in 3 projections.  The previously identified  ?medial and lateral malleolar fractures are unchanged in position and  ?appear to be healing with opacification of the fracture lines and some  ?callus.  There is widening of the medial ankle mortise but no change from  ?prior films.  Approximately 1 to 2 mm of widening ?  ?PATIENT SURVEYS:  ?FOTO 32 (goal is 59)-evaluation ?FOTO: 68 (11/20/21) ?  ?COGNITION: ?          Overall cognitive status: Within functional limits for tasks assessed               ?           ?SENSATION: ?WFL ?  ?POSTURE:  ?WFLs  ?  ?PALPATION: ?Edema in Lt foot dorsum, reduced metatarsal mobility, palpable tenderness over bil ankle at malleolus. ?  ?LE ROM: ?  ?Active ROM Right ?10/23/2021 Left ?10/23/2021 Left ?10/28/21  ?Hip flexion       ?  Hip extension       ?Hip abduction       ?Hip adduction       ?Hip internal rotation       ?Hip external rotation       ?Knee flexion       ?Knee extension       ?Ankle dorsiflexion FULL 0   ?Ankle plantarflexion FULL full   ?Ankle inversion FULL Limited by 25%   ?Ankle eversion FULL Limited by 50% Limited less than 25%, eyeballs equal to RT  ? (Blank rows = not tested) ?  ?LE MMT: ?  ?MMT Right ?10/23/2021 Left ?10/23/2021 Left  ?11/20/21  ?Hip flexion       ?Hip extension       ?Hip abduction       ?Hip adduction       ?Hip internal rotation       ?Hip external rotation       ?Knee flexion       ?Knee extension       ?Ankle dorsiflexion   4/5 4+/5  ?Ankle plantarflexion   NT 4-/5  ?Ankle inversion   4/5 4+/5  ?Ankle eversion   4-/5 4+/5  ? (Blank rows = not tested) ?  ?GAIT:  11/20/21 ?Distance walked: 100 ?Assistive device utilized: cane on Rt ?Level of assistance: SBA ?Comments: minimal antalgia  ?Steps: alternating pattern ascending and step-to descending with use of 1 rail ?   ? ?TODAY'S TREATMENT: ? ?12/16/21: ?Nustep L6 x 10 min with PTA present to discuss progress ?Second step ankle joint PF/DF stretch 2x 20 ?15# side stepping 10x each ?LTLE with assit of RTfoot 25% x10 ?Tandem stance with blue plyo toss 10x Bil, 2x10 with RTLE on stool and LTLE SLS ?Stepping over hurdles 8 lengths SBA ?LAQ 5# 2x10 ?Weight shift on mini tramp x 1 min each- with min to no UE support ?6" step downs with pt choice of UE support 2x10/ going down 4 steps 5x with 1 rail.  ?Rockerboard x 3 minutes seated ankle DF/PF VC to slow speed ? ?12/12/21: ?Nustep L6 x 10 min with PTA present to discuss progress ?Obstacle course: stepping over hurdles and over uneven tiles (in CR gym under table)-forward and sidestepping ?Tandem walk on Airex balance beam next to barre for UE support  ?Resisted walking: 10# forward and reverse, 5# sidestepping x 8 each ?Rockerboard x 3 minutes seated ankle DF/PF VC to slow speed ?Weight shift on mini tramp x 1 min each- with min to no UE support ?Low hurdles: step over forward and sideways  ?6" step downs on Lt 2x10 without brace- verbal cues for control and alignment of foot  ?Dynamic weight shifting 3 ways 1 min on mini tramp ?Lt LAQ 4# 2x10 ?12/04/21: ?Nustep L6 x 10 min with PTA present to discuss progress ?Resisted walking: 10# forward and reverse, 5# sidestepping x 8 each ?Rockerboard x 3 minutes seated ankle DF/PF VC to slow speed ?6" step ups Lt 2x10, Vc to keep foot straight ?Low hurdles: step over forward and sideways  ?6" step downs on Lt 2x10 without brace- verbal cues for control and alignment of foot Dynamic weight shifting 3 ways 1 min on mini tramp ?Sit to stand 2x10 from low mat holding 5# KB ?LT LAQ 4# 2x10 ? ?12/02/21: ?Nustep L6 x 10 min with PTA present to discuss  progress ?Rockerboard x 3 minutes seated ankle DF/PF VC to slow speed ?Calf stretch on Pro-stretch 3x 30 sec ?6"  step ups Lt 2x10, Vc to keep foot straight ?Low hurdles: step over forward and sideways  ?6" step downs on Lt 2x10 without brace- verbal cues for control and alignment of foot Dynamic weight shifting 3 ways 1 min on mini tramp ?Resisted walking: 10# holding handles ?Sit to stand 2x10 from low mat holding 5# KB ?Calf press Bil on leg press 20x 30# ?LT LAQ 4# 2x10 ? ? ?  ? ?PATIENT EDUCATION:  ?Education details: Access Code: [email or web address] ?Person educated: Patient ?Education method: Explanation, Demonstration, and Handouts ?Education comprehension: verbalized understanding and returned demonstration ?  ?  ?HOME EXERCISE PROGRAM: ?Access Code: [email or web address] ?URL: [email or web address] ?Date: 10/23/2021 ?Prepared by: Claiborne Billings ?  ?Exercises ?Side to Side Weight Shift with Unilateral Counter Support - 3 x daily - 7 x weekly - 2 sets - 10 reps ?Stride Stance Weight Shift - 3 x daily - 7 x weekly - 2 sets - 10 reps ?Long Sitting Calf Stretch with Strap - 2 x daily - 7 x weekly - 10 reps - 10 hold ?Supine Ankle Inversion Eversion AROM - 4 x daily - 7 x weekly - 20 reps ?Supine Ankle Circles - 4 x daily - 7 x weekly - 20 reps ?Supine Ankle Dorsiflexion and Plantarflexion AROM - 4 x daily - 7 x weekly - 3 sets - 10 reps ?Seated Heel Toe Raises - 3 x daily - 7 x weekly - 2 sets - 10 reps ?Seated Long Arc Quad - 3 x daily - 7 x weekly - 2 sets - 10 reps ?Seated March - 3 x daily - 7 x weekly - 2 sets - 10 reps ?  ?  ?ASSESSMENT: ?  ?CLINICAL IMPRESSION: Pt continues to make progress s/p Lt ankle fracture. Pt reports her toes feel stiff. She has not been practicing her toe ROM at home but will try to do better. Patient will benefit from skilled PT to address the below impairments and improve overall function. Pt requires cuing for control going down steps. ? ?OBJECTIVE IMPAIRMENTS Abnormal gait, decreased activity  tolerance, decreased balance, decreased coordination, decreased endurance, difficulty walking, decreased ROM, decreased strength, impaired flexibility, and pain.  ?  ?ACTIVITY LIMITATIONS community activity, meal prep

## 2021-12-19 ENCOUNTER — Ambulatory Visit: Payer: Medicare Other

## 2021-12-19 DIAGNOSIS — M6281 Muscle weakness (generalized): Secondary | ICD-10-CM | POA: Diagnosis not present

## 2021-12-19 DIAGNOSIS — M25572 Pain in left ankle and joints of left foot: Secondary | ICD-10-CM | POA: Diagnosis not present

## 2021-12-19 DIAGNOSIS — R2689 Other abnormalities of gait and mobility: Secondary | ICD-10-CM | POA: Diagnosis not present

## 2021-12-19 NOTE — Therapy (Signed)
?OUTPATIENT PHYSICAL THERAPY TREATMENT NOTE ? ? ?Patient Name: Andrea Kelley ?MRN: [id number] ?DOB:Aug 29, 1940, 81 y.o., female ?Today's Date: 12/19/2021 ? ?PCP: [place], Betty G, MD ?REFERRING PROVIDER: Norlene Campbell, MD ? ? ?  ? PT End of Session - 12/19/21 1013   ? ? Visit Number 17   ? Date for PT Re-Evaluation 01/10/22   ? Authorization Type BCBS Medicare   ? Progress Note Due on Visit 20   ? PT Start Time 0932   ? PT Stop Time 1013   ? PT Time Calculation (min) 41 min   ? Activity Tolerance Patient tolerated treatment well   ? Behavior During Therapy [facility] for tasks assessed/performed   ? ?  ?  ? ?  ? ? ? ? ? ? ? ? ? ? ? ? ? ? ?Past Medical History:  ?Diagnosis Date  ? DDD (degenerative disc disease), lumbar   ? Hyperlipidemia   ? Osteopenia   ? Gorman Elam  ? Syncope   ? ?Past Surgical History:  ?Procedure Laterality Date  ? ABDOMINAL HYSTERECTOMY    ? For dysfunctional bleeding; no BSO  ? BACK SURGERY  1970  ? ruptured disc ; Dr Roxan Hockey  ? COLONOSCOPY  2001  ? Negative; done for rectal bleeding  ? LOOP RECORDER INSERTION N/A 02/16/2017  ? Procedure: Loop Recorder Insertion;  Surgeon: Thurmon Fair, MD;  Location: [facility];  Service: Cardiovascular;  Laterality: N/A;  ? ?Patient Active Problem List  ? Diagnosis Date Noted  ? Bimalleolar fracture of left ankle 09/05/2021  ? Routine general medical examination at a health care facility 05/27/2018  ? Essential (primary) hypertension 05/20/2017  ? DDD (degenerative disc disease), lumbar 01/16/2011  ? RESTLESS LEG SYNDROME 06/07/2009  ? Hyperglycemia 06/07/2009  ? Hyperlipidemia 07/19/2007  ? ? ?REFERRING DIAG: Closed bimalleolar fracture of the Lt ankle, initial encounter ? ?THERAPY DIAG:  ?Pain in left ankle and joints of left foot - Plan: PT plan of care cert/re-cert ? ?Muscle weakness (generalized) - Plan: PT plan of care cert/re-cert ? ?Other abnormalities of gait and mobility - Plan: PT plan of care cert/re-cert ? ?PERTINENT HISTORY: Osteopenia,  lumbar DDD, Lt ankle fracture 08/31/21 due to fall down the steps ? ?PRECAUTIONS: Pt is now WBAT in CAM boot  and wean to ASO per MD note. ? ?SUBJECTIVE:  I feel about 90% of my normal.  I don't have as much stamina as I want and my walking isn't normal yet.   ? ?PAIN:  ?Are you having pain? No ?NPRS scale: 0/10 ?Pain location: Lt foot  ?Pain orientation: Left  ?PAIN TYPE: aching and tight ?Pain description: intermittent  ?Aggravating factors: standing, walking ?Relieving factors: rest, ice, ibuprofen  ? ? ?OBJECTIVE:  ?  ?DIAGNOSTIC FINDINGS: 10/17/21: As of the left ankle obtained in 3 projections.  The previously identified  ?medial and lateral malleolar fractures are unchanged in position and  ?appear to be healing with opacification of the fracture lines and some  ?callus.  There is widening of the medial ankle mortise but no change from  ?prior films.  Approximately 1 to 2 mm of widening ?  ?PATIENT SURVEYS:  ?FOTO 32 (goal is 59)-evaluation ?FOTO: 68 (11/20/21) ?  ?COGNITION: ?          Overall cognitive status: Within functional limits for tasks assessed               ?           ?SENSATION: ?  WFL ?  ?POSTURE:  ?WFLs  ?  ?PALPATION: ?Edema in Lt foot dorsum, reduced metatarsal mobility, palpable tenderness over bil ankle at malleolus. ?  ?LE ROM: ?  ?Active ROM Right ?10/23/2021 Left ?10/23/2021 Left ?10/28/21  ?Hip flexion       ?Hip extension       ?Hip abduction       ?Hip adduction       ?Hip internal rotation       ?Hip external rotation       ?Knee flexion       ?Knee extension       ?Ankle dorsiflexion FULL 0   ?Ankle plantarflexion FULL full   ?Ankle inversion FULL Limited by 25%   ?Ankle eversion FULL Limited by 50% Limited less than 25%, eyeballs equal to RT  ? (Blank rows = not tested) ?  ?LE MMT: ?  ?MMT Right ?10/23/2021 Left ?10/23/2021 Left  ?11/20/21 Left ?12/19/21  ?Hip flexion        ?Hip extension        ?Hip abduction        ?Hip adduction        ?Hip internal rotation        ?Hip external rotation         ?Knee flexion        ?Knee extension        ?Ankle dorsiflexion   4/5 4+/5 5/5  ?Ankle plantarflexion   NT 4-/5 4-/5  ?Ankle inversion   4/5 4+/5 5/5  ?Ankle eversion   4-/5 4+/5 4+/5  ? (Blank rows = not tested) ?  ?GAIT: 11/20/21 ?Distance walked: 100 ?Assistive device utilized: cane on Rt ?Level of assistance: SBA ?Comments: minimal antalgia  ?Steps: alternating pattern ascending and step-to descending with use of 1 rail ?   ? ?TODAY'S TREATMENT: ?12/19/21: ?Nustep L6 x 10 min with PTA present to discuss progress ?Second step ankle joint PF/DF stretch 2x 20 ?15# side stepping 10x each ?Lt LE with assit of Rt foot 25% x10 ?Tandem stance with blue plyo toss 10x Bil, 2x10 with RTLE on stool and LTLE SLS ?Stepping over hurdles 8 lengths SBA ?LAQ 5# 2x10 ?Weight shift on mini tramp x 1 min each- with min to no UE support ?6" step downs with pt choice of UE support 2x10/ going down 4 steps 5x with 1 rail.  ?Rockerboard x 3 minutes seated ankle DF/PF VC to slow speed ? ? ?12/16/21: ?Nustep L6 x 10 min with PTA present to discuss progress ?Second step ankle joint PF/DF stretch 2x 20 ?15# side stepping 10x each ?Obstacle course: stepping over hurdles and over uneven tiles (in CR gym under table)-forward and sidesteppin ?LAQ 5# 2x10 ?Weight shift on mini tramp x 1 min each- with min to no UE support ?6" step downs with pt choice of UE support 2x10/ going down 4 steps 5x with 1 rail.  ?Rockerboard x 3 minutes seated ankle DF/PF VC to slow speed ? ?12/12/21: ?Nustep L6 x 10 min with PTA present to discuss progress ?Obstacle course: stepping over hurdles and over uneven tiles (in CR gym under table)-forward and sidestepping ?Tandem walk on Airex balance beam next to barre for UE support  ?Resisted walking: 10# forward and reverse, 5# sidestepping x 8 each ?Rockerboard x 3 minutes seated ankle DF/PF VC to slow speed ?Weight shift on mini tramp x 1 min each- with min to no UE support ?Low hurdles: step over forward and sideways   ?6"  step downs on Lt 2x10 without brace- verbal cues for control and alignment of foot  ?Dynamic weight shifting 3 ways 1 min on mini tramp ?Lt LAQ 4# 2x10 ? ? ?PATIENT EDUCATION:  ?Education details: Access Code: [email or web address] ?Person educated: Patient ?Education method: Explanation, Demonstration, and Handouts ?Education comprehension: verbalized understanding and returned demonstration ?  ?  ?HOME EXERCISE PROGRAM: ?Access Code: [email or web address] ?URL: [email or web address] ?Date: 10/23/2021 ?Prepared by: Tresa EndoKelly ?  ?Exercises ?Side to Side Weight Shift with Unilateral Counter Support - 3 x daily - 7 x weekly - 2 sets - 10 reps ?Stride Stance Weight Shift - 3 x daily - 7 x weekly - 2 sets - 10 reps ?Long Sitting Calf Stretch with Strap - 2 x daily - 7 x weekly - 10 reps - 10 hold ?Supine Ankle Inversion Eversion AROM - 4 x daily - 7 x weekly - 20 reps ?Supine Ankle Circles - 4 x daily - 7 x weekly - 20 reps ?Supine Ankle Dorsiflexion and Plantarflexion AROM - 4 x daily - 7 x weekly - 3 sets - 10 reps ?Seated Heel Toe Raises - 3 x daily - 7 x weekly - 2 sets - 10 reps ?Seated Long Arc Quad - 3 x daily - 7 x weekly - 2 sets - 10 reps ?Seated March - 3 x daily - 7 x weekly - 2 sets - 10 reps ?  ?  ?ASSESSMENT: ?  ?CLINICAL IMPRESSION: Pt continues to make progress s/p Lt ankle fracture. Pt is planning a trip to [place] in 3 weeks and PT will continue to work on stability, balance and endurance to improve safety and endurance for this trip.  Pt continues to demonstrate mild antalgia especially when fatigued and descends steps with step-over-step ~50% of the time.  She is challenged by this due to ROM and eccentric strength deficits.  Patient will benefit from skilled PT to address the below impairments and improve overall function.  ? ? ?OBJECTIVE IMPAIRMENTS Abnormal gait, decreased activity tolerance, decreased balance, decreased coordination, decreased endurance, difficulty walking, decreased ROM, decreased strength,  impaired flexibility, and pain.  ?  ?ACTIVITY LIMITATIONS community activity, meal prep, and shopping.  ?  ?PERSONAL FACTORS Age and 1 comorbidity: Lt ankle fracture   are also affecting patient's f

## 2021-12-23 ENCOUNTER — Ambulatory Visit: Payer: Medicare Other | Admitting: Physical Therapy

## 2021-12-23 ENCOUNTER — Encounter: Payer: Self-pay | Admitting: Physical Therapy

## 2021-12-23 DIAGNOSIS — M6281 Muscle weakness (generalized): Secondary | ICD-10-CM

## 2021-12-23 DIAGNOSIS — R2689 Other abnormalities of gait and mobility: Secondary | ICD-10-CM | POA: Diagnosis not present

## 2021-12-23 DIAGNOSIS — M25572 Pain in left ankle and joints of left foot: Secondary | ICD-10-CM | POA: Diagnosis not present

## 2021-12-23 NOTE — Therapy (Signed)
?OUTPATIENT PHYSICAL THERAPY TREATMENT NOTE ? ? ?Patient Name: Andrea Kelley ?MRN: [id number] ?DOB:1941-02-08, 81 y.o., female ?Today's Date: 12/23/2021 ? ?PCP: [place], Betty G, MD ?REFERRING PROVIDER: Norlene CampbellWhitfield, Peter, MD ? ? ?  ? PT End of Session - 12/23/21 0929   ? ? Visit Number 18   ? Date for PT Re-Evaluation 01/10/22   ? Authorization Type BCBS Medicare   ? Progress Note Due on Visit 20   ? PT Start Time [id number]   ? PT Stop Time 1010   ? PT Time Calculation (min) 41 min   ? Activity Tolerance Patient tolerated treatment well   ? [facility] assessed/performed   ? ?  ?  ? ?  ? ? ? ? ? ? ? ? ? ? ? ? ? ? ? ?Past Medical History:  ?Diagnosis Date  ? DDD (degenerative disc disease), lumbar   ? Hyperlipidemia   ? Osteopenia   ? [place]avia Elam  ? Syncope   ? ?Past Surgical History:  ?Procedure Laterality Date  ? ABDOMINAL HYSTERECTOMY    ? For dysfunctional bleeding; no BSO  ? BACK SURGERY  1970  ? ruptured disc ; Dr Roxan Hockeyobinson  ? COLONOSCOPY  2001  ? Negative; done for rectal bleeding  ? LOOP RECORDER INSERTION N/A 02/16/2017  ? Procedure: Loop Recorder Insertion;  Surgeon: Thurmon Fairroitoru, Mihai, MD;  Location: [facility];  Service: Cardiovascular;  Laterality: N/A;  ? ?Patient Active Problem List  ? Diagnosis Date Noted  ? Bimalleolar fracture of left ankle 09/05/2021  ? Routine general medical examination at a health care facility 05/27/2018  ? Essential (primary) hypertension 05/20/2017  ? DDD (degenerative disc disease), lumbar 01/16/2011  ? RESTLESS LEG SYNDROME 06/07/2009  ? Hyperglycemia 06/07/2009  ? Hyperlipidemia 07/19/2007  ? ? ?REFERRING DIAG: Closed bimalleolar fracture of the Lt ankle, initial encounter ? ?THERAPY DIAG:  ?Pain in left ankle and joints of left foot ? ?Muscle weakness (generalized) ? ?Other abnormalities of gait and mobility ? ?PERTINENT HISTORY: Osteopenia, lumbar DDD, Lt ankle fracture 08/31/21 due to fall down the steps ? ?PRECAUTIONS: Pt is now WBAT in CAM boot   and wean to ASO per MD note. ? ?SUBJECTIVE:  The top of foot was super touchy and hypersensitive. Better now ?PAIN:  ?Are you having pain? No ?NPRS scale: 0/10 ?Pain location: Lt foot  ?Pain orientation: Left  ?PAIN TYPE: aching and tight ?Pain description: intermittent  ?Aggravating factors: standing, walking ?Relieving factors: rest, ice, ibuprofen  ? ? ?OBJECTIVE:  ?  ?DIAGNOSTIC FINDINGS: 10/17/21: As of the left ankle obtained in 3 projections.  The previously identified  ?medial and lateral malleolar fractures are unchanged in position and  ?appear to be healing with opacification of the fracture lines and some  ?callus.  There is widening of the medial ankle mortise but no change from  ?prior films.  Approximately 1 to 2 mm of widening ?  ?PATIENT SURVEYS:  ?FOTO 32 (goal is 59)-evaluation ?FOTO: 68 (11/20/21) ?  ?COGNITION: ?          Overall cognitive status: Within functional limits for tasks assessed               ?           ?SENSATION: ?WFL ?  ?POSTURE:  ?WFLs  ?  ?PALPATION: ?Edema in Lt foot dorsum, reduced metatarsal mobility, palpable tenderness over bil ankle at malleolus. ?  ?LE ROM: ?  ?Active ROM Right ?10/23/2021  Left ?10/23/2021 Left ?10/28/21  ?Hip flexion       ?Hip extension       ?Hip abduction       ?Hip adduction       ?Hip internal rotation       ?Hip external rotation       ?Knee flexion       ?Knee extension       ?Ankle dorsiflexion FULL 0   ?Ankle plantarflexion FULL full   ?Ankle inversion FULL Limited by 25%   ?Ankle eversion FULL Limited by 50% Limited less than 25%, eyeballs equal to RT  ? (Blank rows = not tested) ?  ?LE MMT: ?  ?MMT Right ?10/23/2021 Left ?10/23/2021 Left  ?11/20/21 Left ?12/19/21  ?Hip flexion        ?Hip extension        ?Hip abduction        ?Hip adduction        ?Hip internal rotation        ?Hip external rotation        ?Knee flexion        ?Knee extension        ?Ankle dorsiflexion   4/5 4+/5 5/5  ?Ankle plantarflexion   NT 4-/5 4-/5  ?Ankle inversion   4/5 4+/5  5/5  ?Ankle eversion   4-/5 4+/5 4+/5  ? (Blank rows = not tested) ?  ?GAIT: 11/20/21 ?Distance walked: 100 ?Assistive device utilized: cane on Rt ?Level of assistance: SBA ?Comments: minimal antalgia  ?Steps: alternating pattern ascending and step-to descending with use of 1 rail ?   ? ?TODAY'S TREATMENT: ?12/23/21: ?Nustep L6 x 10 min with PTA present to discuss progress ?Heel toe walking at counter top 4x fingertip touch ?15# side stepping 10x each ?Lt LE with assit of Rt foot 25% x10 ?Tandem stance with blue plyo toss 10x Bil, 2x10 in tandem stance ?Tandem stance Bil with red band UE pumps 20 sec each side ?LAQ 5# 2x10 ?Weight shift on mini tramp x 1 min each- with min to no UE support ?6" step downs with pt choice of UE support 2x10/ going down 4 steps 5x with 1 rail.  ?Rockerboard x 3 minutes seated ankle DF/PF VC to slow speed ?12/19/21: ?Nustep L6 x 10 min with PTA present to discuss progress ?Second step ankle joint PF/DF stretch 2x 20 ?15# side stepping 10x each ?Lt LE with assit of Rt foot 25% x10 ?Tandem stance with blue plyo toss 10x Bil, 2x10 with RTLE on stool and LTLE SLS ?Stepping over hurdles 8 lengths SBA ?LAQ 5# 2x10 ?Weight shift on mini tramp x 1 min each- with min to no UE support ?6" step downs with pt choice of UE support 2x10/ going down 4 steps 5x with 1 rail.  ?Rockerboard x 3 minutes seated ankle DF/PF VC to slow speed ? ? ?12/16/21: ?Nustep L6 x 10 min with PTA present to discuss progress ?Second step ankle joint PF/DF stretch 2x 20 ?15# side stepping 10x each ?Obstacle course: stepping over hurdles and over uneven tiles (in CR gym under table)-forward and sidesteppin ?LAQ 5# 2x10 ?Weight shift on mini tramp x 1 min each- with min to no UE support ?6" step downs with pt choice of UE support 2x10/ going down 4 steps 5x with 1 rail.  ?Rockerboard x 3 minutes seated ankle DF/PF VC to slow speed ? ?12/12/21: ?Nustep L6 x 10 min with PTA present to discuss progress ?Obstacle course: stepping over  hurdles and over uneven tiles (in CR gym under table)-forward and sidestepping ?Tandem walk on Airex balance beam next to barre for UE support  ?Resisted walking: 10# forward and reverse, 5# sidestepping x 8 each ?Rockerboard x 3 minutes seated ankle DF/PF VC to slow speed ?Weight shift on mini tramp x 1 min each- with min to no UE support ?Low hurdles: step over forward and sideways  ?6" step downs on Lt 2x10 without brace- verbal cues for control and alignment of foot  ?Dynamic weight shifting 3 ways 1 min on mini tramp ?Lt LAQ 4# 2x10 ? ? ?PATIENT EDUCATION:  ?Education details: Access Code: [email or web address] ?Person educated: Patient ?Education method: Explanation, Demonstration, and Handouts ?Education comprehension: verbalized understanding and returned demonstration ?  ?  ?HOME EXERCISE PROGRAM: ?Access Code: [email or web address] ?URL: [email or web address] ?Date: 10/23/2021 ?Prepared by: Tresa Endo ?  ?Exercises ?Side to Side Weight Shift with Unilateral Counter Support - 3 x daily - 7 x weekly - 2 sets - 10 reps ?Stride Stance Weight Shift - 3 x daily - 7 x weekly - 2 sets - 10 reps ?Long Sitting Calf Stretch with Strap - 2 x daily - 7 x weekly - 10 reps - 10 hold ?Supine Ankle Inversion Eversion AROM - 4 x daily - 7 x weekly - 20 reps ?Supine Ankle Circles - 4 x daily - 7 x weekly - 20 reps ?Supine Ankle Dorsiflexion and Plantarflexion AROM - 4 x daily - 7 x weekly - 3 sets - 10 reps ?Seated Heel Toe Raises - 3 x daily - 7 x weekly - 2 sets - 10 reps ?Seated Long Arc Quad - 3 x daily - 7 x weekly - 2 sets - 10 reps ?Seated March - 3 x daily - 7 x weekly - 2 sets - 10 reps ?  ?  ?ASSESSMENT: ?  ?CLINICAL IMPRESSION: Pt reports after the last 2 PT sessions the top of her foot in the evening and throughout the night gets hypersensitive with pain and burning. This abolishes the following day. Pt thinks it could be from stretching on the steps. We held on the stair stretch today to see if this was the culprit. Pt goes to  [place] this Thursday.  ? ? ?OBJECTIVE IMPAIRMENTS Abnormal gait, decreased activity tolerance, decreased balance, decreased coordination, decreased endurance, difficulty walking, decreased ROM, decrea

## 2021-12-25 ENCOUNTER — Ambulatory Visit: Payer: Medicare Other

## 2021-12-25 DIAGNOSIS — M6281 Muscle weakness (generalized): Secondary | ICD-10-CM | POA: Diagnosis not present

## 2021-12-25 DIAGNOSIS — M25572 Pain in left ankle and joints of left foot: Secondary | ICD-10-CM | POA: Diagnosis not present

## 2021-12-25 DIAGNOSIS — R2689 Other abnormalities of gait and mobility: Secondary | ICD-10-CM

## 2021-12-25 NOTE — Therapy (Signed)
?OUTPATIENT PHYSICAL THERAPY TREATMENT NOTE ? ? ?Patient Name: Andrea Kelley ?MRN: [id number] ?DOB:1941/07/12, 81 y.o., female ?Today's Date: 12/25/2021 ? ?PCP: Martinique, Betty G, MD ?REFERRING PROVIDER: Joni Fears, MD ? ? ?  ? PT End of Session - 12/25/21 1014   ? ? Visit Number 19   ? Date for PT Re-Evaluation 01/10/22   ? Authorization Type BCBS Medicare   ? Progress Note Due on Visit 20   ? PT Start Time 8315   ? PT Stop Time 1015   ? PT Time Calculation (min) 43 min   ? Activity Tolerance Patient tolerated treatment well   ? Behavior During Therapy [facility] for tasks assessed/performed   ? ?  ?  ? ?  ? ? ? ? ? ? ? ? ? ? ? ? ? ? ? ? ?Past Medical History:  ?Diagnosis Date  ? DDD (degenerative disc disease), lumbar   ? Hyperlipidemia   ? Osteopenia   ? [place] [facility]  ? Syncope   ? ?Past Surgical History:  ?Procedure Laterality Date  ? ABDOMINAL HYSTERECTOMY    ? For dysfunctional bleeding; no BSO  ? BACK SURGERY  1970  ? ruptured disc ; Dr Quentin Cornwall  ? COLONOSCOPY  2001  ? Negative; done for rectal bleeding  ? LOOP RECORDER INSERTION N/A 02/16/2017  ? Procedure: Loop Recorder Insertion;  Surgeon: Sanda Klein, MD;  Location: [facility];  Service: Cardiovascular;  Laterality: N/A;  ? ?Patient Active Problem List  ? Diagnosis Date Noted  ? Bimalleolar fracture of left ankle 09/05/2021  ? Routine general medical examination at a health care facility 05/27/2018  ? Essential (primary) hypertension 05/20/2017  ? DDD (degenerative disc disease), lumbar 01/16/2011  ? RESTLESS LEG SYNDROME 06/07/2009  ? Hyperglycemia 06/07/2009  ? Hyperlipidemia 07/19/2007  ? ? ?REFERRING DIAG: Closed bimalleolar fracture of the Lt ankle, initial encounter ? ?THERAPY DIAG:  ?Pain in left ankle and joints of left foot ? ?Muscle weakness (generalized) ? ?Other abnormalities of gait and mobility ? ?PERTINENT HISTORY: Osteopenia, lumbar DDD, Lt ankle fracture 08/31/21 due to fall down the steps ? ?PRECAUTIONS: Pt is now WBAT in CAM boot   and wean to ASO per MD note. ? ?SUBJECTIVE:  Pain on the top of the foot is better now. ? ?PAIN:  ?Are you having pain? No ?NPRS scale: 0/10 ?Pain location: Lt foot  ?Pain orientation: Left  ?PAIN TYPE: aching and tight ?Pain description: intermittent  ?Aggravating factors: standing, walking ?Relieving factors: rest, ice, ibuprofen  ? ? ?OBJECTIVE:  ?  ?DIAGNOSTIC FINDINGS: 10/17/21: As of the left ankle obtained in 3 projections.  The previously identified  ?medial and lateral malleolar fractures are unchanged in position and  ?appear to be healing with opacification of the fracture lines and some  ?callus.  There is widening of the medial ankle mortise but no change from  ?prior films.  Approximately 1 to 2 mm of widening ?  ?PATIENT SURVEYS:  ?FOTO 32 (goal is 59)-evaluation ?FOTO: 68 (11/20/21) ?  ?COGNITION: ?          Overall cognitive status: Within functional limits for tasks assessed               ?           ?SENSATION: ?WFL ?  ?POSTURE:  ?WFLs  ?  ?PALPATION: ?Edema in Lt foot dorsum, reduced metatarsal mobility, palpable tenderness over bil ankle at malleolus. ?  ?LE ROM: ?  ?Active ROM Right ?  10/23/2021 Left ?10/23/2021 Left ?10/28/21  ?Hip flexion       ?Hip extension       ?Hip abduction       ?Hip adduction       ?Hip internal rotation       ?Hip external rotation       ?Knee flexion       ?Knee extension       ?Ankle dorsiflexion FULL 0   ?Ankle plantarflexion FULL full   ?Ankle inversion FULL Limited by 25%   ?Ankle eversion FULL Limited by 50% Limited less than 25%, eyeballs equal to RT  ? (Blank rows = not tested) ?  ?LE MMT: ?  ?MMT Right ?10/23/2021 Left ?10/23/2021 Left  ?11/20/21 Left ?12/19/21  ?Hip flexion        ?Hip extension        ?Hip abduction        ?Hip adduction        ?Hip internal rotation        ?Hip external rotation        ?Knee flexion        ?Knee extension        ?Ankle dorsiflexion   4/5 4+/5 5/5  ?Ankle plantarflexion   NT 4-/5 4-/5  ?Ankle inversion   4/5 4+/5 5/5  ?Ankle  eversion   4-/5 4+/5 4+/5  ? (Blank rows = not tested) ?  ?GAIT: 11/20/21 ?Distance walked: 100 ?Assistive device utilized: cane on Rt ?Level of assistance: SBA ?Comments: minimal antalgia  ?Steps: alternating pattern ascending and step-to descending with use of 1 rail ?   ? ?TODAY'S TREATMENT: ?12/25/21: ?Nustep L6 x 10 min with PT present to discuss progress ?Heel toe walking at counter top 4x fingertip touch ?15# side stepping 10x each ?Walking over hurdles and unlevel tiles forward and sidestepping  ?Tandem stance on blue balance pad 2x20 seconds Rt and Lt ?LAQ 5# 2x10 on Lt ?Weight shift on mini tramp x 1 min each- with min to no UE support ?Rockerboard x 3 minutes standing ? ?12/23/21: ?Nustep L6 x 10 min with PTA present to discuss progress ?Heel toe walking at counter top 4x fingertip touch ?15# side stepping 10x each ?Lt LE with assit of Rt foot 25% x10 ?Tandem stance with blue plyo toss 10x Bil, 2x10 in tandem stance ?Tandem stance Bil with red band UE pumps 20 sec each side ?LAQ 5# 2x10 ?Weight shift on mini tramp x 1 min each- with min to no UE support ?6" step downs with pt choice of UE support 2x10/ going down 4 steps 5x with 1 rail.  ?Rockerboard x 3 minutes seated ankle DF/PF VC to slow speed ? ?12/19/21: ?Nustep L6 x 10 min with PTA present to discuss progress ?Second step ankle joint PF/DF stretch 2x 20 ?15# side stepping 10x each ?Lt LE with assit of Rt foot 25% x10 ?Tandem stance with blue plyo toss 10x Bil, 2x10 with RTLE on stool and LTLE SLS ?Stepping over hurdles 8 lengths SBA ?LAQ 5# 2x10 ?Weight shift on mini tramp x 1 min each- with min to no UE support ?6" step downs with pt choice of UE support 2x10/ going down 4 steps 5x with 1 rail.  ?Rockerboard x 3 minutes seated ankle DF/PF VC to slow speed ? ? ?PATIENT EDUCATION:  ?Education details: Access Code: [email or web address] ?Person educated: Patient ?Education method: Explanation, Demonstration, and Handouts ?Education comprehension: verbalized  understanding and returned demonstration ?  ?  ?HOME EXERCISE  PROGRAM: ?Access Code: [email or web address] ?URL: [email or web address] ?Date: 10/23/2021 ?Prepared by: Claiborne Billings ?  ?Exercises ?Side to Side Weight Shift with Unilateral Counter Support - 3 x daily - 7 x weekly - 2 sets - 10 reps ?Stride Stance Weight Shift - 3 x daily - 7 x weekly - 2 sets - 10 reps ?Long Sitting Calf Stretch with Strap - 2 x daily - 7 x weekly - 10 reps - 10 hold ?Supine Ankle Inversion Eversion AROM - 4 x daily - 7 x weekly - 20 reps ?Supine Ankle Circles - 4 x daily - 7 x weekly - 20 reps ?Supine Ankle Dorsiflexion and Plantarflexion AROM - 4 x daily - 7 x weekly - 3 sets - 10 reps ?Seated Heel Toe Raises - 3 x daily - 7 x weekly - 2 sets - 10 reps ?Seated Long Arc Quad - 3 x daily - 7 x weekly - 2 sets - 10 reps ?Seated March - 3 x daily - 7 x weekly - 2 sets - 10 reps ?  ?  ?ASSESSMENT: ?  ?CLINICAL IMPRESSION: Pt is making good progress s/p Lt ankle fracture.  Pt continues to work on balance and endurance to improve safety at home and in the community.  Pt is traveling to [place] this weekend.  Descending steps is most challenging and she is working on this at home. Pt required stand by and contact guard assist for safety with balance tasks.  Patient will benefit from skilled PT to address the below impairments and improve overall function.  ? ? ?OBJECTIVE IMPAIRMENTS Abnormal gait, decreased activity tolerance, decreased balance, decreased coordination, decreased endurance, difficulty walking, decreased ROM, decreased strength, impaired flexibility, and pain.  ?  ?ACTIVITY LIMITATIONS community activity, meal prep, and shopping.  ?  ?PERSONAL FACTORS Age and 1 comorbidity: Lt ankle fracture   are also affecting patient's functional outcome.  ?  ?  ?REHAB POTENTIAL: Excellent ?  ?CLINICAL DECISION MAKING: Stable/uncomplicated ?  ?EVALUATION COMPLEXITY: Low ? ?GOALS: ?Goals reviewed with patient? Yes ?  ?SHORT TERM GOALS: Target  date: 11/20/21 ?  ?Be independent in initial HEP ?Baseline:  ?Goal status: Goal met 10/25/21 ?  ?2.  Ambulate with CAM boot or ASO with FWB with least restrictive device for all distances  ?Baseline: wearing ASO, 50% of

## 2022-01-02 ENCOUNTER — Ambulatory Visit: Payer: Medicare Other

## 2022-01-02 DIAGNOSIS — M25572 Pain in left ankle and joints of left foot: Secondary | ICD-10-CM | POA: Diagnosis not present

## 2022-01-02 DIAGNOSIS — M6281 Muscle weakness (generalized): Secondary | ICD-10-CM | POA: Diagnosis not present

## 2022-01-02 DIAGNOSIS — R2689 Other abnormalities of gait and mobility: Secondary | ICD-10-CM | POA: Diagnosis not present

## 2022-01-02 NOTE — Therapy (Signed)
OUTPATIENT PHYSICAL THERAPY TREATMENT NOTE   Patient Name: Andrea Kelley MRN: [id number] DOB:Apr 19, 1941, 81 y.o., female Today's Date: 01/02/2022  PCP: [place], Betty G, MD REFERRING PROVIDER: Joni Fears, MD  Progress Note Reporting Period 11/26/21 to 01/02/22  See note below for Objective Data and Assessment of Progress/Goals.        PT End of Session - 01/02/22 0843     Visit Number 20    Date for PT Re-Evaluation 01/10/22    Authorization Type BCBS Medicare    Progress Note Due on Visit 63    PT Start Time 0802    PT Stop Time [phone]    PT Time Calculation (min) 41 min    Activity Tolerance Patient tolerated treatment well    Behavior During Therapy WFL for tasks assessed/performed                            Past Medical History:  Diagnosis Date   DDD (degenerative disc disease), lumbar    Hyperlipidemia    Osteopenia    [place] [facility]   Syncope    Past Surgical History:  Procedure Laterality Date   ABDOMINAL HYSTERECTOMY     For dysfunctional bleeding; no BSO   BACK SURGERY  1970   ruptured disc ; Dr Quentin Cornwall   COLONOSCOPY  2001   Negative; done for rectal bleeding   LOOP RECORDER INSERTION N/A 02/16/2017   Procedure: Loop Recorder Insertion;  Surgeon: Sanda Klein, MD;  Location: [facility];  Service: Cardiovascular;  Laterality: N/A;   Patient Active Problem List   Diagnosis Date Noted   Bimalleolar fracture of left ankle 09/05/2021   Routine general medical examination at a health care facility 05/27/2018   Essential (primary) hypertension 05/20/2017   DDD (degenerative disc disease), lumbar 01/16/2011   RESTLESS LEG SYNDROME 06/07/2009   Hyperglycemia 06/07/2009   Hyperlipidemia 07/19/2007    REFERRING DIAG: Closed bimalleolar fracture of the Lt ankle, initial encounter  THERAPY DIAG:  Pain in left ankle and joints of left foot  Muscle weakness (generalized)  Other abnormalities of gait and mobility  PERTINENT  HISTORY: Osteopenia, lumbar DDD, Lt ankle fracture 08/31/21 due to fall down the steps  PRECAUTIONS: Pt is now WBAT in CAM boot  and wean to ASO per MD note.  SUBJECTIVE:  I did well walking on my trip.  I had to walk a lot at the airport and I was pretty sore the next day.   PAIN:  Are you having pain? No NPRS scale: 1/10 Pain location: Lt foot  Pain orientation: Left  PAIN TYPE: aching and tight Pain description: intermittent  Aggravating factors: standing, walking Relieving factors: rest, ice, ibuprofen    OBJECTIVE:    DIAGNOSTIC FINDINGS: 10/17/21: As of the left ankle obtained in 3 projections.  The previously identified  medial and lateral malleolar fractures are unchanged in position and  appear to be healing with opacification of the fracture lines and some  callus.  There is widening of the medial ankle mortise but no change from  prior films.  Approximately 1 to 2 mm of widening   PATIENT SURVEYS:  FOTO 32 (goal is 59)-evaluation FOTO: 68 (11/20/21)   COGNITION:           Overall cognitive status: Within functional limits for tasks assessed  SENSATION: WFL   POSTURE:  WFLs    PALPATION: Edema in Lt foot dorsum, reduced metatarsal mobility, palpable tenderness over bil ankle at malleolus.   LE ROM:   Active ROM Right 10/23/2021 Left 10/23/2021 Left 10/28/21  Hip flexion       Hip extension       Hip abduction       Hip adduction       Hip internal rotation       Hip external rotation       Knee flexion       Knee extension       Ankle dorsiflexion FULL 0   Ankle plantarflexion FULL full   Ankle inversion FULL Limited by 25%   Ankle eversion FULL Limited by 50% Limited less than 25%, eyeballs equal to RT   (Blank rows = not tested)   LE MMT:   MMT Right 10/23/2021 Left 10/23/2021 Left  11/20/21 Left 12/19/21  Hip flexion        Hip extension        Hip abduction        Hip adduction        Hip internal rotation        Hip  external rotation        Knee flexion        Knee extension        Ankle dorsiflexion   4/5 4+/5 5/5  Ankle plantarflexion   NT 4-/5 4-/5  Ankle inversion   4/5 4+/5 5/5  Ankle eversion   4-/5 4+/5 4+/5   (Blank rows = not tested)   GAIT: 11/20/21 Distance walked: 100 Assistive device utilized: cane on Rt Level of assistance: SBA Comments: minimal antalgia  Steps: alternating pattern ascending and step-to descending with use of 1 rail     TODAY'S TREATMENT: 01/02/22: Nustep L6 x 10 min with PT present to discuss progress Heel toe walking at counter top 4x fingertip touch Walking over hurdles and unlevel tiles forward and sidestepping  Tandem stance on blue balance pad 2x20 seconds Rt and Lt LAQ 5# 2x10 on Lt Weight shifting on balance pad x 3 ways 1 min each Tandem line walk and sidestepping on Airex balance beam in parallel bars x4 laps each Rockerboard x 3 minutes standing 12/25/21: Nustep L6 x 10 min with PT present to discuss progress Heel toe walking at counter top 4x fingertip touch 15# side stepping 10x each Walking over hurdles and unlevel tiles forward and sidestepping  Tandem stance on blue balance pad 2x20 seconds Rt and Lt LAQ 5# 2x10 on Lt Weight shift on mini tramp x 1 min each- with min to no UE support Rockerboard x 3 minutes standing  12/23/21: Nustep L6 x 10 min with PTA present to discuss progress Heel toe walking at counter top 4x fingertip touch 15# side stepping 10x each Lt LE with assit of Rt foot 25% x10 Tandem stance with blue plyo toss 10x Bil, 2x10 in tandem stance Tandem stance Bil with red band UE pumps 20 sec each side LAQ 5# 2x10 Weight shift on mini tramp x 1 min each- with min to no UE support 6" step downs with pt choice of UE support 2x10/ going down 4 steps 5x with 1 rail.  Rockerboard x 3 minutes seated ankle DF/PF VC to slow speed    PATIENT EDUCATION:  Education details: Access Code: [email or web address] Person educated: Patient Education  method: Explanation, Demonstration, and Handouts Education comprehension: verbalized understanding  and returned demonstration     HOME EXERCISE PROGRAM: Access Code: [email or web address] URL: [email or web address] Date: 10/23/2021 Prepared by: Claiborne Billings   Exercises Side to Side Weight Shift with Unilateral Counter Support - 3 x daily - 7 x weekly - 2 sets - 10 reps Stride Stance Weight Shift - 3 x daily - 7 x weekly - 2 sets - 10 reps Long Sitting Calf Stretch with Strap - 2 x daily - 7 x weekly - 10 reps - 10 hold Supine Ankle Inversion Eversion AROM - 4 x daily - 7 x weekly - 20 reps Supine Ankle Circles - 4 x daily - 7 x weekly - 20 reps Supine Ankle Dorsiflexion and Plantarflexion AROM - 4 x daily - 7 x weekly - 3 sets - 10 reps Seated Heel Toe Raises - 3 x daily - 7 x weekly - 2 sets - 10 reps Seated Long Arc Quad - 3 x daily - 7 x weekly - 2 sets - 10 reps Seated March - 3 x daily - 7 x weekly - 2 sets - 10 reps     ASSESSMENT:   CLINICAL IMPRESSION: Pt is making good progress s/p Lt ankle fracture.  Pt continues to work on balance and endurance to improve safety at home and in the community.  Pt is able to negotiate steps with alternating pattern ~75-90% of the time. Pt is now able to walk 30 minutes due to improved endurance.  Pt with mild antalgia due to limited Lt ankle functional strength and ROM.  Pt will attend 1 more session to finalize HEP before pt travels to [place].    OBJECTIVE IMPAIRMENTS Abnormal gait, decreased activity tolerance, decreased balance, decreased coordination, decreased endurance, difficulty walking, decreased ROM, decreased strength, impaired flexibility, and pain.    ACTIVITY LIMITATIONS community activity, meal prep, and shopping.    PERSONAL FACTORS Age and 1 comorbidity: Lt ankle fracture   are also affecting patient's functional outcome.      REHAB POTENTIAL: Excellent   CLINICAL DECISION MAKING: Stable/uncomplicated   EVALUATION COMPLEXITY:  Low  GOALS: Goals reviewed with patient? Yes   SHORT TERM GOALS: Target date: 11/20/21   Be independent in initial HEP Baseline:  Goal status: Goal met 10/25/21   2.  Ambulate with CAM boot or ASO with FWB with least restrictive device for all distances  Baseline: wearing ASO, 50% of the time, weightbearing FWB (11/06/21) Goal status: Goal met 11/11/21   3.  Negotiate steps with step-to gait and use of 1 rail with independence for increased ease with getting in/out of the house.   Baseline: alternating with ascending and step-to with descending Goal status: MET- 11/20/21   4. Improve strength and reduce pain to tolerate standing and walking for 10-20 minutes for independence with home tasks,            Goal status: Goal met 11/11/21   LONG TERM GOALS: Target date: 01/10/22   Be independent in advanced HEP Baseline: independent in current HEP, further advancement is needed.  Work on descending steps. Goal status: In progress (01/02/22)   2.  Improve FOTO to > or = to 59 Baseline: 68 11/20/21 Goal status: MET   3.  Wean from CAM boot to ASO without device for home and community distances  Baseline: CAM boot PWB Goal status: 11/18/21 met   4.  Demonstrate 5/5 Lt ankle strength to improve stability on unlevel surfaces in the community Baseline: 5/5 DF and inversion, 4-/5 PF, 4+/5 eversion (01/02/22)  Goal status: In progress   5.  Negotiate steps with step-over-step gait with 1 rail for independence with getting in/out of the house Baseline: step over step 75-90% of the time (01/02/22) Goal status: In progress   6.  Report < or = to 2/10 Lt LE pain with standing and walking to improve community independence  Baseline: no pain Goal status:MET  7. Improve endurance to walk > or = to 45 minutes without significant rest to improve endurance for her trip to [place]  Baseline: 30 minutes (01/02/22) Goal status:In progress      PLAN: PT FREQUENCY: 2x/week   PT DURATION: 3 weeks   PLANNED  INTERVENTIONS: Therapeutic exercises, Therapeutic activity, Neuromuscular re-education, Balance training, Gait training, Patient/Family education, Joint manipulation, Joint mobilization, Stair training, Dry Needling, Cryotherapy, Moist heat, Taping, Vasopneumatic device, and Manual therapy   PLAN FOR NEXT SESSION: Work on stability and endurance needed for safety with trip to [place], PT 01/02/22 8:44 AM     [facility] [address], [place],  [address] Phone # [phone] Fax [phone]

## 2022-01-06 ENCOUNTER — Ambulatory Visit (HOSPITAL_COMMUNITY)
Admission: RE | Admit: 2022-01-06 | Discharge: 2022-01-06 | Disposition: A | Discharge status: Home / Self Care | Payer: Medicare Other | Source: Ambulatory Visit | Attending: Family Medicine | Admitting: Family Medicine

## 2022-01-06 ENCOUNTER — Encounter (HOSPITAL_COMMUNITY): Payer: Self-pay

## 2022-01-06 VITALS — BP 153/60 | HR 88 | Temp 97.4°F | Resp 16

## 2022-01-06 DIAGNOSIS — Z0130 Encounter for examination of blood pressure without abnormal findings: Secondary | ICD-10-CM | POA: Diagnosis not present

## 2022-01-06 HISTORY — DX: Essential (primary) hypertension: I10

## 2022-01-06 NOTE — ED Provider Notes (Signed)
[place]    CSN: [id number] Arrival date & time: 01/06/22  1645      History   Chief Complaint Chief Complaint  Patient presents with   Hypertension    HPI Andrea Kelley is a 81 y.o. female.  History of HTN controlled with 50mg  losartan. Blurry vision yesterday, elevated BP. Checked today and it was 243/90. She thinks there might be something wrong with her home cuff. Denies headache, vision changes, blurry/double vision, chest pain or tightness, shortness of breath, abdominal pain, vomiting/diarrhea, weakness, numbness/tingling in extremities.   Past Medical History:  Diagnosis Date   DDD (degenerative disc disease), lumbar    Hyperlipidemia    Hypertension    Osteopenia    Andrea Kelley   Syncope     Patient Active Problem List   Diagnosis Date Noted   Bimalleolar fracture of left ankle 09/05/2021   Routine general medical examination at a health care facility 05/27/2018   Essential (primary) hypertension 05/20/2017   DDD (degenerative disc disease), lumbar 01/16/2011   RESTLESS LEG SYNDROME 06/07/2009   Hyperglycemia 06/07/2009   Hyperlipidemia 07/19/2007    Past Surgical History:  Procedure Laterality Date   ABDOMINAL HYSTERECTOMY     For dysfunctional bleeding; no BSO   BACK SURGERY  1970   ruptured disc ; Dr Quentin Cornwall   COLONOSCOPY  2001   Negative; done for rectal bleeding   LOOP RECORDER INSERTION N/A 02/16/2017   Procedure: Loop Recorder Insertion;  Surgeon: Sanda Klein, MD;  Location: [facility];  Service: Cardiovascular;  Laterality: N/A;    OB History   No obstetric history on file.      Home Medications    Prior to Admission medications   Medication Sig Start Date End Date Taking? Authorizing Provider  losartan (COZAAR) 50 MG tablet TAKE 1 TABLET BY MOUTH DAILY 10/28/21   Martinique, Betty G, MD  pravastatin (PRAVACHOL) 20 MG tablet TAKE 1 TABLET(20 MG) BY MOUTH DAILY 10/28/21   Martin[place], Betty G, MD  rOPINIRole (REQUIP) 1  MG tablet TAKE 1 TABLET BY MOUTH AROUND 7:00PM WHEN SITTING WATCHING TV 07/24/20   [place]ique, Betty G, MD  ropinirole (REQUIP) 5 MG tablet TAKE 1 TABLET(5 MG) BY MOUTH AT BEDTIME 10/16/21   [place], Betty G, MD    Family History Family History  Problem Relation Age of Onset   Lung cancer Father        smoker   Rheum arthritis Father    Heart attack Mother        59s   Stroke Mother        in 81s   Breast cancer Maternal Aunt    Diabetes Neg Hx     Social History Social History   Tobacco Use   Smoking status: Never   Smokeless tobacco: Never  Substance Use Topics   Alcohol use: Yes    Alcohol/week: 7.0 standard drinks    Types: 7 Glasses of wine per week   Drug use: No     Allergies   Sulfonamide derivatives   Review of Systems Review of Systems  As per HPI  Physical Exam Triage Vital Signs ED Triage Vitals  Enc Vitals Group     BP 01/06/22 1700 (!) 172/70     Pulse Rate 01/06/22 1700 88     Resp 01/06/22 1700 16     Temp 01/06/22 1700 (!) 97.4 F (36.3 C)     Temp Source 01/06/22 1700 Oral  SpO2 01/06/22 1700 96 %     Weight --      Height --      Head Circumference --      Peak Flow --      Pain Score 01/06/22 1701 0     Pain Loc --      Pain Edu? --      Excl. in [place]? --    No data found.  Updated Vital Signs BP (!) 153/60 (BP Location: Left Arm)   Pulse 88   Temp (!) 97.4 F (36.3 C) (Oral)   Resp 16   SpO2 96%     Physical Exam Vitals and nursing note reviewed.  Constitutional:      General: She is not in acute distress. HENT:     Head: Normocephalic and atraumatic.     Right Ear: Tympanic membrane and ear canal normal.     Left Ear: Tympanic membrane and ear canal normal.     Nose: Nose normal.     Mouth/Throat:     Mouth: Mucous membranes are moist.     Pharynx: Oropharynx is clear.  Eyes:     Extraocular Movements: Extraocular movements intact.     Conjunctiva/sclera: Conjunctivae normal.     Pupils: Pupils are equal, round,  and reactive to light.  Cardiovascular:     Rate and Rhythm: Normal rate and regular rhythm.     Heart sounds: Normal heart sounds.  Pulmonary:     Effort: Pulmonary effort is normal. No respiratory distress.     Breath sounds: Normal breath sounds.  Abdominal:     Palpations: Abdomen is soft.     Tenderness: There is no abdominal tenderness.  Musculoskeletal:        General: Normal range of motion.     Cervical back: Normal range of motion.  Neurological:     General: No focal deficit present.     Mental Status: She is alert and oriented to person, place, and time.     Cranial Nerves: No facial asymmetry.     Sensory: Sensation is intact. No sensory deficit.     Motor: Motor function is intact. No weakness.     Coordination: Coordination is intact. Coordination normal.     Gait: Gait is intact. Gait normal.     Comments: Strength 5/5 all extremities, full ROM, pulses and sensation intact bilat  Psychiatric:        Mood and Affect: Mood normal.     UC Treatments / Results  Labs (all labs ordered are listed, but only abnormal results are displayed) Labs Reviewed - No data to display  EKG  Radiology No results found.  Procedures Procedures (including critical care time)  Medications Ordered in UC Medications - No data to display  Initial Impression / Assessment and Plan / UC Course  I have reviewed the triage vital signs and the nursing notes.  Pertinent labs & imaging results that were available during my care of the patient were reviewed by me and considered in my medical decision making (see chart for details).   Pressure 172/70 in clinic today. Patient is asymptomatic, physical and neuro exam are unremarkable. She thinks her home machine is very old and may be malfunctioning. BP recheck 153/60. Discussed with patient to continue taking her medicine as prescribed. She should follow up with her primary care for med adjustment if needed. Strict return precautions  discussed. I believe patient is well and stable for discharge at this time.  Final Clinical Impressions(s) / UC Diagnoses   Final diagnoses:  BP check     Discharge Instructions      Continue taking your medication as prescribed.  I recommend follow up with your primary care provider.  Please return to the urgent care or emergency department if symptoms worsen or do not improve.     ED Prescriptions   None    PDMP not reviewed this encounter.   Shyasia Funches, Vernice Jefferson 01/06/22 1723

## 2022-01-06 NOTE — ED Triage Notes (Signed)
Pt states she had blurry vision yesterday so checked her BP and it was high . States she took it today and it was 243/90. Denies any visual changes today.

## 2022-01-06 NOTE — Discharge Instructions (Addendum)
Continue taking your medication as prescribed.  I recommend follow up with your primary care provider.  Please return to the urgent care or emergency department if symptoms worsen or do not improve.

## 2022-01-07 ENCOUNTER — Ambulatory Visit: Payer: Medicare Other

## 2022-01-07 DIAGNOSIS — R2689 Other abnormalities of gait and mobility: Secondary | ICD-10-CM

## 2022-01-07 DIAGNOSIS — M6281 Muscle weakness (generalized): Secondary | ICD-10-CM | POA: Diagnosis not present

## 2022-01-07 DIAGNOSIS — M25572 Pain in left ankle and joints of left foot: Secondary | ICD-10-CM | POA: Diagnosis not present

## 2022-01-07 NOTE — Progress Notes (Unsigned)
    ACUTE VISIT No chief complaint on file.  HPI: [name] is a 81 y.o. female, who is here today complaining of *** HPI  Review of Systems Rest see pertinent positives and negatives per HPI.  Current Outpatient Medications on File Prior to Visit  Medication Sig Dispense Refill   losartan (COZAAR) 50 MG tablet TAKE 1 TABLET BY MOUTH DAILY 90 tablet 2   pravastatin (PRAVACHOL) 20 MG tablet TAKE 1 TABLET(20 MG) BY MOUTH DAILY 90 tablet 2   rOPINIRole (REQUIP) 1 MG tablet TAKE 1 TABLET BY MOUTH AROUND 7:00PM WHEN SITTING WATCHING TV 30 tablet 2   ropinirole (REQUIP) 5 MG tablet TAKE 1 TABLET(5 MG) BY MOUTH AT BEDTIME 90 tablet 2   No current facility-administered medications on file prior to visit.     Past Medical History:  Diagnosis Date   DDD (degenerative disc disease), lumbar    Hyperlipidemia    Hypertension    Osteopenia    [place]   Syncope    Allergies  Allergen Reactions   Sulfonamide Derivatives Rash    REACTION: rash as child    Social History   Socioeconomic History   Marital status: Widowed    Spouse name: Not on file   Number of children: Not on file   Years of education: Not on file   Highest education level: Master's degree (e.g., MA, MS, MEng, MEd, MSW, MBA)  Occupational History   Occupation: Retired Landscape architect, trained in Facilities manager therapy  Tobacco Use   Smoking status: Never   Smokeless tobacco: Never  Substance and Sexual Activity   Alcohol use: Yes    Alcohol/week: 7.0 standard drinks    Types: 7 Glasses of wine per week   Drug use: No   Sexual activity: Not on file  Other Topics Concern   Not on file  Social History Narrative   Lives w/ Husband in [place]   Social Determinants of Health   Financial Resource Strain: Low Risk    Difficulty of Paying Living Expenses: Not hard at all  Food Insecurity: No Food Insecurity   Worried About Programme researcher, broadcasting/film/video in the Last Year: Never true   Barista in the  Last Year: Never true  Transportation Needs: No Transportation Needs   Lack of Transportation (Medical): No   Lack of Transportation (Non-Medical): No  Physical Activity: Unknown   Days of Exercise per Week: Patient refused   Minutes of Exercise per Session: 0 min  Stress: No Stress Concern Present   Feeling of Stress : Not at all  Social Connections: Unknown   Frequency of Communication with Friends and Family: More than three times a week   Frequency of Social Gatherings with Friends and Family: Patient refused   Attends Religious Services: Patient refused   Active Member of Clubs or Organizations: Patient refused   Attends Banker Meetings: Never   Marital Status: Widowed    There were no vitals filed for this visit. There is no height or weight on file to calculate BMI.  Physical Exam  ASSESSMENT AND PLAN:  There are no diagnoses linked to this encounter.   No follow-ups on file.   Yasmin Dibello G. [place], MD  [facility]. [place] office.  Discharge Instructions   None

## 2022-01-07 NOTE — Therapy (Addendum)
OUTPATIENT PHYSICAL THERAPY TREATMENT NOTE   Patient Name: Andrea Kelley MRN: [id number] DOB:1941/02/27, 81 y.o., female Today's Date: 01/07/2022  PCP: Martinique, Betty G, MD REFERRING PROVIDER: Persons, Audrea Muscat, [place]         PT End of Session - 01/07/22 0852     Visit Number 21    PT Start Time 0846    PT Stop Time 0923    PT Time Calculation (min) 37 min    Activity Tolerance Patient tolerated treatment well    Behavior During Therapy [facility] for tasks assessed/performed                             Past Medical History:  Diagnosis Date   DDD (degenerative disc disease), lumbar    Hyperlipidemia    Hypertension    Osteopenia    [place]   Syncope    Past Surgical History:  Procedure Laterality Date   ABDOMINAL HYSTERECTOMY     For dysfunctional bleeding; no BSO   BACK SURGERY  1970   ruptured disc ; Dr Quentin Cornwall   COLONOSCOPY  2001   Negative; done for rectal bleeding   LOOP RECORDER INSERTION N/A 02/16/2017   Procedure: Loop Recorder Insertion;  Surgeon: Sanda Klein, MD;  Location: [facility];  Service: Cardiovascular;  Laterality: N/A;   Patient Active Problem List   Diagnosis Date Noted   Bimalleolar fracture of left ankle 09/05/2021   Routine general medical examination at a health care facility 05/27/2018   Essential (primary) hypertension 05/20/2017   DDD (degenerative disc disease), lumbar 01/16/2011   RESTLESS LEG SYNDROME 06/07/2009   Hyperglycemia 06/07/2009   Hyperlipidemia 07/19/2007    REFERRING DIAG: Closed bimalleolar fracture of the Lt ankle, initial encounter  THERAPY DIAG:  Pain in left ankle and joints of left foot  Muscle weakness (generalized)  Other abnormalities of gait and mobility  PERTINENT HISTORY: Osteopenia, lumbar DDD, Lt ankle fracture 08/31/21 due to fall down the steps  PRECAUTIONS: Pt is now WBAT in CAM boot  and wean to ASO per MD note.  SUBJECTIVE:  I am ready to D/C.  Steps are  getting easier each day.   PAIN:  Are you having pain? No NPRS scale: 1/10 Pain location: Lt foot  Pain orientation: Left  PAIN TYPE: aching and tight Pain description: intermittent  Aggravating factors: standing, walking Relieving factors: rest, ice, ibuprofen    OBJECTIVE:    DIAGNOSTIC FINDINGS: 10/17/21: As of the left ankle obtained in 3 projections.  The previously identified  medial and lateral malleolar fractures are unchanged in position and  appear to be healing with opacification of the fracture lines and some  callus.  There is widening of the medial ankle mortise but no change from  prior films.  Approximately 1 to 2 mm of widening   PATIENT SURVEYS:  FOTO 32 (goal is 59)-evaluation FOTO: 68 (11/20/21)   COGNITION:           Overall cognitive status: Within functional limits for tasks assessed                          SENSATION: WFL   POSTURE:  WFLs    PALPATION: Edema in Lt foot dorsum, reduced metatarsal mobility, palpable tenderness over bil ankle at malleolus.   LE ROM:   Active ROM Right 10/23/2021 Left 10/23/2021 Left 10/28/21  Hip flexion  Hip extension       Hip abduction       Hip adduction       Hip internal rotation       Hip external rotation       Knee flexion       Knee extension       Ankle dorsiflexion FULL 0   Ankle plantarflexion FULL full   Ankle inversion FULL Limited by 25%   Ankle eversion FULL Limited by 50% Limited less than 25%, eyeballs equal to RT   (Blank rows = not tested)   LE MMT:   MMT Right 10/23/2021 Left 10/23/2021 Left  11/20/21 Left 12/19/21 Left  01/07/22  Hip flexion         Hip extension         Hip abduction         Hip adduction         Hip internal rotation         Hip external rotation         Knee flexion         Knee extension         Ankle dorsiflexion   4/5 4+/5 5/5 5/5  Ankle plantarflexion   NT 4-/5 4-/5 4-/5  Ankle inversion   4/5 4+/5 5/5 5/5  Ankle eversion   4-/5 4+/5 4+/5 4+/5    (Blank rows = not tested)   GAIT: 11/20/21 Distance walked: 100 Assistive device utilized: cane on Rt Level of assistance: SBA Comments: minimal antalgia  Steps: alternating pattern ascending and step-to descending with use of 1 rail     TODAY'S TREATMENT: 01/07/22: Nustep L6 x 10 min with PT present to discuss progress Walking over hurdles and unlevel tiles forward and sidestepping  Tandem stance on blue balance pad 2x20 seconds Rt and Lt LAQ 5# 2x10 on Lt Weight shifting on balance pad x 3 ways 1 min each Tandem line walk and sidestepping on Airex balance beam in parallel bars x4 laps each Rockerboard x 3 minutes standing  01/02/22: Nustep L6 x 10 min with PT present to discuss progress Heel toe walking at counter top 4x fingertip touch Walking over hurdles and unlevel tiles forward and sidestepping  Tandem stance on blue balance pad 2x20 seconds Rt and Lt LAQ 5# 2x10 on Lt Weight shifting on balance pad x 3 ways 1 min each Tandem line walk and sidestepping on Airex balance beam in parallel bars x4 laps each Rockerboard x 3 minutes standing 12/25/21: Nustep L6 x 10 min with PT present to discuss progress Heel toe walking at counter top 4x fingertip touch 15# side stepping 10x each Walking over hurdles and unlevel tiles forward and sidestepping  Tandem stance on blue balance pad 2x20 seconds Rt and Lt LAQ 5# 2x10 on Lt Weight shift on mini tramp x 1 min each- with min to no UE support Rockerboard x 3 minutes standing    PATIENT EDUCATION:  Education details: Access Code: [email or web address] Person educated: Patient Education method: Explanation, Demonstration, and Handouts Education comprehension: verbalized understanding and returned demonstration     HOME EXERCISE PROGRAM: Access Code: [email or web address] URL: [email or web address] Date: 10/23/2021 Prepared by: Claiborne Billings   Exercises Side to Side Weight Shift with Unilateral Counter Support - 3 x daily - 7 x weekly - 2 sets -  10 reps Stride Stance Weight Shift - 3 x daily - 7 x weekly - 2 sets - 10 reps Long Sitting Calf Stretch with Strap - 2  x daily - 7 x weekly - 10 reps - 10 hold Supine Ankle Inversion Eversion AROM - 4 x daily - 7 x weekly - 20 reps Supine Ankle Circles - 4 x daily - 7 x weekly - 20 reps Supine Ankle Dorsiflexion and Plantarflexion AROM - 4 x daily - 7 x weekly - 3 sets - 10 reps Seated Heel Toe Raises - 3 x daily - 7 x weekly - 2 sets - 10 reps Seated Long Arc Quad - 3 x daily - 7 x weekly - 2 sets - 10 reps Seated March - 3 x daily - 7 x weekly - 2 sets - 10 reps     ASSESSMENT:   CLINICAL IMPRESSION: Pt will D/C today to HEP.  Pt continues to work on balance and endurance to improve safety at home and in the community.  Pt is able to negotiate steps with alternating pattern ~75-90% of the time. Pt is now able to walk 30 minutes due to improved endurance.  Pt with mild antalgia due to limited Lt ankle functional strength and ROM.  Pt will take her walking stick with her to [facility] [facility] for safety and stability.      GOALS: Goals reviewed with patient? Yes   SHORT TERM GOALS: Target date: 11/20/21   Be independent in initial HEP Baseline:  Goal status: Goal met 10/25/21   2.  Ambulate with CAM boot or ASO with FWB with least restrictive device for all distances  Baseline: wearing ASO, 50% of the time, weightbearing FWB (11/06/21) Goal status: Goal met 11/11/21   3.  Negotiate steps with step-to gait and use of 1 rail with independence for increased ease with getting in/out of the house.   Baseline: alternating with ascending and step-to with descending Goal status: MET- 11/20/21   4. Improve strength and reduce pain to tolerate standing and walking for 10-20 minutes for independence with home tasks,            Goal status: Goal met 11/11/21   LONG TERM GOALS: Target date: 01/10/22   Be independent in advanced HEP Baseline: independent in current HEP Goal status: Met   2.  Improve FOTO to >  or = to 59 Baseline: 68 11/20/21 Goal status: MET   3.  Wean from CAM boot to ASO without device for home and community distances  Baseline: CAM boot PWB Goal status: 11/18/21 met   4.  Demonstrate 5/5 Lt ankle strength to improve stability on unlevel surfaces in the community Baseline: 5/5 DF and inversion, 4-/5 PF, 4+/5 eversion (01/02/22) Goal status: Partially met  5.  Negotiate steps with step-over-step gait with 1 rail for independence with getting in/out of the house Baseline: step over step 75-90% of the time (01/02/22) Goal status: Partially met    6.  Report < or = to 2/10 Lt LE pain with standing and walking to improve community independence  Baseline: no pain Goal status:MET  7. Improve endurance to walk > or = to 45 minutes without significant rest to improve endurance for her trip to [place]: 30 minutes (01/02/22) Goal status:Partially met     PLAN: D/C PT to HEP today PHYSICAL THERAPY DISCHARGE SUMMARY  Visits from Start of Care: 21  Current functional level related to goals / functional outcomes: See above for current status.    Remaining deficits: Mild antalgia and some difficulty and pain with descending steps.  Pt is continuing to work on this at home.  Education / Equipment: HEP, fall prevention   Patient agrees to discharge. Patient goals were partially met. Patient is being discharged due to being pleased with the current functional level.   Sigurd Sos, PT 01/07/22 9:29 AM     [facility] [address], [place], [place] [address] Phone # [phone] Fax [phone]

## 2022-01-08 ENCOUNTER — Encounter: Payer: Self-pay | Admitting: Family Medicine

## 2022-01-08 ENCOUNTER — Other Ambulatory Visit: Payer: Self-pay | Admitting: Family Medicine

## 2022-01-08 ENCOUNTER — Ambulatory Visit (INDEPENDENT_AMBULATORY_CARE_PROVIDER_SITE_OTHER): Payer: Medicare Other | Admitting: Family Medicine

## 2022-01-08 VITALS — BP 138/90 | HR 84 | Resp 16 | Ht 65.0 in | Wt 156.2 lb

## 2022-01-08 DIAGNOSIS — I10 Essential (primary) hypertension: Secondary | ICD-10-CM | POA: Diagnosis not present

## 2022-01-08 LAB — COMPREHENSIVE METABOLIC PANEL
ALT: 15 U/L (ref 0–35)
AST: 17 U/L (ref 0–37)
Albumin: 4.3 g/dL (ref 3.5–5.2)
Alkaline Phosphatase: 99 U/L (ref 39–117)
BUN: 18 mg/dL (ref 6–23)
CO2: 23 mEq/L (ref 19–32)
Calcium: 9.4 mg/dL (ref 8.4–10.5)
Chloride: 106 mEq/L (ref 96–112)
Creatinine, Ser: 0.77 mg/dL (ref 0.40–1.20)
GFR: 72.11 mL/min (ref >60.00)
Glucose, Bld: 95 mg/dL (ref 70–99)
Potassium: 4 mEq/L (ref 3.5–5.1)
Sodium: 140 mEq/L (ref 135–145)
Total Bilirubin: 0.6 mg/dL (ref 0.2–1.2)
Total Protein: 7.2 g/dL (ref 6.0–8.3)

## 2022-01-08 LAB — TSH: TSH: 1.74 u[IU]/mL (ref 0.35–5.50)

## 2022-01-08 MED ORDER — AMLODIPINE BESYLATE 5 MG PO TABS: 5.0000 mg | ORAL_TABLET | Freq: Every day | ORAL | 1 refills | Status: DC

## 2022-01-08 NOTE — Assessment & Plan Note (Signed)
Re-checked 160/100 and 160/95 LUE. BP is not well controlled. Possible complications of elevated BP discussed. We discussed possible options, she agrees with adding another agent, Amlodipine 5 mg, 1/2 tab daily at bedtime. Continue Losartan 50 mg daily in the morning. Low salt diet. Continue monitoring BP at home, appropriate technique discussed. Instructed about warning signs. Follow-up in 4 weeks, before if needed.

## 2022-01-08 NOTE — Patient Instructions (Addendum)
A few things to remember from today's visit:  Essential (primary) hypertension - Plan: Comprehensive metabolic panel, TSH, amLODipine (NORVASC) 5 MG tablet, DISCONTINUED: amLODipine (NORVASC) 5 MG tablet  If you need refills please call your pharmacy. Do not use My Chart to request refills or for acute issues that need immediate attention.   No changes in Losartan, taking in the morning. Amlodipine 5 mg 1/2 tab at bedtime. Continue monitoring blood pressure.  Please be sure medication list is accurate. If a new problem present, please set up appointment sooner than planned today.

## 2022-01-11 ENCOUNTER — Encounter: Payer: Self-pay | Admitting: Family Medicine

## 2022-01-24 ENCOUNTER — Encounter: Payer: Self-pay | Admitting: Family Medicine

## 2022-01-24 ENCOUNTER — Encounter: Payer: Self-pay | Admitting: Family

## 2022-01-24 ENCOUNTER — Telehealth (INDEPENDENT_AMBULATORY_CARE_PROVIDER_SITE_OTHER): Payer: Medicare Other | Admitting: Family

## 2022-01-24 DIAGNOSIS — U071 COVID-19: Secondary | ICD-10-CM | POA: Diagnosis not present

## 2022-01-24 DIAGNOSIS — R051 Acute cough: Secondary | ICD-10-CM

## 2022-01-24 MED ORDER — BENZONATATE 100 MG PO CAPS: 100.0000 mg | ORAL_CAPSULE | Freq: Three times a day (TID) | ORAL | 0 refills | Status: DC | PRN

## 2022-01-28 NOTE — Progress Notes (Signed)
   Virtual Visit via Video   I connected with patient on 01/24/22 at  1:30 PM EDT by a video enabled telemedicine application and verified that I am speaking with the correct person using two identifiers.  Location patient: Home Location provider: [facility], Office Persons participating in the virtual visit: Patient, Provider Hyman Hopes, CMA  I discussed the limitations of evaluation and management by telemedicine and the availability of in person appointments. The patient expressed understanding and agreed to proceed.  Subjective:   HPI:   81 year old female presents via video visit with concerns of stuffy nose, runny nose, and cough after returning from a trip to [place]. One of the people she traveled with tested positive for COVID so she decided to test with a rapid antigen test and is also positive. She feels ok overall. No chest pain or SOB.  ROS:   See pertinent positives and negatives per HPI.  Patient Active Problem List   Diagnosis Date Noted   Bimalleolar fracture of left ankle 09/05/2021   Routine general medical examination at a health care facility 05/27/2018   Essential (primary) hypertension 05/20/2017   DDD (degenerative disc disease), lumbar 01/16/2011   RESTLESS LEG SYNDROME 06/07/2009   Hyperglycemia 06/07/2009   Hyperlipidemia 07/19/2007    Social History   Tobacco Use   Smoking status: Never   Smokeless tobacco: Never  Substance Use Topics   Alcohol use: Yes    Alcohol/week: 7.0 standard drinks of alcohol    Types: 7 Glasses of wine per week    Current Outpatient Medications:    amLODipine (NORVASC) 5 MG tablet, TAKE 1 TABLET(5 MG) BY MOUTH DAILY, Disp: 90 tablet, Rfl: 0   benzonatate (TESSALON) 100 MG capsule, Take 1 capsule (100 mg total) by mouth 3 (three) times daily as needed for cough., Disp: 20 capsule, Rfl: 0   losartan (COZAAR) 50 MG tablet, TAKE 1 TABLET BY MOUTH DAILY, Disp: 90 tablet, Rfl: 2   pravastatin (PRAVACHOL) 20 MG tablet, TAKE  1 TABLET(20 MG) BY MOUTH DAILY, Disp: 90 tablet, Rfl: 2   ropinirole (REQUIP) 5 MG tablet, TAKE 1 TABLET(5 MG) BY MOUTH AT BEDTIME, Disp: 90 tablet, Rfl: 2  Allergies  Allergen Reactions   Sulfonamide Derivatives Rash    REACTION: rash as child    Objective:   There were no vitals taken for this visit.  Patient is well-developed, well-nourished in no acute distress.  Resting comfortably at home.  Head is normocephalic, atraumatic.  No labored breathing.  Speech is clear and coherent with logical content.  Patient is alert and oriented at baseline.    Assessment and Plan:  Jency was seen today for covid positive and cough.  Diagnoses and all orders for this visit:  COVID-19  Acute cough  Other orders -     benzonatate (TESSALON) 100 MG capsule; Take 1 capsule (100 mg total) by mouth 3 (three) times daily as needed for cough.    Call the office if symptoms worsen or persist. Recheck as scheduled and as needed. Declined Paxlovid.  Eulis Foster, FNP 01/28/2022  Time spent with the patient: 25 minutes, of which >50% was spent in obtaining information about symptoms, reviewing previous labs, evaluations, and treatments, counseling about condition (please see the discussed topics above), and developing a plan to further investigate it; had a number of questions which I addressed.

## 2022-02-03 ENCOUNTER — Encounter (INDEPENDENT_AMBULATORY_CARE_PROVIDER_SITE_OTHER): Payer: Medicare Other | Admitting: Ophthalmology

## 2022-02-07 ENCOUNTER — Encounter (INDEPENDENT_AMBULATORY_CARE_PROVIDER_SITE_OTHER): Payer: Medicare Other | Admitting: Ophthalmology

## 2022-02-07 DIAGNOSIS — H353112 Nonexudative age-related macular degeneration, right eye, intermediate dry stage: Secondary | ICD-10-CM

## 2022-02-07 DIAGNOSIS — H43813 Vitreous degeneration, bilateral: Secondary | ICD-10-CM

## 2022-02-07 DIAGNOSIS — H353221 Exudative age-related macular degeneration, left eye, with active choroidal neovascularization: Secondary | ICD-10-CM

## 2022-02-07 DIAGNOSIS — I10 Essential (primary) hypertension: Secondary | ICD-10-CM

## 2022-02-07 DIAGNOSIS — H35033 Hypertensive retinopathy, bilateral: Secondary | ICD-10-CM

## 2022-03-11 NOTE — Progress Notes (Signed)
Andrea Kelley is a 81 y.o.female, who is here today to follow on HTN.  Last follow up visit: 01/08/22 Hypertension:  Medications: Amlodipine 5 mg daily and Losartan 50 mg daily. BP readings at home:130-140/?, she does not remember DBP's. Occasionally SBP > 140. Side effects:None. Negative for unusual or severe headache, visual changes, exertional chest pain, dyspnea,  focal weakness, or edema.  Lab Results  Component Value Date   CREATININE 0.77 01/08/2022   BUN 18 01/08/2022   NA 140 01/08/2022   K 4.0 01/08/2022   CL 106 01/08/2022   CO2 23 01/08/2022   Dx'ed with COVID 19 infection 01/24/22, symptoms have resolved except for mild cough. Right rib cage exacerbated by coughing. No pain with palpation and has not noted skin rash.  Negative for wheezing or SOB. No associated abdominal pain,nausea,vomiting,or changes in bowel habits.  RLS: She is on Requip 5 mg at bedtime and takes 1 mg during the day as needed when she has leg discomfort upon prolonged sitting. Tolerating medication well. Negative for LE erythema,edema,or pain.  Insomnia: Having difficulty staying asleep. Problem has been intermittently, about 1-2 times per week and for a year. She has not identified exacerbating or alleviating factors. OTC treatment have not helped. She has not tried melatonin.  Review of Systems  Constitutional:  Negative for activity change, appetite change and fever.  HENT:  Negative for mouth sores, nosebleeds and sore throat.   Genitourinary:  Negative for decreased urine volume and hematuria.  Neurological:  Negative for syncope and facial asymmetry.  Psychiatric/Behavioral:  Positive for sleep disturbance. Negative for confusion.   Rest see pertinent positives and negatives per HPI.  Current Outpatient Medications on File Prior to Visit  Medication Sig Dispense Refill   amLODipine (NORVASC) 5 MG tablet TAKE 1 TABLET(5 MG) BY MOUTH DAILY 90 tablet 0   losartan (COZAAR) 50 MG  tablet TAKE 1 TABLET BY MOUTH DAILY 90 tablet 2   pravastatin (PRAVACHOL) 20 MG tablet TAKE 1 TABLET(20 MG) BY MOUTH DAILY 90 tablet 2   ropinirole (REQUIP) 5 MG tablet TAKE 1 TABLET(5 MG) BY MOUTH AT BEDTIME 90 tablet 2   No current facility-administered medications on file prior to visit.   Past Medical History:  Diagnosis Date   DDD (degenerative disc disease), lumbar    Hyperlipidemia    Hypertension    Osteopenia    [place] [facility]   Syncope    Allergies  Allergen Reactions   Sulfonamide Derivatives Rash    REACTION: rash as child   Social History   Socioeconomic History   Marital status: Widowed    Spouse name: Not on file   Number of children: Not on file   Years of education: Not on file   Highest education level: Master's degree (e.g., MA, MS, MEng, MEd, MSW, MBA)  Occupational History   Occupation: Retired Landscape architect, trained in Facilities manager therapy  Tobacco Use   Smoking status: Never   Smokeless tobacco: Never  Substance and Sexual Activity   Alcohol use: Yes    Alcohol/week: 7.0 standard drinks of alcohol    Types: 7 Glasses of wine per week   Drug use: No   Sexual activity: Not on file  Other Topics Concern   Not on file  Social History Narrative   Lives w/ Husband in [place]   Social Determinants of Health   Financial Resource Strain: Low Risk  (01/07/2022)   Overall Financial Resource Strain (CARDIA)    Difficulty of  Paying Living Expenses: Not hard at all  Food Insecurity: No Food Insecurity (01/07/2022)   Hunger Vital Sign    Worried About Running Out of Food in the Last Year: Never true    Ran Out of Food in the Last Year: Never true  Transportation Needs: No Transportation Needs (01/07/2022)   PRAPARE - Hydrologist (Medical): No    Lack of Transportation (Non-Medical): No  Physical Activity: Unknown (01/07/2022)   Exercise Vital Sign    Days of Exercise per Week: Patient refused    Minutes of Exercise per  Session: 0 min  Stress: No Stress Concern Present (01/07/2022)   [place]    Feeling of Stress : Not at all  Social Connections: Unknown (01/07/2022)   Social Connection and Isolation Panel [NHANES]    Frequency of Communication with Friends and Family: More than three times a week    Frequency of Social Gatherings with Friends and Family: Patient refused    Attends Religious Services: Patient refused    Active Member of Clubs or Organizations: Patient refused    Attends Archivist Meetings: Never    Marital Status: Widowed   Vitals:   03/12/22 0750  BP: 120/70  Pulse: 87  Resp: 12  SpO2: 99%  Body mass index is 25.67 kg/m.  Physical Exam Vitals and nursing note reviewed.  Constitutional:      General: She is not in acute distress.    Appearance: She is well-developed.  HENT:     Head: Normocephalic and atraumatic.     Mouth/Throat:     Mouth: Mucous membranes are moist.     Pharynx: Oropharynx is clear.  Eyes:     Conjunctiva/sclera: Conjunctivae normal.  Cardiovascular:     Rate and Rhythm: Normal rate and regular rhythm.     Pulses:          Dorsalis pedis pulses are 2+ on the right side and 2+ on the left side.     Heart sounds: No murmur heard. Pulmonary:     Effort: Pulmonary effort is normal. No respiratory distress.     Breath sounds: Normal breath sounds.  Chest:     Chest wall: No tenderness or crepitus.  Abdominal:     Palpations: Abdomen is soft. There is no hepatomegaly or mass.     Tenderness: There is no abdominal tenderness.  Lymphadenopathy:     Cervical: No cervical adenopathy.  Skin:    General: Skin is warm.     Findings: No erythema or rash.  Neurological:     General: No focal deficit present.     Mental Status: She is alert and oriented to person, place, and time.     Cranial Nerves: No cranial nerve deficit.     Gait: Gait normal.  Psychiatric:     Comments:  Well groomed, good eye contact.   ASSESSMENT AND PLAN:   AndreaEmary was seen today for hypertension.  Diagnoses and all orders for this visit:  Costal margin pain Right rib cage with cough. Hx suggest muscular pain, ? Muscle strain. I do not think imaging is needed at this time. Monitor for new symptoms.  Essential (primary) hypertension BP adequately controlled. Re-checked 135/80. Continue current management: Losartan and Amlodipine same dose. DASH/low salt diet to continue. Continue monitoring BP at home. Eye exam is current.  RESTLESS LEG SYNDROME Problem is stable. Continue Requip 5 mg daily at bedtime and  1 mg 0.5-1 tab daily as needed during the day.  Insomnia We discussed treatment options, including melatonin,Trazodone,and Doxepin. She would like to try Melatonin first, 5-15 mg daily, 2 hours before bedtime and with empty stomach. She will let me know if Melatonin does not help, we will consider Doxepin. Good sleep hygiene to continue.  Return in about 6 months (around 09/12/2022).  Luvinia Lucy G. [place], MD  [facility]. [place] office.

## 2022-03-12 ENCOUNTER — Other Ambulatory Visit: Payer: Self-pay | Admitting: Family Medicine

## 2022-03-12 ENCOUNTER — Encounter: Payer: Self-pay | Admitting: Family Medicine

## 2022-03-12 ENCOUNTER — Ambulatory Visit (INDEPENDENT_AMBULATORY_CARE_PROVIDER_SITE_OTHER): Payer: Medicare Other | Admitting: Family Medicine

## 2022-03-12 VITALS — BP 120/70 | HR 87 | Resp 12 | Ht 65.0 in | Wt 154.2 lb

## 2022-03-12 DIAGNOSIS — I10 Essential (primary) hypertension: Secondary | ICD-10-CM

## 2022-03-12 DIAGNOSIS — R0781 Pleurodynia: Secondary | ICD-10-CM | POA: Diagnosis not present

## 2022-03-12 DIAGNOSIS — G2581 Restless legs syndrome: Secondary | ICD-10-CM | POA: Diagnosis not present

## 2022-03-12 DIAGNOSIS — G4700 Insomnia, unspecified: Secondary | ICD-10-CM

## 2022-03-12 MED ORDER — ROPINIROLE HCL 1 MG PO TABS: ORAL_TABLET | ORAL | 1 refills | Status: DC

## 2022-03-12 NOTE — Patient Instructions (Addendum)
A few things to remember from today's visit:  Essential (primary) hypertension  RESTLESS LEG SYNDROME  Insomnia, unspecified type  If you need refills please call your pharmacy. Do not use My Chart to request refills or for acute issues that need immediate attention.   Try Melatonin 5-15 mg 2 hours before bedtime and with empty stomach as well as good sleep hygiene. If it does not help,we can try Doxepin. No changes today.  Please be sure medication list is accurate. If a new problem present, please set up appointment sooner than planned today.

## 2022-03-12 NOTE — Assessment & Plan Note (Signed)
Problem is stable. Continue Requip 5 mg daily at bedtime and 1 mg 0.5-1 tab daily as needed during the day.

## 2022-03-12 NOTE — Assessment & Plan Note (Signed)
We discussed treatment options, including melatonin,Trazodone,and Doxepin. She would like to try Melatonin first, 5-15 mg daily, 2 hours before bedtime and with empty stomach. She will let me know if Melatonin does not help, we will consider Doxepin. Good sleep hygiene to continue.

## 2022-03-12 NOTE — Assessment & Plan Note (Signed)
BP adequately controlled. Re-checked 135/80. Continue current management: Losartan and Amlodipine same dose. DASH/low salt diet to continue. Continue monitoring BP at home. Eye exam is current.

## 2022-04-08 ENCOUNTER — Other Ambulatory Visit: Payer: Self-pay | Admitting: Family Medicine

## 2022-04-08 DIAGNOSIS — I10 Essential (primary) hypertension: Secondary | ICD-10-CM

## 2022-04-12 ENCOUNTER — Other Ambulatory Visit: Payer: Self-pay | Admitting: Family Medicine

## 2022-04-12 DIAGNOSIS — E782 Mixed hyperlipidemia: Secondary | ICD-10-CM

## 2022-04-25 ENCOUNTER — Ambulatory Visit: Payer: Medicare Other | Admitting: Family Medicine

## 2022-05-26 DIAGNOSIS — H524 Presbyopia: Secondary | ICD-10-CM | POA: Diagnosis not present

## 2022-05-26 DIAGNOSIS — Z961 Presence of intraocular lens: Secondary | ICD-10-CM | POA: Diagnosis not present

## 2022-05-26 DIAGNOSIS — H353132 Nonexudative age-related macular degeneration, bilateral, intermediate dry stage: Secondary | ICD-10-CM | POA: Diagnosis not present

## 2022-05-26 DIAGNOSIS — H43813 Vitreous degeneration, bilateral: Secondary | ICD-10-CM | POA: Diagnosis not present

## 2022-05-26 DIAGNOSIS — H11153 Pinguecula, bilateral: Secondary | ICD-10-CM | POA: Diagnosis not present

## 2022-06-09 ENCOUNTER — Ambulatory Visit (INDEPENDENT_AMBULATORY_CARE_PROVIDER_SITE_OTHER): Payer: Medicare Other

## 2022-06-09 VITALS — BP 128/64 | HR 65 | Temp 97.7°F | Ht 65.0 in | Wt 158.6 lb

## 2022-06-09 DIAGNOSIS — Z0000 Encounter for general adult medical examination without abnormal findings: Secondary | ICD-10-CM | POA: Diagnosis not present

## 2022-06-09 NOTE — Progress Notes (Signed)
Subjective:   Andrea Kelley is a 81 y.o. female who presents for Medicare Annual (Subsequent) preventive examination.  Review of Systems     Cardiac Risk Factors include: advanced age (>65men, >58 women);hypertension     Objective:    Today's Vitals   06/09/22 1001  BP: 128/64  Pulse: 65  Temp: 97.7 F (36.5 C)  TempSrc: Oral  SpO2: 95%  Weight: 158 lb 9.6 oz (71.9 kg)  Height: 5\' 5"  (1.651 m)   Body mass index is 26.39 kg/m.     06/09/2022   10:15 AM 10/23/2021    8:02 AM 06/06/2021    9:06 AM 09/10/2020    8:59 AM 03/27/2020    8:37 AM 11/17/2019    4:04 PM 06/23/2019    9:32 AM  Advanced Directives  Does Patient Have a Medical Advance Directive? Yes Yes Yes Yes Yes Yes Yes  Type of 13/07/2019 of [place];Living will Healthcare Power of [place];Living will Healthcare Power of [place];Living will Healthcare Power of [place];Living will Healthcare Power of [place];Living will Healthcare Power of [place];Living will Healthcare Power of [place];Living will  Does patient want to make changes to medical advance directive?  No - Patient declined  No - Patient declined No - Patient declined No - Patient declined   Copy of Healthcare Power of Attorney in Chart? No - copy requested No - copy requested No - copy requested No - copy requested No - copy requested No - copy requested No - copy requested  Would patient like information on creating a medical advance directive?  No - Guardian declined         Current Medications (verified) Outpatient Encounter Medications as of 06/09/2022  Medication Sig   amLODipine (NORVASC) 5 MG tablet TAKE 1 TABLET(5 MG) BY MOUTH DAILY   losartan (COZAAR) 50 MG tablet TAKE 1 TABLET BY MOUTH DAILY   pravastatin (PRAVACHOL) 20 MG tablet TAKE 1 TABLET BY MOUTH EVERY DAY   rOPINIRole (REQUIP) 1 MG tablet TAKE 1/2 TO 1 TABLET BY MOUTH DAILY AS NEEDED   ropinirole (REQUIP) 5 MG tablet TAKE 1 TABLET(5 MG) BY MOUTH AT  BEDTIME   No facility-administered encounter medications on file as of 06/09/2022.    Allergies (verified) Sulfonamide derivatives   History: Past Medical History:  Diagnosis Date   DDD (degenerative disc disease), lumbar    Hyperlipidemia    Hypertension    Osteopenia    [place] [facility]   Syncope    Past Surgical History:  Procedure Laterality Date   ABDOMINAL HYSTERECTOMY     For dysfunctional bleeding; no BSO   BACK SURGERY  1970   ruptured disc ; Dr 06/11/2022   COLONOSCOPY  2001   Negative; done for rectal bleeding   LOOP RECORDER INSERTION N/A 02/16/2017   Procedure: Loop Recorder Insertion;  Surgeon: 04/19/2017, MD;  Location: [facility];  Service: Cardiovascular;  Laterality: N/A;   Family History  Problem Relation Age of Onset   Lung cancer Father        smoker   Rheum arthritis Father    Heart attack Mother        54s   Stroke Mother        in 70s   Breast cancer Maternal Aunt    Diabetes Neg Hx    Social History   Socioeconomic History   Marital status: Widowed    Spouse name: Not on file   Number of children: Not on file  Years of education: Not on file   Highest education level: Master's degree (e.g., MA, MS, MEng, MEd, MSW, MBA)  Occupational History   Occupation: Retired Landscape architect, trained in Facilities manager therapy  Tobacco Use   Smoking status: Never   Smokeless tobacco: Never  Substance and Sexual Activity   Alcohol use: Yes    Alcohol/week: 7.0 standard drinks of alcohol    Types: 7 Glasses of wine per week   Drug use: No   Sexual activity: Not on file  Other Topics Concern   Not on file  Social History Narrative   Lives w/ Husband in [place]   Social Determinants of Health   Financial Resource Strain: Low Risk  (06/09/2022)   Overall Financial Resource Strain (CARDIA)    Difficulty of Paying Living Expenses: Not hard at all  Food Insecurity: No Food Insecurity (06/09/2022)   Hunger Vital Sign    Worried About  Running Out of Food in the Last Year: Never true    Ran Out of Food in the Last Year: Never true  Transportation Needs: No Transportation Needs (06/09/2022)   PRAPARE - Administrator, Civil Service (Medical): No    Lack of Transportation (Non-Medical): No  Physical Activity: Unknown (01/07/2022)   Exercise Vital Sign    Days of Exercise per Week: Patient refused    Minutes of Exercise per Session: Not on file  Stress: No Stress Concern Present (06/09/2022)   [employer] of Occupational Health - Occupational Stress Questionnaire    Feeling of Stress : Not at all  Social Connections: Moderately Integrated (06/09/2022)   Social Connection and Isolation Panel [NHANES]    Frequency of Communication with Friends and Family: More than three times a week    Frequency of Social Gatherings with Friends and Family: More than three times a week    Attends Religious Services: More than 4 times per year    Active Member of [employer] or Organizations: Yes    Attends Banker Meetings: More than 4 times per year    Marital Status: Widowed    Tobacco Counseling Counseling given: Not Answered   Clinical Intake:  Pre-visit preparation completed: Yes  Pain : No/denies pain     BMI - recorded: 26.39 Nutritional Status: BMI 25 -29 Overweight Nutritional Risks: None Diabetes: No  How often do you need to have someone help you when you read instructions, pamphlets, or other written materials from your doctor or pharmacy?: 1 - Never  Diabetic?  No  Interpreter Needed?: No  Information entered by :: Theresa Mulligan LPN   Activities of Daily Living    06/09/2022   10:13 AM 06/08/2022   11:30 AM  In your present state of health, do you have any difficulty performing the following activities:  Hearing? 0 0  Vision? 0 0  Difficulty concentrating or making decisions? 0 0  Walking or climbing stairs? 0 0  Dressing or bathing? 0 0  Doing errands, shopping? 0 0   Preparing Food and eating ? N N  Using the Toilet? N N  In the past six months, have you accidently leaked urine? N N  Do you have problems with loss of bowel control? N N  Managing your Medications? N N  Managing your Finances? N N  Housekeeping or managing your Housekeeping? N N    Patient Care Team: [place], Betty G, MD as PCP - General (Family Medicine)  Indicate any recent Medical Services you may have received  from other than [facility] providers in the past year (date may be approximate).     Assessment:   This is a routine wellness examination for Andrea Kelley.  Hearing/Vision screen Hearing Screening - Comments:: Denies hearing difficulties   Vision Screening - Comments:: Wears rx glasses - up to date with routine eye exams with  Dr Algernon Huxley  Dietary issues and exercise activities discussed: Current Exercise Habits: The patient does not participate in regular exercise at present, Exercise limited by: None identified   Goals Addressed               This Visit's Progress     Stay Healthy (pt-stated)         Depression Screen    06/09/2022   10:12 AM 03/12/2022    7:56 AM 01/08/2022   11:49 AM 06/06/2021    9:06 AM 06/06/2021    9:04 AM 06/05/2020    8:54 AM 05/16/2019   10:06 AM  PHQ 2/9 Scores  PHQ - 2 Score 0 0 0 0 0 0 0  PHQ- 9 Score  2         Fall Risk    06/09/2022   10:13 AM 06/08/2022   11:30 AM 03/12/2022    7:56 AM 01/08/2022   11:49 AM 01/07/2022   11:29 AM  Fall Risk   Falls in the past year? 1 1 1 1 1   Number falls in past yr: 0 0 0 0 0  Injury with Fall? 1 1 1 1 1   Comment Fx left ankle followed Orthopedic Dr      Risk for fall due to :   History of fall(s) History of fall(s)   Follow up Falls prevention discussed  Falls evaluation completed Falls evaluation completed     FALL RISK PREVENTION PERTAINING TO THE HOME:  Any stairs in or around the home? Yes  If so, are there any without handrails? No  Home free of loose throw rugs in  walkways, pet beds, electrical cords, etc? Yes  Adequate lighting in your home to reduce risk of falls? Yes   ASSISTIVE DEVICES UTILIZED TO PREVENT FALLS:  Life alert? No  Use of a cane, walker or w/c? No  Grab bars in the bathroom? Yes  Shower chair or bench in shower? No  Elevated toilet seat or a handicapped toilet? No   TIMED UP AND GO:  Was the test performed? Yes .  Length of time to ambulate 10 feet: 10 sec.   Gait steady and fast without use of assistive device  Cognitive Function:        06/09/2022   10:16 AM  6CIT Screen  What Year? 0 points  What month? 0 points  What time? 0 points  Count back from 20 0 points  Months in reverse 0 points  Repeat phrase 0 points  Total Score 0 points    Immunizations Immunization History  Administered Date(s) Administered   Fluad Quad(high Dose 65+) 05/16/2019, 05/10/2020, 05/03/2022   Influenza Split 06/16/2012   Influenza Whole 07/28/2008, 06/07/2009   Influenza, High Dose Seasonal PF 06/28/2014, 09/10/2017, 05/27/2018   Influenza,inj,Quad PF,6+ Mos 05/12/2015, 04/30/2016   Influenza-Unspecified 06/11/2013, 06/11/2014   PFIZER(Purple Top)SARS-COV-2 Vaccination 08/24/2019, 09/13/2019, 05/10/2020   Pneumococcal Conjugate-13 09/10/2017   Pneumococcal Polysaccharide-23 06/28/2014   Pneumococcal-Unspecified 10/25/2011   Tdap 01/02/2015   Zoster, Live 10/25/2010    TDAP status: Up to date  Flu Vaccine status: Up to date  Pneumococcal vaccine status: Up to date  Covid-19 vaccine status: Completed vaccines  Qualifies for Shingles Vaccine? Yes   Zostavax completed No   Shingrix Completed?: No.    Education has been provided regarding the importance of this vaccine. Patient has been advised to call insurance company to determine out of pocket expense if they have not yet received this vaccine. Advised may also receive vaccine at local pharmacy or Health Dept. Verbalized acceptance and understanding.  Screening  Tests Health Maintenance  Topic Date Due   COVID-19 Vaccine (4 - Pfizer series) 06/25/2022 (Originally 07/05/2020)   Zoster Vaccines- Shingrix (1 of 2) 08/15/2022 (Originally [birth year]/1990)   Medicare Annual Wellness (AWV)  06/10/2023   TETANUS/TDAP  01/01/2025   Pneumonia Vaccine 22+ Years old  Completed   INFLUENZA VACCINE  Completed   DEXA SCAN  Completed   HPV VACCINES  Aged Out    Health Maintenance  There are no preventive care reminders to display for this patient.   Colorectal cancer screening: No longer required.   Mammogram status: No longer required due to Age.  Bone Density status: Completed 05/27/18. Results reflect: Bone density results: OSTEOPOROSIS. Repeat every   years.  Lung Cancer Screening: (Low Dose CT Chest recommended if Age 35-80 years, 30 pack-year currently smoking OR have quit w/in 15years.) does not qualify.     Additional Screening:  Hepatitis C Screening: does not qualify; Completed   Vision Screening: Recommended annual ophthalmology exams for early detection of glaucoma and other disorders of the eye. Is the patient up to date with their annual eye exam?  Yes  Who is the provider or what is the name of the office in which the patient attends annual eye exams? Dr Monica Martinez If pt is not established with a provider, would they like to be referred to a provider to establish care? No .   Dental Screening: Recommended annual dental exams for proper oral hygiene  Community Resource Referral / Chronic Care Management:  CRR required this visit?  No   CCM required this visit?  No      Plan:     I have personally reviewed and noted the following in the patient's chart:   Medical and social history Use of alcohol, tobacco or illicit drugs  Current medications and supplements including opioid prescriptions. Patient is not currently taking opioid prescriptions. Functional ability and status Nutritional status Physical activity Advanced  directives List of other physicians Hospitalizations, surgeries, and ER visits in previous 12 months Vitals Screenings to include cognitive, depression, and falls Referrals and appointments  In addition, I have reviewed and discussed with patient certain preventive protocols, quality metrics, and best practice recommendations. A written personalized care plan for preventive services as well as general preventive health recommendations were provided to patient.     Criselda Peaches, LPN   06/04/8526   Nurse Notes: None

## 2022-06-09 NOTE — Patient Instructions (Addendum)
[name] , Thank you for taking time to come for your Medicare Wellness Visit. I appreciate your ongoing commitment to your health goals. Please review the following plan we discussed and let me know if I can assist you in the future.   These are the goals we discussed:  Goals       LIFESTYLE - DECREASE FALLS RISK      Stay Healthy (pt-stated)        This is a list of the screening recommended for you and due dates:  Health Maintenance  Topic Date Due   COVID-19 Vaccine (4 - Pfizer series) 06/25/2022*   Zoster (Shingles) Vaccine (1 of 2) 08/15/2022*   Medicare Annual Wellness Visit  06/10/2023   Tetanus Vaccine  01/01/2025   Pneumonia Vaccine  Completed   Flu Shot  Completed   DEXA scan (bone density measurement)  Completed   HPV Vaccine  Aged Out  *Topic was postponed. The date shown is not the original due date.    Advanced directives: Please bring a copy of your health care power of attorney and living will to the office to be added to your chart at your convenience.   Conditions/risks identified: None  Next appointment: Follow up in one year for your annual wellness visit    Preventive Care 65 Years and Older, Female Preventive care refers to lifestyle choices and visits with your health care provider that can promote health and wellness. What does preventive care include? A yearly physical exam. This is also called an annual well check. Dental exams once or twice a year. Routine eye exams. Ask your health care provider how often you should have your eyes checked. Personal lifestyle choices, including: Daily care of your teeth and gums. Regular physical activity. Eating a healthy diet. Avoiding tobacco and drug use. Limiting alcohol use. Practicing safe sex. Taking low-dose aspirin every day. Taking vitamin and mineral supplements as recommended by your health care provider. What happens during an annual well check? The services and screenings done by your  health care provider during your annual well check will depend on your age, overall health, lifestyle risk factors, and family history of disease. Counseling  Your health care provider may ask you questions about your: Alcohol use. Tobacco use. Drug use. Emotional well-being. Home and relationship well-being. Sexual activity. Eating habits. History of falls. Memory and ability to understand (cognition). Work and work Statistician. Reproductive health. Screening  You may have the following tests or measurements: Height, weight, and BMI. Blood pressure. Lipid and cholesterol levels. These may be checked every 5 years, or more frequently if you are over 76 years old. Skin check. Lung cancer screening. You may have this screening every year starting at age 89 if you have a 30-pack-year history of smoking and currently smoke or have quit within the past 15 years. Fecal occult blood test (FOBT) of the stool. You may have this test every year starting at age 8. Flexible sigmoidoscopy or colonoscopy. You may have a sigmoidoscopy every 5 years or a colonoscopy every 10 years starting at age 27. Hepatitis C blood test. Hepatitis B blood test. Sexually transmitted disease (STD) testing. Diabetes screening. This is done by checking your blood sugar (glucose) after you have not eaten for a while (fasting). You may have this done every 1-3 years. Bone density scan. This is done to screen for osteoporosis. You may have this done starting at age 49. Mammogram. This may be done every 1-2 years. Talk to  your health care provider about how often you should have regular mammograms. Talk with your health care provider about your test results, treatment options, and if necessary, the need for more tests. Vaccines  Your health care provider may recommend certain vaccines, such as: Influenza vaccine. This is recommended every year. Tetanus, diphtheria, and acellular pertussis (Tdap, Td) vaccine. You may  need a Td booster every 10 years. Zoster vaccine. You may need this after age 76. Pneumococcal 13-valent conjugate (PCV13) vaccine. One dose is recommended after age 37. Pneumococcal polysaccharide (PPSV23) vaccine. One dose is recommended after age 66. Talk to your health care provider about which screenings and vaccines you need and how often you need them. This information is not intended to replace advice given to you by your health care provider. Make sure you discuss any questions you have with your health care provider. Document Released: 08/24/2015 Document Revised: 04/16/2016 Document Reviewed: 05/29/2015 Elsevier Interactive Patient Education  2017 [place] Prevention in the Home Falls can cause injuries. They can happen to people of all ages. There are many things you can do to make your home safe and to help prevent falls. What can I do on the outside of my home? Regularly fix the edges of walkways and driveways and fix any cracks. Remove anything that might make you trip as you walk through a door, such as a raised step or threshold. Trim any bushes or trees on the path to your home. Use bright outdoor lighting. Clear any walking paths of anything that might make someone trip, such as rocks or tools. Regularly check to see if handrails are loose or broken. Make sure that both sides of any steps have handrails. Any raised decks and porches should have guardrails on the edges. Have any leaves, snow, or ice cleared regularly. Use sand or salt on walking paths during winter. Clean up any spills in your garage right away. This includes oil or grease spills. What can I do in the bathroom? Use night lights. Install grab bars by the toilet and in the tub and shower. Do not use towel bars as grab bars. Use non-skid mats or decals in the tub or shower. If you need to sit down in the shower, use a plastic, non-slip stool. Keep the floor dry. Clean up any water that spills on  the floor as soon as it happens. Remove soap buildup in the tub or shower regularly. Attach bath mats securely with double-sided non-slip rug tape. Do not have throw rugs and other things on the floor that can make you trip. What can I do in the bedroom? Use night lights. Make sure that you have a light by your bed that is easy to reach. Do not use any sheets or blankets that are too big for your bed. They should not hang down onto the floor. Have a firm chair that has side arms. You can use this for support while you get dressed. Do not have throw rugs and other things on the floor that can make you trip. What can I do in the kitchen? Clean up any spills right away. Avoid walking on wet floors. Keep items that you use a lot in easy-to-reach places. If you need to reach something above you, use a strong step stool that has a grab bar. Keep electrical cords out of the way. Do not use floor polish or wax that makes floors slippery. If you must use wax, use non-skid floor wax. Do not  have throw rugs and other things on the floor that can make you trip. What can I do with my stairs? Do not leave any items on the stairs. Make sure that there are handrails on both sides of the stairs and use them. Fix handrails that are broken or loose. Make sure that handrails are as long as the stairways. Check any carpeting to make sure that it is firmly attached to the stairs. Fix any carpet that is loose or worn. Avoid having throw rugs at the top or bottom of the stairs. If you do have throw rugs, attach them to the floor with carpet tape. Make sure that you have a light switch at the top of the stairs and the bottom of the stairs. If you do not have them, ask someone to add them for you. What else can I do to help prevent falls? Wear shoes that: Do not have high heels. Have rubber bottoms. Are comfortable and fit you well. Are closed at the toe. Do not wear sandals. If you use a stepladder: Make sure  that it is fully opened. Do not climb a closed stepladder. Make sure that both sides of the stepladder are locked into place. Ask someone to hold it for you, if possible. Clearly mark and make sure that you can see: Any grab bars or handrails. First and last steps. Where the edge of each step is. Use tools that help you move around (mobility aids) if they are needed. These include: Canes. Walkers. Scooters. Crutches. Turn on the lights when you go into a dark area. Replace any light bulbs as soon as they burn out. Set up your furniture so you have a clear path. Avoid moving your furniture around. If any of your floors are uneven, fix them. If there are any pets around you, be aware of where they are. Review your medicines with your doctor. Some medicines can make you feel dizzy. This can increase your chance of falling. Ask your doctor what other things that you can do to help prevent falls. This information is not intended to replace advice given to you by your health care provider. Make sure you discuss any questions you have with your health care provider. Document Released: 05/24/2009 Document Revised: 01/03/2016 Document Reviewed: 09/01/2014 Elsevier Interactive Patient Education  2017 [employer].

## 2022-07-04 ENCOUNTER — Other Ambulatory Visit: Payer: Self-pay | Admitting: Family Medicine

## 2022-07-04 DIAGNOSIS — G2581 Restless legs syndrome: Secondary | ICD-10-CM

## 2022-08-08 NOTE — Progress Notes (Unsigned)
HPI:  Andrea Kelley is a 81 y.o. female, who is here today to follow on recent visit.  Review of Systems See other pertinent positives and negatives in HPI.  Current Outpatient Medications on File Prior to Visit  Medication Sig Dispense Refill   amLODipine (NORVASC) 5 MG tablet TAKE 1 TABLET(5 MG) BY MOUTH DAILY 90 tablet 2   losartan (COZAAR) 50 MG tablet TAKE 1 TABLET BY MOUTH DAILY 90 tablet 2   pravastatin (PRAVACHOL) 20 MG tablet TAKE 1 TABLET BY MOUTH EVERY DAY 90 tablet 2   rOPINIRole (REQUIP) 1 MG tablet TAKE 1/2 TO 1 TABLET BY MOUTH DAILY AS NEEDED 90 tablet 2   ropinirole (REQUIP) 5 MG tablet TAKE 1 TABLET(5 MG) BY MOUTH AT BEDTIME 90 tablet 2   No current facility-administered medications on file prior to visit.    Past Medical History:  Diagnosis Date   DDD (degenerative disc disease), lumbar    Hyperlipidemia    Hypertension    Osteopenia    Andrea Kelley   Syncope    Allergies  Allergen Reactions   Sulfonamide Derivatives Rash    REACTION: rash as child    Social History   Socioeconomic History   Marital status: Widowed    Spouse name: Not on file   Number of children: Not on file   Years of education: Not on file   Highest education level: Master's degree (e.g., MA, MS, MEng, MEd, MSW, MBA)  Occupational History   Occupation: Retired Landscape architect, trained in Facilities manager therapy  Tobacco Use   Smoking status: Never   Smokeless tobacco: Never  Substance and Sexual Activity   Alcohol use: Yes    Alcohol/week: 7.0 standard drinks of alcohol    Types: 7 Glasses of wine per week   Drug use: No   Sexual activity: Not on file  Other Topics Concern   Not on file  Social History Narrative   Lives w/ Husband in [place]   Social Determinants of Health   Financial Resource Strain: Low Risk  (06/09/2022)   Overall Financial Resource Strain (CARDIA)    Difficulty of Paying Living Expenses: Not hard at all  Food Insecurity: No Food Insecurity  (06/09/2022)   Hunger Vital Sign    Worried About Running Out of Food in the Last Year: Never true    Ran Out of Food in the Last Year: Never true  Transportation Needs: No Transportation Needs (06/09/2022)   PRAPARE - Administrator, Civil Service (Medical): No    Lack of Transportation (Non-Medical): No  Physical Activity: Unknown (01/07/2022)   Exercise Vital Sign    Days of Exercise per Week: Patient refused    Minutes of Exercise per Session: Not on file  Stress: No Stress Concern Present (06/09/2022)   [employer] of Occupational Health - Occupational Stress Questionnaire    Feeling of Stress : Not at all  Social Connections: Moderately Integrated (06/09/2022)   Social Connection and Isolation Panel [NHANES]    Frequency of Communication with Friends and Family: More than three times a week    Frequency of Social Gatherings with Friends and Family: More than three times a week    Attends Religious Services: More than 4 times per year    Active Member of [employer] or Organizations: Yes    Attends Banker Meetings: More than 4 times per year    Marital Status: Widowed    There were no vitals filed for this  visit. There is no height or weight on file to calculate BMI.  Physical Exam  ASSESSMENT AND PLAN:  There are no diagnoses linked to this encounter.  No orders of the defined types were placed in this encounter.   No problem-specific Assessment & Plan notes found for this encounter.   No follow-ups on file.  Andrea G. [place], MD  [facility]. [place] office.

## 2022-08-12 ENCOUNTER — Ambulatory Visit (INDEPENDENT_AMBULATORY_CARE_PROVIDER_SITE_OTHER): Payer: BLUE CROSS/BLUE SHIELD | Admitting: Family Medicine

## 2022-08-12 ENCOUNTER — Encounter: Payer: Self-pay | Admitting: Family Medicine

## 2022-08-12 VITALS — BP 118/64 | HR 94 | Temp 98.1°F | Resp 16 | Ht 65.0 in | Wt 156.1 lb

## 2022-08-12 DIAGNOSIS — R7303 Prediabetes: Secondary | ICD-10-CM | POA: Diagnosis not present

## 2022-08-12 DIAGNOSIS — E782 Mixed hyperlipidemia: Secondary | ICD-10-CM

## 2022-08-12 DIAGNOSIS — I10 Essential (primary) hypertension: Secondary | ICD-10-CM | POA: Diagnosis not present

## 2022-08-12 LAB — COMPREHENSIVE METABOLIC PANEL
ALT: 26 U/L (ref 0–35)
AST: 19 U/L (ref 0–37)
Albumin: 4.2 g/dL (ref 3.5–5.2)
Alkaline Phosphatase: 91 U/L (ref 39–117)
BUN: 19 mg/dL (ref 6–23)
CO2: 22 mEq/L (ref 19–32)
Calcium: 9.3 mg/dL (ref 8.4–10.5)
Chloride: 107 mEq/L (ref 96–112)
Creatinine, Ser: 0.85 mg/dL (ref 0.40–1.20)
GFR: 63.78 mL/min (ref >60.00)
Glucose, Bld: 103 mg/dL — ABNORMAL HIGH (ref 70–99)
Potassium: 3.9 mEq/L (ref 3.5–5.1)
Sodium: 140 mEq/L (ref 135–145)
Total Bilirubin: 0.4 mg/dL (ref 0.2–1.2)
Total Protein: 7 g/dL (ref 6.0–8.3)

## 2022-08-12 LAB — LIPID PANEL
Cholesterol: 159 mg/dL (ref 0–200)
HDL: 46.7 mg/dL (ref >39.00)
LDL Cholesterol: 79 mg/dL (ref 0–99)
NonHDL: 112.22
Total CHOL/HDL Ratio: 3
Triglycerides: 164 mg/dL — ABNORMAL HIGH (ref 0.0–149.0)
VLDL: 32.8 mg/dL (ref 0.0–40.0)

## 2022-08-12 LAB — HEMOGLOBIN A1C: Hgb A1c MFr Bld: 6.1 % (ref 4.6–6.5)

## 2022-08-12 NOTE — Assessment & Plan Note (Addendum)
BP adequately controlled. Continue Losartan 50 mg daily and Amlodipine 5 mg daily as well as low salt diet. Continue monitoring BP at home. Eye exam is current.  Today noted a pause q 3 beats, asymptomatic, hx of irregular HR (PVC's). EKG done today showed NSR, LAD, and normal intervals. PVCs seen in 02/2017 no longer present, rest with no significant changes. Instructed about warning signs.

## 2022-08-12 NOTE — Assessment & Plan Note (Signed)
LDL 59 In 11/2021. Continue Pravastatin 20 mg daily and low fat diet. Further recommendations according to FLP result.

## 2022-08-12 NOTE — Patient Instructions (Addendum)
A few things to remember from today's visit:  Essential (primary) hypertension - Plan: EKG 12-Lead, Comprehensive metabolic panel  Mixed hyperlipidemia - Plan: Comprehensive metabolic panel, Lipid panel  Prediabetes - Plan: Hemoglobin A1c  EKG otherwise normal today. No changes in current medications. I will see you back in 6 months, before if a new problem present.  If you need refills for medications you take chronically, please call your pharmacy. Do not use My Chart to request refills or for acute issues that need immediate attention. If you send a my chart message, it may take a few days to be addressed, specially if I am not in the office.  Please be sure medication list is accurate.

## 2022-08-12 NOTE — Assessment & Plan Note (Signed)
HgA1C 6.0 in 05/2020. Continue a healthy life style for diabetes prevention. Further recommendations according to HgA1C result.

## 2022-10-03 ENCOUNTER — Other Ambulatory Visit: Payer: Self-pay | Admitting: Family Medicine

## 2022-10-03 DIAGNOSIS — I10 Essential (primary) hypertension: Secondary | ICD-10-CM

## 2023-01-01 ENCOUNTER — Other Ambulatory Visit: Payer: Self-pay | Admitting: Family Medicine

## 2023-01-01 DIAGNOSIS — E782 Mixed hyperlipidemia: Secondary | ICD-10-CM

## 2023-01-01 DIAGNOSIS — I10 Essential (primary) hypertension: Secondary | ICD-10-CM

## 2023-02-02 ENCOUNTER — Encounter (INDEPENDENT_AMBULATORY_CARE_PROVIDER_SITE_OTHER): Payer: Medicare Other | Admitting: Ophthalmology

## 2023-02-02 DIAGNOSIS — I10 Essential (primary) hypertension: Secondary | ICD-10-CM

## 2023-02-02 DIAGNOSIS — H353112 Nonexudative age-related macular degeneration, right eye, intermediate dry stage: Secondary | ICD-10-CM

## 2023-02-02 DIAGNOSIS — H43813 Vitreous degeneration, bilateral: Secondary | ICD-10-CM

## 2023-02-02 DIAGNOSIS — H35033 Hypertensive retinopathy, bilateral: Secondary | ICD-10-CM

## 2023-02-02 DIAGNOSIS — H353221 Exudative age-related macular degeneration, left eye, with active choroidal neovascularization: Secondary | ICD-10-CM | POA: Diagnosis not present

## 2023-03-30 ENCOUNTER — Other Ambulatory Visit: Payer: Self-pay | Admitting: Family Medicine

## 2023-03-30 DIAGNOSIS — G2581 Restless legs syndrome: Secondary | ICD-10-CM

## 2023-05-27 DIAGNOSIS — H524 Presbyopia: Secondary | ICD-10-CM | POA: Diagnosis not present

## 2023-05-27 DIAGNOSIS — H43813 Vitreous degeneration, bilateral: Secondary | ICD-10-CM | POA: Diagnosis not present

## 2023-05-27 DIAGNOSIS — Z961 Presence of intraocular lens: Secondary | ICD-10-CM | POA: Diagnosis not present

## 2023-05-27 DIAGNOSIS — H353132 Nonexudative age-related macular degeneration, bilateral, intermediate dry stage: Secondary | ICD-10-CM | POA: Diagnosis not present

## 2023-05-27 DIAGNOSIS — H11153 Pinguecula, bilateral: Secondary | ICD-10-CM | POA: Diagnosis not present

## 2023-06-24 ENCOUNTER — Ambulatory Visit: Payer: Medicare Other

## 2023-06-24 VITALS — BP 120/60 | HR 64 | Temp 97.6°F | Ht 65.0 in | Wt 145.9 lb

## 2023-06-24 DIAGNOSIS — Z0000 Encounter for general adult medical examination without abnormal findings: Secondary | ICD-10-CM | POA: Diagnosis not present

## 2023-06-24 NOTE — Patient Instructions (Addendum)
Andrea Kelley , Thank you for taking time to come for your Medicare Wellness Visit. I appreciate your ongoing commitment to your health goals. Please review the following plan we discussed and let me know if I can assist you in the future.   Referrals/Orders/Follow-Ups/Clinician Recommendations:   This is a list of the screening recommended for you and due dates:  Health Maintenance  Topic Date Due   Zoster (Shingles) Vaccine (1 of 2) 02/21/1990   COVID-19 Vaccine (5 - 2023-24 season) 04/12/2023   Medicare Annual Wellness Visit  06/23/2024   DTaP/Tdap/Td vaccine (2 - Td or Tdap) 01/01/2025   Pneumonia Vaccine  Completed   Flu Shot  Completed   DEXA scan (bone density measurement)  Completed   HPV Vaccine  Aged Out    Advanced directives: (Copy Requested) Please bring a copy of your health care power of attorney and living will to the office to be added to your chart at your convenience.  Next Medicare Annual Wellness Visit scheduled for next year: Yes

## 2023-06-24 NOTE — Progress Notes (Signed)
Subjective:   Andrea Kelley is a 82 y.o. female who presents for Medicare Annual (Subsequent) preventive examination.  Visit Complete: In person  Patient Medicare AWV questionnaire was completed by the patient on 06/20/23; I have confirmed that all information answered by patient is correct and no changes since this date.  Cardiac Risk Factors include: advanced age (>66men, >103 women);hypertension     Objective:    Today's Vitals   06/24/23 0930  BP: 120/60  Pulse: 64  Temp: 97.6 F (36.4 C)  TempSrc: Oral  SpO2: 97%  Weight: 145 lb 14.4 oz (66.2 kg)  Height: 5\' 5"  (1.651 m)   Body mass index is 24.28 kg/m.     06/24/2023    9:48 AM 06/09/2022   10:15 AM 10/23/2021    8:02 AM 06/06/2021    9:06 AM 09/10/2020    8:59 AM 03/27/2020    8:37 AM 11/17/2019    4:04 PM  Advanced Directives  Does Patient Have a Medical Advance Directive? Yes Yes Yes Yes Yes Yes Yes  Type of Estate agent of [place];Living will Healthcare Power of [place];Living will Healthcare Power of [place];Living will Healthcare Power of [place];Living will Healthcare Power of [place];Living will Healthcare Power of [place];Living will Healthcare Power of [place];Living will  Does patient want to make changes to medical advance directive?   No - Patient declined  No - Patient declined No - Patient declined No - Patient declined  Copy of Healthcare Power of Attorney in Chart? No - copy requested No - copy requested No - copy requested No - copy requested No - copy requested No - copy requested No - copy requested  Would patient like information on creating a medical advance directive?   No - Guardian declined        Current Medications (verified) Outpatient Encounter Medications as of 06/24/2023  Medication Sig   amLODipine (NORVASC) 5 MG tablet TAKE 1 TABLET(5 MG) BY MOUTH DAILY   losartan (COZAAR) 50 MG tablet TAKE 1 TABLET BY MOUTH DAILY   pravastatin (PRAVACHOL) 20 MG tablet  TAKE 1 TABLET BY MOUTH EVERY DAY   rOPINIRole (REQUIP) 1 MG tablet TAKE 1/2 TO 1 TABLET BY MOUTH DAILY AS NEEDED   ropinirole (REQUIP) 5 MG tablet TAKE 1 TABLET(5 MG) BY MOUTH AT BEDTIME   No facility-administered encounter medications on file as of 06/24/2023.    Allergies (verified) Sulfonamide derivatives   History: Past Medical History:  Diagnosis Date   DDD (degenerative disc disease), lumbar    Hyperlipidemia    Hypertension    Osteopenia    [place] [facility]   Syncope    Past Surgical History:  Procedure Laterality Date   ABDOMINAL HYSTERECTOMY     For dysfunctional bleeding; no BSO   BACK SURGERY  1970   ruptured disc ; Dr Roxan Hockey   COLONOSCOPY  2001   Negative; done for rectal bleeding   LOOP RECORDER INSERTION N/A 02/16/2017   Procedure: Loop Recorder Insertion;  Surgeon: Thurmon Fair, MD;  Location: [facility];  Service: Cardiovascular;  Laterality: N/A;   Family History  Problem Relation Age of Onset   Lung cancer Father        smoker   Rheum arthritis Father    Heart attack Mother        12s   Stroke Mother        in 54s   Breast cancer Maternal Aunt    Diabetes Neg Hx  Social History   Socioeconomic History   Marital status: Widowed    Spouse name: Not on file   Number of children: Not on file   Years of education: Not on file   Highest education level: Master's degree (e.g., MA, MS, MEng, MEd, MSW, MBA)  Occupational History   Occupation: Retired Landscape architect, trained in Facilities manager therapy  Tobacco Use   Smoking status: Never   Smokeless tobacco: Never  Substance and Sexual Activity   Alcohol use: Yes    Alcohol/week: 7.0 standard drinks of alcohol    Types: 7 Glasses of wine per week   Drug use: No   Sexual activity: Not on file  Other Topics Concern   Not on file  Social History Narrative   Lives w/ Husband in [place]   Social Determinants of Health   Financial Resource Strain: Low Risk  (06/20/2023)   Overall Financial  Resource Strain (CARDIA)    Difficulty of Paying Living Expenses: Not hard at all  Food Insecurity: No Food Insecurity (06/20/2023)   Hunger Vital Sign    Worried About Running Out of Food in the Last Year: Never true    Ran Out of Food in the Last Year: Never true  Transportation Needs: No Transportation Needs (06/20/2023)   PRAPARE - Administrator, Civil Service (Medical): No    Lack of Transportation (Non-Medical): No  Physical Activity: Unknown (06/20/2023)   Exercise Vital Sign    Days of Exercise per Week: 2 days    Minutes of Exercise per Session: Patient declined  Stress: No Stress Concern Present (06/20/2023)   [employer] of Occupational Health - Occupational Stress Questionnaire    Feeling of Stress : Not at all  Social Connections: Unknown (06/20/2023)   Social Connection and Isolation Panel [NHANES]    Frequency of Communication with Friends and Family: Once a week    Frequency of Social Gatherings with Friends and Family: Patient declined    Attends Religious Services: Not on Insurance claims handler of Clubs or Organizations: Patient declined    Attends Banker Meetings: Patient declined    Marital Status: Widowed    Tobacco Counseling Counseling given: Not Answered   Clinical Intake:  Pre-visit preparation completed: Yes  Pain : No/denies pain     BMI - recorded: 24.28 Nutritional Status: BMI of 19-24  Normal Nutritional Risks: None Diabetes: No  How often do you need to have someone help you when you read instructions, pamphlets, or other written materials from your doctor or pharmacy?: 1 - Never  Interpreter Needed?: No  Information entered by :: Theresa Mulligan LPN   Activities of Daily Living    06/20/2023   10:14 AM  In your present state of health, do you have any difficulty performing the following activities:  Hearing? 0  Vision? 0  Difficulty concentrating or making decisions? 0  Walking or climbing stairs? 0   Dressing or bathing? 0  Doing errands, shopping? 0  Preparing Food and eating ? N  Using the Toilet? N  In the past six months, have you accidently leaked urine? N  Do you have problems with loss of bowel control? N  Managing your Medications? N  Managing your Finances? N  Housekeeping or managing your Housekeeping? N    Patient Care Team: [place], Betty G, MD as PCP - General (Family Medicine)  Indicate any recent Medical Services you may have received from other than [facility] providers in the  past year (date may be approximate).     Assessment:   This is a routine wellness examination for Andrea Kelley.  Hearing/Vision screen Hearing Screening - Comments:: Denies hearing difficulties   Vision Screening - Comments:: Wears rx glasses - up to date with routine eye exams with  [facility]   Goals Addressed               This Visit's Progress     Stay active! (pt-stated)         Depression Screen    06/24/2023    9:55 AM 08/12/2022    8:01 AM 06/09/2022   10:12 AM 03/12/2022    7:56 AM 01/08/2022   11:49 AM 06/06/2021    9:06 AM 06/06/2021    9:04 AM  PHQ 2/9 Scores  PHQ - 2 Score 0 0 0 0 0 0 0  PHQ- 9 Score    2       Fall Risk    06/24/2023    9:47 AM 06/20/2023   10:14 AM 08/12/2022    8:01 AM 06/09/2022   10:13 AM 06/08/2022   11:30 AM  Fall Risk   Falls in the past year? 0 0 0 1 1  Number falls in past yr: 0 0 0 0 0  Injury with Fall? 0 0 0 1 1  Comment    Fx left ankle followed Orthopedic Dr Cleophas Dunker   Risk for fall due to : No Fall Risks  History of fall(s)    Follow up Falls prevention discussed  Falls evaluation completed Falls prevention discussed     MEDICARE RISK AT HOME: Medicare Risk at Home Any stairs in or around the home?: Yes If so, are there any without handrails?: Yes Home free of loose throw rugs in walkways, pet beds, electrical cords, etc?: No Adequate lighting in your home to reduce risk of falls?: Yes Life alert?: No Use of a cane,  walker or w/c?: No Grab bars in the bathroom?: Yes Shower chair or bench in shower?: No Elevated toilet seat or a handicapped toilet?: No  TIMED UP AND GO:  Was the test performed?  Yes  Length of time to ambulate 10 feet: 10 sec Gait steady and fast without use of assistive device    Cognitive Function:        06/24/2023    9:48 AM 06/09/2022   10:16 AM  6CIT Screen  What Year? 0 points 0 points  What month? 0 points 0 points  What time? 0 points 0 points  Count back from 20 0 points 0 points  Months in reverse 0 points 0 points  Repeat phrase 0 points 0 points  Total Score 0 points 0 points    Immunizations Immunization History  Administered Date(s) Administered   Fluad Quad(high Dose 65+) 05/16/2019, 05/10/2020, 05/03/2022   Influenza Split 06/16/2012   Influenza Whole 07/28/2008, 06/07/2009   Influenza, High Dose Seasonal PF 06/28/2014, 09/10/2017, 05/27/2018   Influenza,inj,Quad PF,6+ Mos 05/12/2015, 04/30/2016   Influenza-Unspecified 06/11/2013, 06/11/2014, 05/28/2023   PFIZER(Purple Top)SARS-COV-2 Vaccination 08/24/2019, 09/13/2019, 05/10/2020   Pneumococcal Conjugate-13 09/10/2017   Pneumococcal Polysaccharide-23 06/28/2014   Pneumococcal-Unspecified 10/25/2011   Tdap 01/02/2015   Unspecified SARS-COV-2 Vaccination 06/13/2022   Zoster, Live 10/25/2010    TDAP status: Up to date  Flu Vaccine status: Up to date  Pneumococcal vaccine status: Up to date  Covid-19 vaccine status: Declined, Education has been provided regarding the importance of this vaccine but patient still declined. Advised  may receive this vaccine at local pharmacy or Health Dept.or vaccine clinic. Aware to provide a copy of the vaccination record if obtained from local pharmacy or Health Dept. Verbalized acceptance and understanding.  Qualifies for Shingles Vaccine? Yes   Zostavax completed No   Shingrix Completed?: No.    Education has been provided regarding the importance of this  vaccine. Patient has been advised to call insurance company to determine out of pocket expense if they have not yet received this vaccine. Advised may also receive vaccine at local pharmacy or Health Dept. Verbalized acceptance and understanding.  Screening Tests Health Maintenance  Topic Date Due   Zoster Vaccines- Shingrix (1 of 2) 02/21/1990   COVID-19 Vaccine (5 - 2023-24 season) 04/12/2023   Medicare Annual Wellness (AWV)  06/23/2024   DTaP/Tdap/Td (2 - Td or Tdap) 01/01/2025   Pneumonia Vaccine 26+ Years old  Completed   INFLUENZA VACCINE  Completed   DEXA SCAN  Completed   HPV VACCINES  Aged Out    Health Maintenance  Health Maintenance Due  Topic Date Due   Zoster Vaccines- Shingrix (1 of 2) 02/21/1990   COVID-19 Vaccine (5 - 2023-24 season) 04/12/2023        Bone Density status: Completed 05/27/18. Results reflect: Bone density results: OSTEOPOROSIS. Repeat every   years.    Vision Screening: Recommended annual ophthalmology exams for early detection of glaucoma and other disorders of the eye. Is the patient up to date with their annual eye exam?  Yes  Who is the provider or what is the name of the office in which the patient attends annual eye exams? Dr Hazle Quant If pt is not established with a provider, would they like to be referred to a provider to establish care? No .   Dental Screening: Recommended annual dental exams for proper oral hygiene    Community Resource Referral / Chronic Care Management:  CRR required this visit?  No   CCM required this visit?  No     Plan:     I have personally reviewed and noted the following in the patient's chart:   Medical and social history Use of alcohol, tobacco or illicit drugs  Current medications and supplements including opioid prescriptions. Patient is not currently taking opioid prescriptions. Functional ability and status Nutritional status Physical activity Advanced directives List of other  physicians Hospitalizations, surgeries, and ER visits in previous 12 months Vitals Screenings to include cognitive, depression, and falls Referrals and appointments  In addition, I have reviewed and discussed with patient certain preventive protocols, quality metrics, and best practice recommendations. A written personalized care plan for preventive services as well as general preventive health recommendations were provided to patient.     Tillie Rung, LPN   [id number]   After Visit Summary: Given  Nurse Notes: None

## 2023-08-31 ENCOUNTER — Other Ambulatory Visit: Payer: Self-pay | Admitting: Family Medicine

## 2023-08-31 DIAGNOSIS — I10 Essential (primary) hypertension: Secondary | ICD-10-CM

## 2023-09-13 ENCOUNTER — Other Ambulatory Visit: Payer: Self-pay | Admitting: Family Medicine

## 2023-09-13 DIAGNOSIS — E782 Mixed hyperlipidemia: Secondary | ICD-10-CM

## 2023-09-14 ENCOUNTER — Other Ambulatory Visit: Payer: Self-pay | Admitting: Family Medicine

## 2023-09-14 DIAGNOSIS — E782 Mixed hyperlipidemia: Secondary | ICD-10-CM

## 2023-09-28 ENCOUNTER — Other Ambulatory Visit: Payer: Self-pay | Admitting: Family Medicine

## 2023-09-28 DIAGNOSIS — I10 Essential (primary) hypertension: Secondary | ICD-10-CM

## 2023-09-28 DIAGNOSIS — G2581 Restless legs syndrome: Secondary | ICD-10-CM

## 2023-09-29 ENCOUNTER — Other Ambulatory Visit: Payer: Self-pay | Admitting: Family Medicine

## 2023-09-29 DIAGNOSIS — I10 Essential (primary) hypertension: Secondary | ICD-10-CM

## 2023-10-29 ENCOUNTER — Other Ambulatory Visit: Payer: Self-pay | Admitting: Family Medicine

## 2023-10-29 DIAGNOSIS — G2581 Restless legs syndrome: Secondary | ICD-10-CM

## 2023-11-02 ENCOUNTER — Other Ambulatory Visit: Payer: Self-pay | Admitting: Family Medicine

## 2023-11-02 DIAGNOSIS — I10 Essential (primary) hypertension: Secondary | ICD-10-CM

## 2023-11-04 ENCOUNTER — Encounter: Payer: Self-pay | Admitting: Family Medicine

## 2023-11-04 ENCOUNTER — Ambulatory Visit (INDEPENDENT_AMBULATORY_CARE_PROVIDER_SITE_OTHER): Admitting: Family Medicine

## 2023-11-04 VITALS — BP 124/68 | HR 73 | Temp 98.0°F | Resp 16 | Ht 65.0 in | Wt 151.6 lb

## 2023-11-04 DIAGNOSIS — I10 Essential (primary) hypertension: Secondary | ICD-10-CM | POA: Diagnosis not present

## 2023-11-04 DIAGNOSIS — E782 Mixed hyperlipidemia: Secondary | ICD-10-CM | POA: Diagnosis not present

## 2023-11-04 DIAGNOSIS — R7303 Prediabetes: Secondary | ICD-10-CM | POA: Diagnosis not present

## 2023-11-04 DIAGNOSIS — G2581 Restless legs syndrome: Secondary | ICD-10-CM | POA: Diagnosis not present

## 2023-11-04 DIAGNOSIS — I499 Cardiac arrhythmia, unspecified: Secondary | ICD-10-CM

## 2023-11-04 LAB — COMPREHENSIVE METABOLIC PANEL WITH GFR
ALT: 15 U/L (ref 0–35)
AST: 15 U/L (ref 0–37)
Albumin: 4 g/dL (ref 3.5–5.2)
Alkaline Phosphatase: 100 U/L (ref 39–117)
BUN: 19 mg/dL (ref 6–23)
CO2: 24 meq/L (ref 19–32)
Calcium: 8.8 mg/dL (ref 8.4–10.5)
Chloride: 108 meq/L (ref 96–112)
Creatinine, Ser: 0.72 mg/dL (ref 0.40–1.20)
GFR: 77.17 mL/min (ref >60.00)
Glucose, Bld: 100 mg/dL — ABNORMAL HIGH (ref 70–99)
Potassium: 3.8 meq/L (ref 3.5–5.1)
Sodium: 141 meq/L (ref 135–145)
Total Bilirubin: 0.4 mg/dL (ref 0.2–1.2)
Total Protein: 6.9 g/dL (ref 6.0–8.3)

## 2023-11-04 LAB — LIPID PANEL
Cholesterol: 132 mg/dL (ref 0–200)
HDL: 44.3 mg/dL (ref >39.00)
LDL Cholesterol: 61 mg/dL (ref 0–99)
NonHDL: 87.7
Total CHOL/HDL Ratio: 3
Triglycerides: 133 mg/dL (ref 0.0–149.0)
VLDL: 26.6 mg/dL (ref 0.0–40.0)

## 2023-11-04 LAB — CBC
HCT: 39.8 % (ref 36.0–46.0)
Hemoglobin: 13.5 g/dL (ref 12.0–15.0)
MCHC: 33.9 g/dL (ref 30.0–36.0)
MCV: 92.2 fl (ref 78.0–100.0)
Platelets: 287 10*3/uL (ref 150.0–400.0)
RBC: 4.32 Mil/uL (ref 3.87–5.11)
RDW: 13.5 % (ref 11.5–15.5)
WBC: 6.8 10*3/uL (ref 4.0–10.5)

## 2023-11-04 LAB — HEMOGLOBIN A1C: Hgb A1c MFr Bld: 6.3 % (ref 4.6–6.5)

## 2023-11-04 MED ORDER — AMLODIPINE BESYLATE 5 MG PO TABS: 5.0000 mg | ORAL_TABLET | Freq: Every day | ORAL | 3 refills | Status: AC

## 2023-11-04 MED ORDER — PRAVASTATIN SODIUM 20 MG PO TABS: 20.0000 mg | ORAL_TABLET | Freq: Every day | ORAL | 3 refills | Status: AC

## 2023-11-04 MED ORDER — ROPINIROLE HCL 5 MG PO TABS: ORAL_TABLET | ORAL | 3 refills | Status: AC

## 2023-11-04 MED ORDER — LOSARTAN POTASSIUM 50 MG PO TABS: 50.0000 mg | ORAL_TABLET | Freq: Every day | ORAL | 3 refills | Status: AC

## 2023-11-04 MED ORDER — ROPINIROLE HCL 1 MG PO TABS: ORAL_TABLET | ORAL | 2 refills | Status: AC

## 2023-11-04 NOTE — Assessment & Plan Note (Signed)
 Last hemoglobin A1c 6.1 in 08/2022. Encourage consistency with a healthy lifestyle for diabetes prevention. Further recommendation will be given according to hemoglobin A1c result.

## 2023-11-04 NOTE — Patient Instructions (Addendum)
 A few things to remember from today's visit:  Essential (primary) hypertension - Plan: Comprehensive metabolic panel, CBC  Mixed hyperlipidemia - Plan: Comprehensive metabolic panel, Lipid panel  Prediabetes - Plan: Comprehensive metabolic panel, Hemoglobin A1c  Irregular heart rate - Plan: EKG 12-Lead, Comprehensive metabolic panel, TSH, CBC, Ambulatory referral to Cardiology  RESTLESS LEG SYNDROME You could try Requip for restless leg syndrome, instead you could take iron supplementation daily at bedtime. Continue monitoring blood pressure. Monitor heart rate. No changes in the rest of your medications today.  If you need refills for medications you take chronically, please call your pharmacy. Do not use My Chart to request refills or for acute issues that need immediate attention. If you send a my chart message, it may take a few days to be addressed, specially if I am not in the office.  Please be sure medication list is accurate. If a new problem present, please set up appointment sooner than planned today.

## 2023-11-04 NOTE — Assessment & Plan Note (Signed)
Continue pravastatin 20 mg daily and low-fat diet. Further recommendation will be given according to lab results.

## 2023-11-04 NOTE — Assessment & Plan Note (Signed)
 BP adequately controlled, reporting similar readings at home. Continue amlodipine 5 mg daily and losartan 50 mg daily. Continue low-salt/DASH diet. Continue monitoring BP regularly. Eye exam is current. As far as problem is stable, annual follow-up is appropriate.

## 2023-11-04 NOTE — Assessment & Plan Note (Addendum)
 Problem is well-controlled with current regimen but OTC medication also help, recommend either continuing OTC medication or try iron supplementation daily and if these help, in which case she could be able to discontinue Requip. She is afraid of treatment changes, so continue Requip 5 mg daily at bedtime and 1 mg 0.5-1 tab daily as needed during the day.

## 2023-11-04 NOTE — Progress Notes (Unsigned)
 HPI: [name] is a 83 y.o. female  with PMHx significant for hypertension, hyperlipidemia, prediabetes, and restless leg syndrome, who is here today for chronic disease management.  Last seen on 08/12/2022.  Hypertension:  Medications: Amlodipine 5 mg daily, Losartan 50 mg daily,  Side effects: None.  BP readings at home: 120's/70's. She has not been monitoring her pulse along when checking her BP at home.  She had a loop recorder in the past, when she was having presyncopal/syncopal episodes. She states that it was removed many years ago.  Was last seen by cardiology in 2021.  She is UTD with her annual vision exam. Following up with her retinologist annually as well.  Negative for unusual or severe headache, visual changes, exertional chest pain, dyspnea, palpitations, focal weakness, or edema. Today noted irregular HR, she has had PVC's in the past.  Lab Results  Component Value Date   CREATININE 0.85 08/12/2022   BUN 19 08/12/2022   NA 140 08/12/2022   K 3.9 08/12/2022   CL 107 08/12/2022   CO2 22 08/12/2022   Hyperlipidemia: Currently on Pravastatin 20 mg daily.  Side effects from medication: None Has not followed up with cardiology in many years.  Lab Results  Component Value Date   CHOL 159 08/12/2022   HDL 46.70 08/12/2022   LDLCALC 79 08/12/2022   LDLDIRECT 82.0 05/31/2018   TRIG 164.0 (H) 08/12/2022   CHOLHDL 3 08/12/2022   Restless leg syndrome: She is currently taking Requip daily.  No adverse side effects.   States that during recent vacation trip she lost her medication  she tried OTC restful legs*, which helped but concerned about possible arsenic in medication. Negative for erythema or skin lesions.  Review of Systems  Constitutional:  Negative for activity change, appetite change, chills and fever.  HENT:  Negative for nosebleeds and sore throat.   Respiratory:  Negative for cough and wheezing.   Gastrointestinal:  Negative for abdominal  pain, nausea and vomiting.  Endocrine: Negative for cold intolerance and heat intolerance.  Genitourinary:  Negative for decreased urine volume, dysuria and hematuria.  Skin:  Negative for rash.  Neurological:  Negative for dizziness, syncope and facial asymmetry.  Psychiatric/Behavioral:  Negative for confusion and hallucinations.   See other pertinent positives and negatives in HPI.  No current outpatient medications on file prior to visit.   No current facility-administered medications on file prior to visit.   Past Medical History:  Diagnosis Date   DDD (degenerative disc disease), lumbar    Hyperlipidemia    Hypertension    Osteopenia    [facility]   Syncope    Allergies  Allergen Reactions   Sulfonamide Derivatives Rash    REACTION: rash as child   Social History   Socioeconomic History   Marital status: Widowed    Spouse name: Not on file   Number of children: Not on file   Years of education: Not on file   Highest education level: Master's degree (e.g., MA, MS, MEng, MEd, MSW, MBA)  Occupational History   Occupation: Retired Landscape architect, trained in Facilities manager therapy  Tobacco Use   Smoking status: Never   Smokeless tobacco: Never  Substance and Sexual Activity   Alcohol use: Yes    Alcohol/week: 7.0 standard drinks of alcohol    Types: 7 Glasses of wine per week   Drug use: No   Sexual activity: Not on file  Other Topics Concern   Not on file  Social History Narrative   Lives w/ Husband in [place]   Social Drivers of Health   Financial Resource Strain: Low Risk  (11/03/2023)   Overall Financial Resource Strain (CARDIA)    Difficulty of Paying Living Expenses: Not hard at all  Food Insecurity: No Food Insecurity (11/03/2023)   Hunger Vital Sign    Worried About Running Out of Food in the Last Year: Never true    Ran Out of Food in the Last Year: Never true  Transportation Needs: No Transportation Needs (11/03/2023)   PRAPARE - Therapist, art (Medical): No    Lack of Transportation (Non-Medical): No  Physical Activity: Insufficiently Active (11/03/2023)   Exercise Vital Sign    Days of Exercise per Week: 1 day    Minutes of Exercise per Session: 30 min  Stress: No Stress Concern Present (11/03/2023)   [employer] of Occupational Health - Occupational Stress Questionnaire    Feeling of Stress : Not at all  Social Connections: Unknown (11/03/2023)   Social Connection and Isolation Panel [NHANES]    Frequency of Communication with Friends and Family: Patient declined    Frequency of Social Gatherings with Friends and Family: Patient declined    Attends Religious Services: Patient declined    Database administrator or Organizations: Patient declined    Attends Banker Meetings: Patient declined    Marital Status: Widowed   Vitals:   11/04/23 0806 11/04/23 1247  BP: 124/68   Pulse: (!) 41 73  Resp: 16   Temp: 98 F (36.7 C)   SpO2: 97%    Body mass index is 25.23 kg/m.  Physical Exam Vitals and nursing note reviewed.  Constitutional:      General: She is not in acute distress.    Appearance: She is well-developed.  HENT:     Head: Normocephalic and atraumatic.     Mouth/Throat:     Mouth: Mucous membranes are moist.     Pharynx: Oropharynx is clear.  Eyes:     Conjunctiva/sclera: Conjunctivae normal.  Cardiovascular:     Rate and Rhythm: Normal rate. Rhythm irregular.     Pulses:          Dorsalis pedis pulses are 2+ on the right side and 2+ on the left side.     Heart sounds: No murmur heard. Pulmonary:     Effort: Pulmonary effort is normal. No respiratory distress.     Breath sounds: Normal breath sounds.  Abdominal:     Palpations: Abdomen is soft. There is no hepatomegaly or mass.     Tenderness: There is no abdominal tenderness.  Lymphadenopathy:     Cervical: No cervical adenopathy.  Skin:    General: Skin is warm.     Findings: No erythema or rash.   Neurological:     General: No focal deficit present.     Mental Status: She is alert and oriented to person, place, and time.     Cranial Nerves: No cranial nerve deficit.     Gait: Gait normal.  Psychiatric:        Mood and Affect: Mood and affect normal.     ASSESSMENT AND PLAN: Andrea Kelley was seen today for management of chronic diseases.  Orders Placed This Encounter  Procedures   Comprehensive metabolic panel   TSH   CBC   Hemoglobin A1c   Lipid panel   Ambulatory referral to Cardiology   EKG 12-Lead  Prediabetes Assessment & Plan: Last hemoglobin A1c 6.1 in 08/2022. Encourage consistency with a healthy lifestyle for diabetes prevention. Further recommendation will be given according to hemoglobin A1c result.  Orders: -     Comprehensive metabolic panel; Future -     Hemoglobin A1c; Future  Mixed hyperlipidemia Assessment & Plan: Continue pravastatin 20 mg daily and low-fat diet. Further recommendation will be given according to lab results.  Orders: -     Comprehensive metabolic panel; Future -     Lipid panel; Future -     Pravastatin Sodium; Take 1 tablet (20 mg total) by mouth daily. Due for physical/follow up  Dispense: 90 tablet; Refill: 3  Irregular heart rate Noted on examination today, she has history of PVCs. Currently asymptomatic. EKG today:SR with frequent PVC's, LAD,poor R wave progression.  When compared with EKG done in 08/2022 frequent PVCs now present.  In 02/2017 EKG with occasional PVCs. Cardiology referral placed. Clearly instructed about warning signs.  -     EKG 12-Lead -     Comprehensive metabolic panel; Future -     TSH; Future -     CBC; Future -     Ambulatory referral to Cardiology  RESTLESS LEG SYNDROME Assessment & Plan: Problem is well-controlled with current regimen but OTC medication also help, recommend either continuing OTC medication or try iron supplementation daily and if these help, in which case she  could be able to discontinue Requip. She is afraid of treatment changes, so continue Requip 5 mg daily at bedtime and 1 mg 0.5-1 tab daily as needed during the day.  Orders: -     rOPINIRole HCl; TAKE 1/2 TO 1 TABLET BY MOUTH DAILY AS NEEDED  Dispense: 90 tablet; Refill: 2 -     rOPINIRole HCl; TAKE 1 TABLET(5 MG) BY MOUTH AT BEDTIME.  Dispense: 90 tablet; Refill: 3  Essential (primary) hypertension Assessment & Plan: BP adequately controlled, reporting similar readings at home. Continue amlodipine 5 mg daily and losartan 50 mg daily. Continue low-salt/DASH diet. Continue monitoring BP regularly. Eye exam is current. As far as problem is stable, annual follow-up is appropriate.  Orders: -     Comprehensive metabolic panel; Future -     CBC; Future -     amLODIPine Besylate; Take 1 tablet (5 mg total) by mouth daily. DUE FOR FOLLOW UP/PHYSICAL  Dispense: 90 tablet; Refill: 3 -     Losartan Potassium; Take 1 tablet (50 mg total) by mouth daily. Due for physical/follow up  Dispense: 90 tablet; Refill: 3  I spent a total of 40 minutes in both face to face and non face to face activities for this visit on the date of this encounter, excluding the time during which she had her EKG. During this time history was obtained and documented, examination was performed, prior labs/imaging reviewed***, and assessment/plan discussed.  Return in about 1 year (around 11/03/2024) for chronic problems.  I, Isabelle Course, acting as a scribe for Clint Strupp [place], MD., have documented all relevant documentation on the behalf of Danasia Baker [place], MD, as directed by  Remmi Armenteros [place], MD while in the presence of Porschea Borys [place], MD.  I, Jace Fermin [place], MD, have reviewed all documentation for this visit. The documentation on 11/04/23 for the exam, diagnosis, procedures, and orders are all accurate and complete.  Kayela Humphres G. [place], MD  [facility]. [place] office.

## 2023-11-05 ENCOUNTER — Encounter: Payer: Self-pay | Admitting: Family Medicine

## 2023-11-05 LAB — TSH: TSH: 2.11 u[IU]/mL (ref 0.35–5.50)

## 2024-02-01 ENCOUNTER — Encounter (INDEPENDENT_AMBULATORY_CARE_PROVIDER_SITE_OTHER): Payer: Medicare Other | Admitting: Ophthalmology

## 2024-02-01 DIAGNOSIS — H353221 Exudative age-related macular degeneration, left eye, with active choroidal neovascularization: Secondary | ICD-10-CM

## 2024-02-01 DIAGNOSIS — I10 Essential (primary) hypertension: Secondary | ICD-10-CM

## 2024-02-01 DIAGNOSIS — H35033 Hypertensive retinopathy, bilateral: Secondary | ICD-10-CM | POA: Diagnosis not present

## 2024-02-01 DIAGNOSIS — H43813 Vitreous degeneration, bilateral: Secondary | ICD-10-CM

## 2024-02-01 DIAGNOSIS — H353112 Nonexudative age-related macular degeneration, right eye, intermediate dry stage: Secondary | ICD-10-CM | POA: Diagnosis not present

## 2024-02-18 ENCOUNTER — Ambulatory Visit: Payer: Self-pay

## 2024-02-18 NOTE — Telephone Encounter (Signed)
 FYI Only or Action Required?: FYI only for provider.  Patient was last seen in primary care on 11/04/2023 by [place], Betty G, MD.  Called Nurse Triage reporting Leg Pain. Might be swollen  Symptoms began several weeks ago.  Interventions attempted: OTC medications: Aleve.  Symptoms are: unchanged.  Triage Disposition: See PCP When Office is Open (Within 3 Days)  Patient/caregiver understands and will follow disposition?: Yes                  Copied from CRM [id number]. Topic: Clinical - Red Word Triage >> Feb 18, 2024  8:08 AM Charlet HERO wrote: Red Word that prompted transfer to Nurse Triage: Patient is calling about pain in her left leg and foot , says that it is a little swollen. Not sure when it started. Reason for Disposition  [1] MODERATE pain (e.g., interferes with normal activities, limping) AND [2] present > 3 days  Answer Assessment - Initial Assessment Questions 1. ONSET: When did the pain start?      3-4 weeks ago 2. LOCATION: Where is the pain located?      Left leg only 3. PAIN: How bad is the pain?    (Scale 1-10; or mild, moderate, severe)     7-8/10 4. WORK OR EXERCISE: Has there been any recent work or exercise that involved this part of the body?      no 5. CAUSE: What do you think is causing the leg pain?     Thought it might be sciatic pain  - but is now unsure 6. OTHER SYMPTOMS: Do you have any other symptoms? (e.g., chest pain, back pain, breathing difficulty, swelling, rash, fever, numbness, weakness)     Hip pain that went down left leg. Pain is persistent in ankle area  Protocols used: Leg Pain-A-AH

## 2024-02-19 ENCOUNTER — Ambulatory Visit (INDEPENDENT_AMBULATORY_CARE_PROVIDER_SITE_OTHER): Admitting: Family Medicine

## 2024-02-19 VITALS — BP 124/72 | HR 68 | Temp 97.6°F | Ht 65.0 in | Wt 154.6 lb

## 2024-02-19 DIAGNOSIS — M5432 Sciatica, left side: Secondary | ICD-10-CM

## 2024-02-19 MED ORDER — CYCLOBENZAPRINE HCL 10 MG PO TABS: 10.0000 mg | ORAL_TABLET | Freq: Three times a day (TID) | ORAL | 0 refills | Status: AC | PRN

## 2024-02-19 MED ORDER — METHYLPREDNISOLONE 4 MG PO TBPK: ORAL_TABLET | ORAL | 0 refills | Status: AC

## 2024-02-19 NOTE — Progress Notes (Signed)
   Subjective:    Patient ID: Andrea Kelley, female    DOB: [birth year], 84 y.o.   MRN: [id number]  HPI Here for one week of intermittent pains in the left lateral lower leg. No recent trauma. No swelling or warmth or redness. The pain starts when she is on her feet for periods of time and then it goes away when she sits or lies down. She also says she began to have pains in the left lower back and left buttock about 2 months ago, and these pains sometimes shoot down to the leg to the foot. Sometimes she has numbness and tingling in the left leg. Of note she had lumbar spine Xrays on 08-22-20 that showed severe disc space narrowing at L5-S1.   Review of Systems  Constitutional: Negative.   Respiratory: Negative.    Cardiovascular: Negative.   Musculoskeletal:  Positive for arthralgias and back pain.  Neurological:  Positive for numbness. Negative for weakness.       Objective:   Physical Exam Constitutional:      Appearance: Normal appearance.     Comments: Walks normally   Cardiovascular:     Rate and Rhythm: Normal rate and regular rhythm.     Pulses: Normal pulses.     Heart sounds: Normal heart sounds.  Pulmonary:     Effort: Pulmonary effort is normal.     Breath sounds: Normal breath sounds.  Musculoskeletal:     Comments: The left lower leg appears normal with no swelling. No tenderness. She is tender in the left lower back and especially over the left sciatic notch. When I press the sciatic notch area she feels tingling go down the left leg. ROM of the spine is full   Neurological:     Mental Status: She is alert.           Assessment & Plan:  Her left lower leg pains seem to be part of a general sciatica problem. We will treat with a Medrol  dose pack and Flexeril . Rest this weekend. Recheck as needed. Garnette Olmsted, MD

## 2024-02-26 ENCOUNTER — Encounter: Payer: Self-pay | Admitting: Family Medicine

## 2024-03-09 DIAGNOSIS — H43813 Vitreous degeneration, bilateral: Secondary | ICD-10-CM | POA: Diagnosis not present

## 2024-03-09 DIAGNOSIS — Z961 Presence of intraocular lens: Secondary | ICD-10-CM | POA: Diagnosis not present

## 2024-03-09 DIAGNOSIS — H11153 Pinguecula, bilateral: Secondary | ICD-10-CM | POA: Diagnosis not present

## 2024-03-09 DIAGNOSIS — H353132 Nonexudative age-related macular degeneration, bilateral, intermediate dry stage: Secondary | ICD-10-CM | POA: Diagnosis not present

## 2024-04-13 ENCOUNTER — Telehealth: Payer: Self-pay

## 2024-04-13 NOTE — Telephone Encounter (Signed)
 Copied from CRM [id number]. Topic: Clinical - Request for Lab/Test Order >> Apr 13, 2024  5:04 PM Chiquita SQUIBB wrote: Reason for CRM: Patient went to get her covid vaccine and was told she needed a prescription for it. Patient is asking for an order to be sent to the [facility] on [address]. Please advise patient

## 2024-04-14 NOTE — Telephone Encounter (Signed)
 Can you please verify this with her pharmacy, I do not think it is the case. Thanks, BJ

## 2024-04-15 ENCOUNTER — Telehealth: Payer: Self-pay | Admitting: Family Medicine

## 2024-04-15 MED ORDER — COVID-19 MRNA VACC (MODERNA) 50 MCG/0.5ML IM SUSP: 0.5000 mL | Freq: Once | INTRAMUSCULAR | 0 refills | Status: AC

## 2024-04-15 MED ORDER — COVID-19 MRNA VACC (MODERNA) 50 MCG/0.5ML IM SUSP: 0.5000 mL | Freq: Once | INTRAMUSCULAR | 0 refills | Status: DC

## 2024-04-15 NOTE — Telephone Encounter (Signed)
 Rx sent.

## 2024-04-15 NOTE — Addendum Note (Signed)
 Addended by: EVELINE LAURAINE BRAVO on: 04/15/2024 04:12 PM   Modules accepted: Orders

## 2024-04-15 NOTE — Addendum Note (Signed)
 Addended by: EVELINE LAURAINE BRAVO on: 04/15/2024 08:07 AM   Modules accepted: Orders

## 2024-04-15 NOTE — Telephone Encounter (Signed)
 Per sarah the md can only send rx for spikevax (Covid) the only on in epic the pharm will  be advise

## 2024-04-18 ENCOUNTER — Telehealth: Payer: Self-pay | Admitting: *Deleted

## 2024-04-18 ENCOUNTER — Other Ambulatory Visit: Payer: Self-pay

## 2024-04-18 MED ORDER — COVID-19MRNA BIVAL VACC PFIZER 30 MCG/0.3ML IM SUSP: 0.3000 mL | Freq: Once | INTRAMUSCULAR | 0 refills | Status: AC

## 2024-04-18 NOTE — Telephone Encounter (Signed)
**Note De-identified  [place] Obfuscation** Please advise 

## 2024-04-18 NOTE — Telephone Encounter (Signed)
 Patient called back because that particular rx pcp is not in stock at her pharmacy. She needs to know if she can receive a different covid vaccine before she travels. Please call patient back and advise.

## 2024-04-18 NOTE — Telephone Encounter (Signed)
 Spoke to [facility] from [facility]. He inform that they don't Moderna Spikevax vaccine but do have pfizer. He states they cannot take verbal order and has to fax or sent electronically. Review pt's chart, pt have had pfizer in the past. [facility] and will fax it over as well.   Inform pt. Pt is aware.   Forwarding to PCP.

## 2024-04-18 NOTE — Telephone Encounter (Signed)
 Copied from CRM [id number]. Topic: Clinical - Request for Lab/Test Order >> Apr 13, 2024  5:04 PM Andrea Kelley wrote: Reason for CRM: Patient went to get her covid vaccine and was told she needed a prescription for it. Patient is asking for [facility] to [facility] to [facility] [address]. Please advise patient >> Apr 15, 2024  5:19 PM [name] wrote: Patient states that pharmacy doesn't have COVID-19 mRNA Virus Vaccine 50 MCG/0.5ML Spikevax injection and she wants to know where she can get it. She states that she is traveling out of town.

## 2024-07-28 ENCOUNTER — Telehealth: Payer: Self-pay | Admitting: Family Medicine

## 2024-07-28 NOTE — Telephone Encounter (Signed)
 Spoke with patient to schedule AWV.  She stated she has moved to [place]
